# Patient Record
Sex: Male | Born: 1940 | Race: White | Hispanic: No | Smoking: Former smoker
Health system: Southern US, Community
[De-identification: ages and names within clinical notes are randomized; demographics above are authoritative.]

## PROBLEM LIST (undated history)

## (undated) DIAGNOSIS — I10 Essential (primary) hypertension: Secondary | ICD-10-CM

## (undated) DIAGNOSIS — I509 Heart failure, unspecified: Secondary | ICD-10-CM

## (undated) DIAGNOSIS — M1990 Unspecified osteoarthritis, unspecified site: Secondary | ICD-10-CM

## (undated) DIAGNOSIS — I4891 Unspecified atrial fibrillation: Secondary | ICD-10-CM

## (undated) DIAGNOSIS — N400 Enlarged prostate without lower urinary tract symptoms: Secondary | ICD-10-CM

## (undated) DIAGNOSIS — I2510 Atherosclerotic heart disease of native coronary artery without angina pectoris: Secondary | ICD-10-CM

## (undated) DIAGNOSIS — I219 Acute myocardial infarction, unspecified: Secondary | ICD-10-CM

## (undated) DIAGNOSIS — I739 Peripheral vascular disease, unspecified: Secondary | ICD-10-CM

## (undated) DIAGNOSIS — I255 Ischemic cardiomyopathy: Secondary | ICD-10-CM

## (undated) HISTORY — DX: Benign prostatic hyperplasia without lower urinary tract symptoms: N40.0

## (undated) HISTORY — DX: Atherosclerotic heart disease of native coronary artery without angina pectoris: I25.10

## (undated) HISTORY — PX: CORONARY ANGIOPLASTY: SHX604

## (undated) HISTORY — DX: Unspecified atrial fibrillation: I48.91

## (undated) HISTORY — PX: CATARACT EXTRACTION W/ INTRAOCULAR LENS  IMPLANT, BILATERAL: SHX1307

## (undated) HISTORY — PX: BACK SURGERY: SHX140

---

## 1985-05-27 HISTORY — PX: CORONARY ARTERY BYPASS GRAFT: SHX141

## 2001-04-16 ENCOUNTER — Encounter: Payer: Self-pay | Admitting: Neurosurgery

## 2001-04-20 ENCOUNTER — Ambulatory Visit (HOSPITAL_COMMUNITY): Admission: RE | Admit: 2001-04-20 | Discharge: 2001-04-21 | Payer: Self-pay | Admitting: Neurosurgery

## 2001-04-20 ENCOUNTER — Encounter: Payer: Self-pay | Admitting: Neurosurgery

## 2001-05-14 ENCOUNTER — Encounter (HOSPITAL_COMMUNITY): Admission: RE | Admit: 2001-05-14 | Discharge: 2001-06-13 | Payer: Self-pay | Admitting: Neurosurgery

## 2001-09-16 ENCOUNTER — Encounter: Payer: Self-pay | Admitting: Neurosurgery

## 2001-09-16 ENCOUNTER — Ambulatory Visit (HOSPITAL_COMMUNITY): Admission: RE | Admit: 2001-09-16 | Discharge: 2001-09-16 | Payer: Self-pay | Admitting: Neurosurgery

## 2002-06-03 ENCOUNTER — Encounter: Payer: Self-pay | Admitting: Cardiology

## 2009-01-05 ENCOUNTER — Ambulatory Visit (HOSPITAL_COMMUNITY): Admission: RE | Admit: 2009-01-05 | Discharge: 2009-01-05 | Payer: Self-pay | Admitting: Ophthalmology

## 2009-03-29 ENCOUNTER — Ambulatory Visit (HOSPITAL_COMMUNITY): Admission: RE | Admit: 2009-03-29 | Discharge: 2009-03-29 | Payer: Self-pay | Admitting: Ophthalmology

## 2010-01-31 ENCOUNTER — Encounter: Payer: Self-pay | Admitting: Cardiology

## 2010-07-20 ENCOUNTER — Encounter: Payer: Self-pay | Admitting: Cardiology

## 2010-07-24 ENCOUNTER — Encounter: Payer: Self-pay | Admitting: Cardiology

## 2010-07-24 DIAGNOSIS — R072 Precordial pain: Secondary | ICD-10-CM

## 2010-08-14 ENCOUNTER — Encounter: Payer: Self-pay | Admitting: *Deleted

## 2010-08-14 ENCOUNTER — Ambulatory Visit: Payer: Medicare Other | Admitting: Cardiology

## 2010-08-14 ENCOUNTER — Encounter (INDEPENDENT_AMBULATORY_CARE_PROVIDER_SITE_OTHER): Payer: Medicare Other | Admitting: Internal Medicine

## 2010-08-14 ENCOUNTER — Ambulatory Visit (INDEPENDENT_AMBULATORY_CARE_PROVIDER_SITE_OTHER): Payer: Medicare Other | Admitting: Cardiology

## 2010-08-14 ENCOUNTER — Encounter: Payer: Self-pay | Admitting: Cardiology

## 2010-08-14 ENCOUNTER — Encounter: Payer: Self-pay | Admitting: Internal Medicine

## 2010-08-14 VITALS — BP 136/84 | HR 80 | Resp 14

## 2010-08-14 DIAGNOSIS — I255 Ischemic cardiomyopathy: Secondary | ICD-10-CM | POA: Insufficient documentation

## 2010-08-14 DIAGNOSIS — I2589 Other forms of chronic ischemic heart disease: Secondary | ICD-10-CM

## 2010-08-14 DIAGNOSIS — E7800 Pure hypercholesterolemia, unspecified: Secondary | ICD-10-CM

## 2010-08-14 DIAGNOSIS — I2510 Atherosclerotic heart disease of native coronary artery without angina pectoris: Secondary | ICD-10-CM

## 2010-08-14 DIAGNOSIS — I25110 Atherosclerotic heart disease of native coronary artery with unstable angina pectoris: Secondary | ICD-10-CM | POA: Insufficient documentation

## 2010-08-14 DIAGNOSIS — R0989 Other specified symptoms and signs involving the circulatory and respiratory systems: Secondary | ICD-10-CM

## 2010-08-14 DIAGNOSIS — R9439 Abnormal result of other cardiovascular function study: Secondary | ICD-10-CM

## 2010-08-14 MED ORDER — ATORVASTATIN CALCIUM 40 MG PO TABS: 40.0000 mg | ORAL_TABLET | Freq: Every day | ORAL | Status: DC

## 2010-08-14 MED ORDER — CARVEDILOL 3.125 MG PO TABS: 3.1250 mg | ORAL_TABLET | Freq: Two times a day (BID) | ORAL | Status: DC

## 2010-08-14 MED ORDER — NITROGLYCERIN 0.4 MG SL SUBL: 0.4000 mg | SUBLINGUAL_TABLET | SUBLINGUAL | Status: DC | PRN

## 2010-08-14 NOTE — Patient Instructions (Addendum)
Your physician has requested that you have a cardiac catheterization. Cardiac catheterization is used to diagnose and/or treat various heart conditions. Doctors may recommend this procedure for a number of different reasons. The most common reason is to evaluate chest pain. Chest pain can be a symptom of coronary artery disease (CAD), and cardiac catheterization can show whether plaque is narrowing or blocking your heart's arteries. This procedure is also used to evaluate the valves, as well as measure the blood flow and oxygen levels in different parts of your heart. For further information please visit [email or web address] Please follow instruction sheet, as given. START CARVEDILOL 3.125MG  ONE TABLET TWICE DAILY START LIPITOR 40MG  ONCE DAILY RETURN FOR FASTING LAB WORK IN 6 WEEKS=LIPID/LIVER=272.0/V58.69 Your physician has requested that you have a lower extremity arterial duplex. This test is an ultrasound of the arteries in the legs or arms. It looks at arterial blood flow in the legs and arms. Allow one hour for Lower and Upper Arterial scans. There are no restrictions or special instructions

## 2010-08-14 NOTE — Assessment & Plan Note (Addendum)
As noted.  Plan cath.  Continue ASA.  Start coreg and give NTG prn.  Start Lipitor 40 mg daily.  Will need to check lipids.

## 2010-08-14 NOTE — Assessment & Plan Note (Signed)
Proceed with cath as noted.  Start beta blocker.  Eventually start ACE.  Will eventually need echo to further assess LVF.

## 2010-08-14 NOTE — Progress Notes (Signed)
History of Present Illness: 70 year old male, ex-smoker, with a history of coronary artery disease, status post CABG in 82.  He was admitted to [facility] 07/20/10 with chest discomfort.  He ruled out for myocardial infarction by enzymes.  Creatinine was normal at 0.94, alkaline phosphatase is elevated at 94, total bilirubin was elevated at 1.3, d-dimer was negative, BNP was normal at 97, hemoglobin was normal at 14.2.  Chest x-ray was normal.  He was set up for an outpatient stress test.  This was on February 28.  It demonstrated 1 mm ST segment depression in inferior leads during exercise.  Images demonstrated an EF of 37%, moderate global hypokinesis, large partially reversible, apical to basal-anterior/anteroseptal defect associated with severe hypokinesis, consistent with ischemia in addition to prior infarct.  There was also a medium fixed, mid to basal inferior defect associated with moderate hypokinesis.  He had recurrent chest symptoms last night and contacted his primary care provider.  He was added on to be seen today.  He noted some chest pressure that radiated up into his neck while out walking last month.  This prompted him to go to the hospital.  Since then he's basically been pain-free.  Last night, while bringing up a trash can, he developed recurrent chest pressure with radiation to his neck and associated shortness of breath.  He denies syncope, associated nausea or diaphoresis.  He denies any rest symptoms.  Although, he does report some symptoms of acid reflux.  He feels that these are clearly different symptoms than his anginal symptoms and he has noted relief with anti-acid.  Past Medical History  Diagnosis Date  . Chest pain   . Chronic headaches   . CAD (coronary artery disease)    Past Surgical History  Procedure Date  . Coronary artery bypass graft 1987    [place], [place]     Current Outpatient Prescriptions  Medication Sig Dispense Refill  . Aspirin (ASPIR-81 PO)  Take by mouth daily.        . fish oil-omega-3 fatty acids 1000 MG capsule Take 2 g by mouth daily.        . Multiple Vitamins-Minerals (CENTRUM SILVER PO) Take 2 tablets by mouth daily.          No Known Allergies  History  Substance Use Topics  . Smoking status: Former Smoker    Types: Cigarettes  . Smokeless tobacco: Never Used  . Alcohol Use: No   No family history on file.  ROS:  Please see history of present illness.  He denies any fevers, chills, cough, melena, hematochezia, weight changes, skin changes, her changes.  All other systems reviewed are negative.  Vital Signs: BP 136/84  Pulse 80  Resp 14  PHYSICAL EXAM: Well nourished, well developed, in no acute distress HEENT: normal Endocrine: No thyromegaly Lymphatics: No adenopathy Neck: no JVD Cardiac:  normal S1, S2; RRR; no murmur Lungs:  clear to auscultation bilaterally, no wheezing, rhonchi or rales Abd: soft, nontender, no hepatomegaly, No bruits Ext: no edema Skin: warm and dry Neuro:  CNs 2-12 intact, no focal abnormalities noted Vascular: No carotid bruits bilaterally; positive right femoral artery bruit  EKG:  Normal sinus rhythm, normal axis, heart rate 78, poor R-wave progression, no ischemic changes.

## 2010-08-14 NOTE — Assessment & Plan Note (Signed)
The patient requires cardiac catheterization.  Risks and benefits have been discussed with the patient and he agrees to proceed.  He will continue aspirin.  He will be given nitroglycerin.  We'll also start beta blocker.  Given his LV dysfunction, he will be placed on carvedilol.

## 2010-08-14 NOTE — Assessment & Plan Note (Signed)
Arrange ABIs after cath. completed.

## 2010-08-15 LAB — BASIC METABOLIC PANEL
BUN: 14 mg/dL (ref 6–23)
Chloride: 104 mEq/L (ref 96–112)
Creatinine, Ser: 1 mg/dL (ref 0.4–1.5)
Glucose, Bld: 98 mg/dL (ref 70–99)
Potassium: 4 mEq/L (ref 3.5–5.1)

## 2010-08-15 LAB — CBC WITH DIFFERENTIAL/PLATELET
Basophils Absolute: 0 10*3/uL (ref 0.0–0.1)
Eosinophils Absolute: 0.2 10*3/uL (ref 0.0–0.7)
HCT: 43.2 % (ref 39.0–52.0)
Lymphs Abs: 2.3 10*3/uL (ref 0.7–4.0)
MCV: 95.8 fl (ref 78.0–100.0)
Monocytes Absolute: 0.2 10*3/uL (ref 0.1–1.0)
Neutrophils Relative %: 70.5 % (ref 43.0–77.0)
Platelets: 309 10*3/uL (ref 150.0–400.0)
RDW: 12.6 % (ref 11.5–14.6)
WBC: 9.4 10*3/uL (ref 4.5–10.5)

## 2010-08-15 LAB — PROTIME-INR: Prothrombin Time: 13.5 s — ABNORMAL HIGH (ref 10.2–12.4)

## 2010-08-16 ENCOUNTER — Inpatient Hospital Stay (HOSPITAL_BASED_OUTPATIENT_CLINIC_OR_DEPARTMENT_OTHER)
Admission: RE | Admit: 2010-08-16 | Discharge: 2010-08-16 | Disposition: A | Discharge status: Home / Self Care | Payer: Medicare Other | Source: Ambulatory Visit | Attending: Cardiology | Admitting: Cardiology

## 2010-08-16 DIAGNOSIS — I2119 ST elevation (STEMI) myocardial infarction involving other coronary artery of inferior wall: Secondary | ICD-10-CM

## 2010-08-16 DIAGNOSIS — I2510 Atherosclerotic heart disease of native coronary artery without angina pectoris: Secondary | ICD-10-CM

## 2010-08-16 DIAGNOSIS — I209 Angina pectoris, unspecified: Secondary | ICD-10-CM | POA: Insufficient documentation

## 2010-08-16 DIAGNOSIS — I25810 Atherosclerosis of coronary artery bypass graft(s) without angina pectoris: Secondary | ICD-10-CM | POA: Insufficient documentation

## 2010-08-20 ENCOUNTER — Encounter: Payer: Self-pay | Admitting: Cardiology

## 2010-08-21 ENCOUNTER — Telehealth: Payer: Self-pay | Admitting: *Deleted

## 2010-08-21 NOTE — Telephone Encounter (Signed)
Message copied by Deliah Goody on Tue Aug 21, 2010  4:08 PM ------      Message from: Olga Millers      Created: Mon Aug 20, 2010  5:36 PM       ok

## 2010-08-21 NOTE — Telephone Encounter (Signed)
pt aware of results  

## 2010-08-22 NOTE — Progress Notes (Signed)
This encounter was created in error - please disregard.

## 2010-08-23 NOTE — Procedures (Signed)
NAME:  Hunter Townsend, WESTLING NO.:  [id number]  MEDICAL RECORD NO.:  [id number]           PATIENT TYPE:  LOCATION:                                 FACILITY:  PHYSICIAN:  Sarika Baldini M. [place], M.D.       DATE OF BIRTH:  DATE OF PROCEDURE:  08/16/2010 DATE OF DISCHARGE:                           CARDIAC CATHETERIZATION   INDICATIONS FOR PROCEDURE:  The patient is a 70 year old white male with history of remote coronary artery bypass surgery in 42 while in [place].  He presents recently with increased anginal symptoms.  A stress Cardiolite study showed evidence of anterior and anterior septal defect, which was partially reversible.  There was also a fixed inferior wall defect.  Ejection fraction was 37%.  Coronary angiography was recommended.  Of note, the patient reports that he had 4 bypass grafts at the time of his surgery, but we have no records concerning this operation, and the patient has no graft markers in place.PROCEDURE:  Left heart catheterization, coronary and left ventricular angiography, saphenous vein graft angiography, LIMA graft angiography, and RIMA angiography.  Access is via the right femoral artery using standard Seldinger technique.  EQUIPMENT:  4-French 4 cm left Judkins catheter, 4-French 3-D RCA catheter, 4-French pigtail catheter, 4-French LCB catheter, 4-French RCB catheter, 4-French left Amplatz one catheter.  CONTRAST:  165 mL of Omnipaque.  HEMODYNAMIC DATA:  Aortic pressure was 138/69 with a mean of 96 mmHg. Left ventricular pressure was 135 with an EDP of 18 mmHg.  ANGIOGRAPHIC DATA:  The left coronary artery arises and distributes normally.  The left main coronary artery has calcified plaque in the distal vessel with less than 20% irregularity.  The left anterior descending artery is heavily calcified.  It is occluded at its ostium.  The left circumflex coronary artery gives rise to a single large obtuse marginal vessel that  trifurcates into 3 moderate-sized branches.  There are mild irregularities in the proximal marginal less than 30%.  After giving rise to the first branch, there is a 50% to 60% stenosis in the mid OM.  The continuation of the left circumflex in the AV groove gives rise to excellent left-to-right collaterals to the entire right coronary artery.  The right coronary artery arises normally.  It is occluded immediately after the comments branch.  The LIMA graft to the LAD is widely patent with excellent runoff.  This fills the entire LAD which wraps around the apex.  We were unable to visualize any vein grafts coming off the aorta.  We did get into a knob somewhat posteriorly that appeared to be an old graft to the right coronary artery.  This was occluded.  Aortography demonstrated no evidence of vein grafts.  We did inject the right innominate and visualized the right internal mammary artery, which was not used as a graft, but was patent.  Left ventricular angiography was performed in the RAO view.  This demonstrates moderate anterior wall hypokinesia with global hypokinesia as well and overall ejection fraction of 40%.  FINAL INTERPRETATION: 1. Severe two-vessel obstructive atherosclerotic coronary artery     disease involving the occlusion of  the LAD and right coronary     artery. 2. Patent LIMA graft to the LAD. 3. Excellent collaterals to the entire right coronary artery in the     left circumflex coronary artery. 4. Moderate left ventricular dysfunction. 5. Occlusion of all saphenous vein grafts.  PLAN:  We would recommend continued medical therapy.          ______________________________ Gabor Lusk M. [place], M.D.     PMJ/MEDQ  D:  08/16/2010  T:  08/17/2010  Job:  [id number]  cc:   Madolyn Frieze. Jens Som, MD, [facility] Learta Codding, MD,FACC  Electronically Signed by Eliya Geiman [place] M.D. on 08/23/2010 01:05:31 PM

## 2010-09-02 LAB — BASIC METABOLIC PANEL
BUN: 14 mg/dL (ref 6–23)
Chloride: 104 mEq/L (ref 96–112)
Creatinine, Ser: 0.86 mg/dL (ref 0.4–1.5)
GFR calc non Af Amer: >60 mL/min (ref >60)
Glucose, Bld: 129 mg/dL — ABNORMAL HIGH (ref 70–99)
Potassium: 4.1 mEq/L (ref 3.5–5.1)

## 2010-09-05 ENCOUNTER — Encounter: Payer: Self-pay | Admitting: Cardiology

## 2010-09-05 ENCOUNTER — Ambulatory Visit (INDEPENDENT_AMBULATORY_CARE_PROVIDER_SITE_OTHER): Payer: Medicare Other | Admitting: Cardiology

## 2010-09-05 VITALS — BP 138/67 | HR 68 | Ht 65.0 in | Wt 180.8 lb

## 2010-09-05 DIAGNOSIS — I2589 Other forms of chronic ischemic heart disease: Secondary | ICD-10-CM

## 2010-09-05 DIAGNOSIS — I2510 Atherosclerotic heart disease of native coronary artery without angina pectoris: Secondary | ICD-10-CM

## 2010-09-05 DIAGNOSIS — I739 Peripheral vascular disease, unspecified: Secondary | ICD-10-CM

## 2010-09-05 DIAGNOSIS — I255 Ischemic cardiomyopathy: Secondary | ICD-10-CM

## 2010-09-05 DIAGNOSIS — E782 Mixed hyperlipidemia: Secondary | ICD-10-CM

## 2010-09-05 DIAGNOSIS — Z79899 Other long term (current) drug therapy: Secondary | ICD-10-CM

## 2010-09-05 NOTE — Assessment & Plan Note (Signed)
Possible diagnosis. Patient is describing some tingling and leg discomfort when he is walking, sometimes with standing. Could be a neuropathic component. I encouraged him to get back to his walking regimen gradually. We did discuss proceeding with ABIs if his symptoms persist.

## 2010-09-05 NOTE — Assessment & Plan Note (Signed)
No clear diagnosis, although placed on Lipitor previously, not able to tolerate. Plan followup fasting lipid profile to review. We may consider a different statin preparation

## 2010-09-05 NOTE — Patient Instructions (Signed)
Your physician wants you to follow-up in: 3 months. You will receive a reminder letter in the mail one-two months in advance. If you don't receive a letter, please call our office to schedule the follow-up appointment. Your physician recommends that you continue on your current medications as directed. Please refer to the Current Medication list given to you today. Your physician recommends that you go to the [facility] for a FASTING lipid profile and liver function labs. Do not eat or drink after midnight. If the results of your test are normal or stable, you will receive a letter. If they are abnormal, the nurse will contact you by phone.

## 2010-09-05 NOTE — Progress Notes (Signed)
FYI:  Wife just passed 5 months ago. --agh

## 2010-09-05 NOTE — Progress Notes (Signed)
Clinical Summary Hunter Townsend is a 70 y.o.male presenting for office followup. This is my first meeting with the patient. He presented to [facility] ER in late February with chest pain, had negative cardiac markers, and was discharged to follow up with Dr. Dimas Aguas. It seems that a followup Cardiolite was arranged as an outpatient, performed on 28 February, and read by Dr. Andee Lineman. The study was significantly abnormal, as noted below. Ultimately the patient was apparently then seen in our [facility] office by Dr. Jens Som in late March. There is no note for me to review at this time however. I did locate a cardiac catheterization report from 23 March, also noted below, showing 2 vessel CAD, patent LIMA to the LAD, and occluded vein grafts. The patient was managed medically.  Cardiac history is outlined below. He states that he has done fairly well over the years, with symptoms noted when he tried to get back to a walking regimen after not doing exercise for a year. His wife passed away within the last 6 months of cancer, and he has not been exercising until just recently.   He states that he has been feeling better, on medical therapy outlined. Was not able to tolerate Lipitor related to significant leg weakness.  Lab work from 20 March showed hemoglobin 15, platelets 309, INR 1.2, potassium 4.0, BUN 14, creatinine 1.0.  He does report tingling and some tightness in his legs, possibly related to claudication, although he does have a history of back surgery and some of his symptoms could be neuropathic.  No recent followup lipid profile.   No Known Allergies  Current outpatient prescriptions:Aspirin (ASPIR-81 PO), Take by mouth daily.  , Disp: , Rfl: ;  carvedilol (COREG) 3.125 MG tablet, Take 1 tablet (3.125 mg total) by mouth 2 (two) times daily., Disp: 60 tablet, Rfl: 11;  fish oil-omega-3 fatty acids 1000 MG capsule, Take 2 g by mouth daily.  , Disp: , Rfl: ;  isosorbide mononitrate (IMDUR) 30 MG 24 hr  tablet, Take 30 mg by mouth daily.  , Disp: , Rfl:  LORazepam (ATIVAN) 1 MG tablet, Take 1 tab nightly as needed for sleep , Disp: , Rfl: ;  Multiple Vitamins-Minerals (CENTRUM SILVER PO), Take 1 tablet by mouth daily. , Disp: , Rfl: ;  nitroGLYCERIN (NITROSTAT) 0.4 MG SL tablet, Place 1 tablet (0.4 mg total) under the tongue every 5 (five) minutes as needed for chest pain., Disp: 100 tablet, Rfl: 3 DISCONTD: atorvastatin (LIPITOR) 40 MG tablet, Take 1 tablet (40 mg total) by mouth daily., Disp: 30 tablet, Rfl: 11  Past Medical History  Diagnosis Date  . BPH (benign prostatic hyperplasia)   . CAD (coronary artery disease)     Multivessel, LIMA to LAD patent, SVGs (occluded,), collaterals to RCA, LVEF 40%    Social History Mr. Pozo reports that he quit smoking about 17 years ago. His smoking use included Cigarettes. He has never used smokeless tobacco. Mr. Maertens reports that he does not drink alcohol.  Review of Systems As noted above, otherwise reviewed and negative.  Physical Examination Filed Vitals:   09/05/10 0952  BP: 138/67  Pulse: 68  Normally nourished appearing male, no acute distress. HEENT: Conjunctiva and lids normal, oropharynx with moist mucosa. Neck: Supple, no elevated JVP or bruits. No thyromegaly. Lungs: Clear auscultation, nonlabored. Cardiac: Regular rate and rhythm, soft systolic murmur, split S2, no gallop. Abdomen: Soft, nontender, bowel sounds present. Skin: Warm and dry. Extremities: No pitting edema. Distal pulses 1+. Musculoskeletal:  No kyphosis. Neuropsychiatric: Alert and oriented x3, affect appropriate.  Studies Exercise Cardiolite 07/24/2010: Abnormal ST segment changes with baseline evidence of previous anteroseptal infarct. LVEF 37%. Large, partially reversible, apical the basal anterior/anteroseptal defect, as well as a medium, fixed mid to basal inferior defect.  Cardiac catheterization 08/17/2010: The left coronary artery arises and  distributes   normally.  The left main coronary artery has calcified plaque in the   distal vessel with less than 20% irregularity.      The left anterior descending artery is heavily calcified.  It is   occluded at its ostium.      The left circumflex coronary artery gives rise to a single large obtuse   marginal vessel that trifurcates into 3 moderate-sized branches.  There   are mild irregularities in the proximal marginal less than 30%.  After   giving rise to the first branch, there is a 50% to 60% stenosis in the   mid OM.  The continuation of the left circumflex in the AV groove gives   rise to excellent left-to-right collaterals to the entire right coronary   artery.      The right coronary artery arises normally.  It is occluded immediately   after the comments branch.      The LIMA graft to the LAD is widely patent with excellent runoff.  This   fills the entire LAD which wraps around the apex.      We were unable to visualize any vein grafts coming off the aorta.  We   did get into a knob somewhat posteriorly that appeared to be an old   graft to the right coronary artery.  This was occluded.  Aortography   demonstrated no evidence of vein grafts.  We did inject the right   innominate and visualized the right internal mammary artery, which was   not used as a graft, but was patent.      Left ventricular angiography was performed in the RAO view.  This   demonstrates moderate anterior wall hypokinesia with global hypokinesia   as well and overall ejection fraction of 40%.   Problem List and Plan

## 2010-09-05 NOTE — Assessment & Plan Note (Signed)
2 vessel obstructive disease by recent angiography, patent LIMA to LAD, and collateral filling of the occluded RCA, nonobstructive disease in the circumflex. Continue medical therapy and gradually increase activity.

## 2010-09-05 NOTE — Assessment & Plan Note (Signed)
LVEF approximately 40% by recent angiography, continue medical therapy.

## 2010-10-12 NOTE — Op Note (Signed)
[place]  Patient:    Hunter Townsend, CAPTAIN Visit Number: [id number] MRN: [id number]          Service Type: DSU Location: [id number] Attending Physician:  Gerald Dexter Dictated by:   Reinaldo Meeker, M.D. Proc. Date: 04/20/01 Admit Date:  04/20/2001                             Operative Report  PREOPERATIVE DIAGNOSIS:  Herniated disk L4-5 right, foraminal.  POSTOPERATIVE DIAGNOSIS:  Herniated disk L4-5 right, foraminal.  OPERATION PERFORMED:  Right L4-5 interlaminar laminotomy for excision of herniated disk with operating microscope.  SECONDARY PROCEDURE:  Microdissection L4 and L5 nerve roots and L4-5 disk and repair of dural tear.  SURGEON:  Reinaldo Meeker, M.D.  ANESTHESIA:  DESCRIPTION OF PROCEDURE:  After being placed in prone position, the patients back was prepped and draped in the usual sterile fashion.  Localizing x-ray was taken prior to incision to identify the appropriate level.  Midline incision was made above the spinous processes of L4 and L5.  Using the Bovie cutting current, the incision was carried down to the spinous processes. Subperiosteal dissection was then carried out on the right-sided spinous process and lamina and the McCullough self-retaining retractor was placed for exposure.  A second x-ray was taken to confirm approach to the L4-5 level and this was correct.  Using a high speed drill the inferior two thirds of the L4 lamina and the medial one third of the facet joint were removed. ____________ removal of bone towards the fragment was carried out as well. Ligamentum flavum was removed in a piecemeal fashion.  In the course of removing bone and the ligamentum flavum, a dural tear was formed.  This was repaired with one figure-of-eight 5-0 Prolene stitch.  Tisseal was placed on the area at the end of the case as well.  At this point microdissection technique was used to identify the lateral aspect  of the L4 nerve root, thecal sac and L5 nerve root.  Further coagulation was carried down the floor of the canal to identify the L4-5 disk.  The annulus was incised with a 15 blade and the disk space clean out with pituitary rongeurs and curets.  Inspection of the foramen slightly superior to the L4 nerve root revealed a large amount of subligamentous disk herniation material.  this was removed thoroughly and gave excellent decompression of the L4 nerve root.  At this point inspection was carried out in all directions for any evidence of residual compression and none could be identified.  Large amounts of irrigation were carried out and any bleeding controlled with bipolar coagulation and Gelfoam.  The wound was then closed using interrupted Vicryl on the muscle, fascia, subcutaneous and subcuticular tissues and Dermabond and Steri-Strips on the skin.  A sterile dressing was then applied and the patient was then extubated and taken to the recovery room in stable condition. Dictated by:   Reinaldo Meeker, M.D. Attending Physician:  Gerald Dexter DD:  04/20/01 TD:  04/20/01 Job: [id number]

## 2011-04-05 ENCOUNTER — Encounter: Payer: Self-pay | Admitting: Cardiology

## 2011-04-05 ENCOUNTER — Ambulatory Visit (INDEPENDENT_AMBULATORY_CARE_PROVIDER_SITE_OTHER): Payer: Medicare Other | Admitting: Cardiology

## 2011-04-05 VITALS — BP 150/87 | HR 83 | Ht 65.0 in | Wt 179.0 lb

## 2011-04-05 DIAGNOSIS — E782 Mixed hyperlipidemia: Secondary | ICD-10-CM

## 2011-04-05 DIAGNOSIS — I2510 Atherosclerotic heart disease of native coronary artery without angina pectoris: Secondary | ICD-10-CM

## 2011-04-05 DIAGNOSIS — I2589 Other forms of chronic ischemic heart disease: Secondary | ICD-10-CM

## 2011-04-05 DIAGNOSIS — I739 Peripheral vascular disease, unspecified: Secondary | ICD-10-CM

## 2011-04-05 DIAGNOSIS — I255 Ischemic cardiomyopathy: Secondary | ICD-10-CM

## 2011-04-05 DIAGNOSIS — I10 Essential (primary) hypertension: Secondary | ICD-10-CM | POA: Insufficient documentation

## 2011-04-05 MED ORDER — RAMIPRIL 5 MG PO CAPS: 5.0000 mg | ORAL_CAPSULE | Freq: Every day | ORAL | Status: DC

## 2011-04-05 NOTE — Progress Notes (Signed)
Clinical Summary [name] is a 70 y.o.male presenting for followup. He was seen back in April. He presents to the office after having recent symptoms of leg discomfort described as a hot tingling. This occurred originally after he took a routine walk, and has been less noticeable since that time. Also states that his blood pressure has been elevated. He reports compliance with his medications. No active angina symptoms.  Lab work from April showed AST 28, ALT 49, triglycerides 77, cholesterol 124, HDL 32, LDL 77. This was noted off statin therapy, prior intolerance to Lipitor.  Today we reviewed his medications, discussed workup for possible PAD, and also modifications for further afterload reduction and blood pressure control.  No Known Allergies  Medication list reviewed.  Past Medical History  Diagnosis Date  . BPH (benign prostatic hyperplasia)   . CAD (coronary artery disease)     Multivessel, LIMA to LAD patent, SVGs (occluded,), collaterals to RCA, LVEF 40%    Past Surgical History  Procedure Date  . Coronary artery bypass graft 1987    [place], [place] - LIMA to LAD, SVG to RCA presumably  . Back surgery     Social History Mr. Leatham reports that he quit smoking about 17 years ago. His smoking use included Cigarettes. He has a 80 pack-year smoking history. He has never used smokeless tobacco. Mr. Kopko reports that he does not drink alcohol.  Review of Systems No palpitations or dizziness. No orthopnea or PND. Otherwise negative.  Physical Examination Filed Vitals:   04/05/11 1447  BP: 150/87  Pulse: 83    Normally nourished appearing male, no acute distress.  HEENT: Conjunctiva and lids normal, oropharynx with moist mucosa.  Neck: Supple, no elevated JVP or bruits. No thyromegaly.  Lungs: Clear auscultation, nonlabored.  Cardiac: Regular rate and rhythm, soft systolic murmur, split S2, no gallop.  Abdomen: Soft, nontender, bowel sounds present.  Skin: Warm and  dry.  Extremities: No pitting edema. Distal pulses 1+.  Musculoskeletal: No kyphosis.  Neuropsychiatric: Alert and oriented x3, affect appropriate.    ECG Reviewed in EMR.   Problem List and Plan

## 2011-04-05 NOTE — Assessment & Plan Note (Signed)
No plan to add statin as yet in light of prior intolerance. We can readdress this over time.

## 2011-04-05 NOTE — Assessment & Plan Note (Signed)
Symptomatically stable without active angina. Continue medical therapy and observation. Encouraged regular exercise.

## 2011-04-05 NOTE — Assessment & Plan Note (Signed)
LVEF 40% by prior assessment. No active heart failure symptoms. Plan add ACE inhibitor to further round out regimen. Followup BMET in 2 weeks.

## 2011-04-05 NOTE — Patient Instructions (Signed)
   Lower extremity arterial dopplers with ABI's Your physician recommends that you go to the [facility] for lab work for [employer] in 2 weeks. If the results of your test are normal or stable, you will receive a letter.  If they are abnormal, the nurse will contact you by phone. Begin Altace 5mg  daily Follow up in  6 weeks - see above

## 2011-04-05 NOTE — Assessment & Plan Note (Signed)
Possible diagnosis, although has history of back problems and neuropathic pain. Plan will be to proceed with lower extremity Dopplers and ABI for further evaluation.

## 2011-04-05 NOTE — Assessment & Plan Note (Signed)
Blood pressure not optimally controlled. Altace 5 mg daily is being added.

## 2011-04-24 ENCOUNTER — Encounter (INDEPENDENT_AMBULATORY_CARE_PROVIDER_SITE_OTHER): Payer: Medicare Other | Admitting: *Deleted

## 2011-04-24 DIAGNOSIS — I70219 Atherosclerosis of native arteries of extremities with intermittent claudication, unspecified extremity: Secondary | ICD-10-CM

## 2011-04-24 DIAGNOSIS — I739 Peripheral vascular disease, unspecified: Secondary | ICD-10-CM

## 2011-04-30 ENCOUNTER — Telehealth: Payer: Self-pay | Admitting: *Deleted

## 2011-04-30 NOTE — Telephone Encounter (Signed)
Message copied by Eustace Moore on Tue Apr 30, 2011  4:03 PM ------      Message from: Eustace Moore      Created: Tue Apr 30, 2011  3:50 PM                   ----- Message -----         From: Homero Fellers Via, LPN         Sent: 04/29/2011   1:35 PM           To: Hoover Brunette, LPN            [place] pt.  Thanks      ----- Message -----         From: Jonelle Sidle, MD         Sent: 04/29/2011  12:39 PM           To: Homero Fellers Via, LPN            Reviewed reports. 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 In light of his symptoms of claudication, suggest referral to Dr. Excell Seltzer for [facility] evaluation.

## 2011-04-30 NOTE — Telephone Encounter (Signed)
Patient informed and appointment information given to patient for January 8th 11:00am arrive at 10:45am with Dr. Excell Seltzer at [facility]. Location.

## 2011-05-17 ENCOUNTER — Ambulatory Visit (INDEPENDENT_AMBULATORY_CARE_PROVIDER_SITE_OTHER): Payer: Medicare Other | Admitting: Cardiology

## 2011-05-17 ENCOUNTER — Encounter: Payer: Self-pay | Admitting: Cardiology

## 2011-05-17 VITALS — BP 106/65 | HR 76 | Ht 65.0 in | Wt 178.0 lb

## 2011-05-17 DIAGNOSIS — I2589 Other forms of chronic ischemic heart disease: Secondary | ICD-10-CM

## 2011-05-17 DIAGNOSIS — I10 Essential (primary) hypertension: Secondary | ICD-10-CM

## 2011-05-17 DIAGNOSIS — I255 Ischemic cardiomyopathy: Secondary | ICD-10-CM

## 2011-05-17 DIAGNOSIS — I739 Peripheral vascular disease, unspecified: Secondary | ICD-10-CM | POA: Insufficient documentation

## 2011-05-17 DIAGNOSIS — I2510 Atherosclerotic heart disease of native coronary artery without angina pectoris: Secondary | ICD-10-CM

## 2011-05-17 NOTE — Progress Notes (Signed)
Clinical Summary Mr. Wildes is a 70 y.o.male presenting for followup. He was seen in November. He reports doing relatively well, no angina or progressive shortness of breath. Has been walking for exercise. His blood pressure looks much better today.  Followup lab work from 11/19 showed BUN 11, creatinine 0.8, potassium 4.3. 10 he did undergo a lower extremity arterial Dopplers consistent with PAD, left ABI 0.62, and right ABI 1.1. Appointment with Dr. Excell Seltzer is pending in January. Symptomatically, still suspect that his left leg discomfort is perhaps a combination of vascular and neuropathic etiologies. He states that his left leg is shorter, and since using a lift in his shoe, he has had less pain on that side and in his lower back. Still does have some possible claudication and tingling in his left leg.  He reports a recent followup visit with Dr. Dimas Aguas.  No Known Allergies  Medication list reviewed.  Past Medical History  Diagnosis Date  . BPH (benign prostatic hyperplasia)   . CAD (coronary artery disease)     Multivessel, LIMA to LAD patent, SVGs (occluded,), collaterals to RCA, LVEF 40%    Past Surgical History  Procedure Date  . Coronary artery bypass graft 1987    [place], [place] - LIMA to LAD, SVG to RCA presumably  . Back surgery     Social History Mr. Hartshorn reports that he quit smoking about 17 years ago. His smoking use included Cigarettes. He has a 80 pack-year smoking history. He has never used smokeless tobacco. Mr. Fennell reports that he does not drink alcohol.  Review of Systems Negative except as outlined above.  Physical Examination Filed Vitals:   05/17/11 1017  BP: 106/65  Pulse: 76    Normally nourished appearing male, no acute distress.  HEENT: Conjunctiva and lids normal, oropharynx with moist mucosa.  Neck: Supple, no elevated JVP or bruits. No thyromegaly.  Lungs: Clear auscultation, nonlabored.  Cardiac: Regular rate and rhythm, soft systolic  murmur, split S2, no gallop.  Abdomen: Soft, nontender, bowel sounds present.  Skin: Warm and dry.  Extremities: No pitting edema. Distal pulses 1+.  Musculoskeletal: No kyphosis.  Neuropsychiatric: Alert and oriented x3, affect appropriate.     Problem List and Plan

## 2011-05-17 NOTE — Assessment & Plan Note (Signed)
Referral to Dr. Excell Seltzer pending in early January.

## 2011-05-17 NOTE — Assessment & Plan Note (Signed)
LVEF approximately 40%. Continue medical therapy.

## 2011-05-17 NOTE — Assessment & Plan Note (Signed)
Blood pressure control looks good today. 

## 2011-05-17 NOTE — Assessment & Plan Note (Signed)
Symptomatically stable, doing well, without active angina or heart failure symptoms. Plan to continue medical therapy and observation. Reinforced regular walking regimen.

## 2011-05-17 NOTE — Patient Instructions (Signed)
Your physician wants you to follow-up in: 6 months with Dr. Diona Browner. You will receive a reminder letter in the mail one-two months in advance. If you don't receive a letter, please call our office to schedule the follow-up appointment. Your physician recommends that you continue on your current medications as directed. Please refer to the Current Medication list given to you today.

## 2011-05-28 DIAGNOSIS — I219 Acute myocardial infarction, unspecified: Secondary | ICD-10-CM

## 2011-05-28 HISTORY — DX: Acute myocardial infarction, unspecified: I21.9

## 2011-06-04 ENCOUNTER — Encounter: Payer: Self-pay | Admitting: Cardiovascular Disease

## 2011-06-04 ENCOUNTER — Ambulatory Visit (INDEPENDENT_AMBULATORY_CARE_PROVIDER_SITE_OTHER): Payer: Medicare Other | Admitting: Cardiovascular Disease

## 2011-06-04 VITALS — BP 134/76 | HR 70 | Ht 65.0 in | Wt 177.0 lb

## 2011-06-04 DIAGNOSIS — I70219 Atherosclerosis of native arteries of extremities with intermittent claudication, unspecified extremity: Secondary | ICD-10-CM | POA: Diagnosis not present

## 2011-06-04 DIAGNOSIS — I739 Peripheral vascular disease, unspecified: Secondary | ICD-10-CM | POA: Diagnosis not present

## 2011-06-04 NOTE — Progress Notes (Signed)
HPI:  71 year old gentleman presenting for evaluation of peripheral arterial disease. The patient has coronary artery disease and ischemic cardiomyopathy, both problems followed by Dr. Diona Browner.  The patient underwent lower extremity duplex scanning and ABIs in November 2012. This demonstrated an ABI 1.1 on the right and 0.62 on the left. He was noted to have severe focal stenosis of the left superficial femoral artery about 10 cm above the knee with a peak velocity greater than 500 cm/s.  From a symptomatic standpoint, the patient complains of bilateral pins and needles sensation in his legs. He also describes a numbness in both feet. These symptoms affecting both legs equally. He describes a "heaviness" involving the left calf, only occurring with walking. He does not experience this pain at rest, nor does he experience in the right leg. He has no history of ulceration or nonhealing wounds.  The patient has occasional chest and shoulder tightness with emotional stress only. He does not have chest pain with physical exertion. He denies dyspnea, palpitations, edema, lightheadedness, or syncope. He has had a difficult last year following the passing of his wife.  Outpatient Encounter Prescriptions as of 06/04/2011  Medication Sig Dispense Refill  . Aspirin (ASPIR-81 PO) Take by mouth daily.        . carvedilol (COREG) 3.125 MG tablet Take 1 tablet (3.125 mg total) by mouth 2 (two) times daily.  60 tablet  11  . isosorbide mononitrate (IMDUR) 30 MG 24 hr tablet Take 30 mg by mouth daily.        Marland Kitchen LORazepam (ATIVAN) 1 MG tablet Take 1 tab nightly as needed for sleep       . Multiple Vitamins-Minerals (CENTRUM SILVER PO) Take 1 tablet by mouth daily.       . nitroGLYCERIN (NITROSTAT) 0.4 MG SL tablet Place 1 tablet (0.4 mg total) under the tongue every 5 (five) minutes as needed for chest pain.  100 tablet  3  . pantoprazole (PROTONIX) 40 MG tablet Take 1 tablet by mouth Daily.      . ramipril (ALTACE) 5 MG  capsule Take 1 capsule (5 mg total) by mouth daily.  30 capsule  6  . DISCONTD: meclizine (ANTIVERT) 25 MG tablet Take 0.5-1 tablets by mouth 4 times daily as needed.        Review of patient's allergies indicates no known allergies.  Past Medical History  Diagnosis Date  . BPH (benign prostatic hyperplasia)   . CAD (coronary artery disease)     Multivessel, LIMA to LAD patent, SVGs (occluded,), collaterals to RCA, LVEF 40%    Past Surgical History  Procedure Date  . Coronary artery bypass graft 1987    [place], [place] - LIMA to LAD, SVG to RCA presumably  . Back surgery     History   Social History  . Marital Status: Married    Spouse Name: N/A    Number of Children: N/A  . Years of Education: N/A   Occupational History  . Retired     Geophysicist/field seismologist   Social History Main Topics  . Smoking status: Former Smoker -- 2.0 packs/day for 40 years    Types: Cigarettes    Quit date: 05/27/1993  . Smokeless tobacco: Never Used  . Alcohol Use: No  . Drug Use: No  . Sexually Active: Not on file   Other Topics Concern  . Not on file   Social History Narrative  . No narrative on file    Family history: There is  no premature coronary artery disease in the family. There is no family history of aneurysm.  ROS: General: no fevers/chills/night sweats Eyes: no blurry vision, diplopia, or amaurosis ENT: no sore throat or hearing loss Resp: no cough, wheezing, or hemoptysis CV: no edema or palpitations GI: no abdominal pain, nausea, vomiting, diarrhea, or constipation GU: no dysuria, frequency, or hematuria Skin: no rash Neuro: no headache, numbness, tingling, or weakness of extremities Musculoskeletal: no joint pain or swelling Heme: no bleeding, DVT, or easy bruising Endo: no polydipsia or polyuria  BP 134/76  Pulse 70  Ht 5\' 5"  (1.651 m)  Wt 80.287 kg (177 lb)  BMI 29.45 kg/m2  PHYSICAL EXAM: Pt is alert and oriented, WD, WN, in no distress. HEENT: normal Neck: JVP  normal. Carotid upstrokes normal without bruits. No thyromegaly. Lungs: equal expansion, clear bilaterally CV: Apex is discrete and nondisplaced, RRR without murmur or gallop Abd: soft, NT, +BS, no bruit, no hepatosplenomegaly Back: no CVA tenderness Ext: no C/C/E        Femoral pulses 2+= with right femoral bruit        DP pulses 3+ on the right and 2+ on the left, PT pulses 2+ on the right and 1+ on the left Skin: warm and dry without rash Neuro: CNII-XII intact             Strength intact = bilaterally  ASSESSMENT AND PLAN:

## 2011-06-04 NOTE — Patient Instructions (Addendum)
Your physician wants you to follow-up in: 12 months. You will receive a reminder letter in the mail two months in advance. If you don't receive a letter, please call our office to schedule the follow-up appointment.  Your physician has requested that you have a lower extremity arterial duplex. This test is an ultrasound of the arteries in the legs or arms. It looks at arterial blood flow in the legs and arms. Allow one hour for Lower and Upper Arterial scans. There are no restrictions or special instructions. To be done in December 2013 prior to office visit with Dr. Excell Seltzer in January 2014. To be done in [place] office.

## 2011-06-04 NOTE — Assessment & Plan Note (Signed)
The patient has mild, lifestyle limiting claudication. This corresponds with the severe stenosis in the distal left superficial femoral artery. While the patient's ABI is moderately reduced, his left pedal pulses are easily palpable. In my opinion, his symptoms do not warrant consideration of angiography and PTCA. I think medical therapy is warranted. He is on appropriate medication with the use of aspirin for antiplatelet therapy and risk reduction medications per his medicine list. I would like to see the patient back in one year for followup with ABIs and repeat duplex scanning to proceed that visit. We discussed the potential for progressive symptoms and he will contact me if this occurs in the interim. The patient is not a candidate for Pletal because of his underlying cardiomyopathy.

## 2011-06-05 DIAGNOSIS — M999 Biomechanical lesion, unspecified: Secondary | ICD-10-CM | POA: Diagnosis not present

## 2011-06-05 DIAGNOSIS — M47817 Spondylosis without myelopathy or radiculopathy, lumbosacral region: Secondary | ICD-10-CM | POA: Diagnosis not present

## 2011-06-19 DIAGNOSIS — M999 Biomechanical lesion, unspecified: Secondary | ICD-10-CM | POA: Diagnosis not present

## 2011-06-19 DIAGNOSIS — M47817 Spondylosis without myelopathy or radiculopathy, lumbosacral region: Secondary | ICD-10-CM | POA: Diagnosis not present

## 2011-06-24 ENCOUNTER — Ambulatory Visit (HOSPITAL_COMMUNITY): Admit: 2011-06-24 | Payer: Self-pay | Admitting: Cardiovascular Disease

## 2011-06-24 ENCOUNTER — Other Ambulatory Visit: Payer: Self-pay

## 2011-06-24 ENCOUNTER — Encounter (HOSPITAL_COMMUNITY): Payer: Self-pay | Admitting: General Practice

## 2011-06-24 ENCOUNTER — Encounter (HOSPITAL_COMMUNITY)
Admission: AD | Disposition: A | Discharge status: Home / Self Care | Payer: Self-pay | Source: Other Acute Inpatient Hospital | Attending: Cardiovascular Disease

## 2011-06-24 ENCOUNTER — Inpatient Hospital Stay (HOSPITAL_COMMUNITY)
Admission: AD | Admit: 2011-06-24 | Discharge: 2011-06-25 | DRG: 247 | Disposition: A | Discharge status: Home / Self Care | Payer: Medicare Other | Source: Other Acute Inpatient Hospital | Attending: Cardiovascular Disease | Admitting: Cardiovascular Disease

## 2011-06-24 DIAGNOSIS — Z951 Presence of aortocoronary bypass graft: Secondary | ICD-10-CM | POA: Diagnosis not present

## 2011-06-24 DIAGNOSIS — I10 Essential (primary) hypertension: Secondary | ICD-10-CM | POA: Diagnosis present

## 2011-06-24 DIAGNOSIS — K219 Gastro-esophageal reflux disease without esophagitis: Secondary | ICD-10-CM | POA: Diagnosis present

## 2011-06-24 DIAGNOSIS — Z7982 Long term (current) use of aspirin: Secondary | ICD-10-CM

## 2011-06-24 DIAGNOSIS — N400 Enlarged prostate without lower urinary tract symptoms: Secondary | ICD-10-CM | POA: Diagnosis present

## 2011-06-24 DIAGNOSIS — I214 Non-ST elevation (NSTEMI) myocardial infarction: Secondary | ICD-10-CM | POA: Diagnosis not present

## 2011-06-24 DIAGNOSIS — Z79899 Other long term (current) drug therapy: Secondary | ICD-10-CM | POA: Diagnosis not present

## 2011-06-24 DIAGNOSIS — I739 Peripheral vascular disease, unspecified: Secondary | ICD-10-CM | POA: Diagnosis present

## 2011-06-24 DIAGNOSIS — I25810 Atherosclerosis of coronary artery bypass graft(s) without angina pectoris: Secondary | ICD-10-CM | POA: Diagnosis not present

## 2011-06-24 DIAGNOSIS — E782 Mixed hyperlipidemia: Secondary | ICD-10-CM | POA: Diagnosis present

## 2011-06-24 DIAGNOSIS — I2589 Other forms of chronic ischemic heart disease: Secondary | ICD-10-CM | POA: Diagnosis not present

## 2011-06-24 DIAGNOSIS — I2510 Atherosclerotic heart disease of native coronary artery without angina pectoris: Secondary | ICD-10-CM

## 2011-06-24 DIAGNOSIS — I25110 Atherosclerotic heart disease of native coronary artery with unstable angina pectoris: Secondary | ICD-10-CM | POA: Insufficient documentation

## 2011-06-24 DIAGNOSIS — I219 Acute myocardial infarction, unspecified: Secondary | ICD-10-CM

## 2011-06-24 DIAGNOSIS — R059 Cough, unspecified: Secondary | ICD-10-CM | POA: Diagnosis present

## 2011-06-24 DIAGNOSIS — I255 Ischemic cardiomyopathy: Secondary | ICD-10-CM | POA: Insufficient documentation

## 2011-06-24 DIAGNOSIS — R05 Cough: Secondary | ICD-10-CM | POA: Diagnosis present

## 2011-06-24 DIAGNOSIS — R51 Headache: Secondary | ICD-10-CM | POA: Diagnosis not present

## 2011-06-24 DIAGNOSIS — I70219 Atherosclerosis of native arteries of extremities with intermittent claudication, unspecified extremity: Secondary | ICD-10-CM | POA: Diagnosis present

## 2011-06-24 DIAGNOSIS — R0789 Other chest pain: Secondary | ICD-10-CM | POA: Diagnosis not present

## 2011-06-24 DIAGNOSIS — Z87891 Personal history of nicotine dependence: Secondary | ICD-10-CM | POA: Diagnosis not present

## 2011-06-24 HISTORY — DX: Essential (primary) hypertension: I10

## 2011-06-24 HISTORY — DX: Ischemic cardiomyopathy: I25.5

## 2011-06-24 HISTORY — DX: Unspecified osteoarthritis, unspecified site: M19.90

## 2011-06-24 HISTORY — PX: LEFT HEART CATHETERIZATION WITH CORONARY/GRAFT ANGIOGRAM: SHX5450

## 2011-06-24 HISTORY — DX: Peripheral vascular disease, unspecified: I73.9

## 2011-06-24 LAB — BASIC METABOLIC PANEL
Calcium: 8.9 mg/dL (ref 8.4–10.5)
Creatinine, Ser: 0.76 mg/dL (ref 0.50–1.35)
GFR calc non Af Amer: 89 mL/min — ABNORMAL LOW (ref >90)
Sodium: 144 mEq/L (ref 135–145)

## 2011-06-24 LAB — CBC
MCH: 32.3 pg (ref 26.0–34.0)
MCHC: 35.6 g/dL (ref 30.0–36.0)
MCV: 90.8 fL (ref 78.0–100.0)
Platelets: 259 10*3/uL (ref 150–400)

## 2011-06-24 LAB — PROTIME-INR
INR: 1.37 (ref 0.00–1.49)
Prothrombin Time: 17.1 seconds — ABNORMAL HIGH (ref 11.6–15.2)

## 2011-06-24 SURGERY — LEFT HEART CATHETERIZATION WITH CORONARY/GRAFT ANGIOGRAM
Anesthesia: LOCAL

## 2011-06-24 MED ORDER — ONDANSETRON HCL 4 MG/2ML IJ SOLN: 4.0000 mg | Freq: Four times a day (QID) | INTRAMUSCULAR | Status: DC | PRN

## 2011-06-24 MED ORDER — HEPARIN (PORCINE) IN NACL 2-0.9 UNIT/ML-% IJ SOLN
INTRAMUSCULAR | Status: AC
  Filled 2011-06-24: qty 2000

## 2011-06-24 MED ORDER — ACETAMINOPHEN 325 MG PO TABS: 650.0000 mg | ORAL_TABLET | ORAL | Status: DC | PRN

## 2011-06-24 MED ORDER — BIVALIRUDIN 250 MG IV SOLR
INTRAVENOUS | Status: AC
  Filled 2011-06-24: qty 250

## 2011-06-24 MED ORDER — PRASUGREL HCL 10 MG PO TABS
ORAL_TABLET | ORAL | Status: AC
  Administered 2011-06-25: 10 mg via ORAL
  Filled 2011-06-24: qty 6

## 2011-06-24 MED ORDER — HEPARIN SOD (PORCINE) IN D5W 100 UNIT/ML IV SOLN
900.0000 [IU]/h | INTRAVENOUS | Status: DC
  Administered 2011-06-24: 900 [IU]/h via INTRAVENOUS
  Filled 2011-06-24: qty 250

## 2011-06-24 MED ORDER — LIDOCAINE HCL (PF) 1 % IJ SOLN
INTRAMUSCULAR | Status: AC
  Filled 2011-06-24: qty 30

## 2011-06-24 MED ORDER — HEART ATTACK BOUNCING BOOK
Freq: Once | Status: AC
  Administered 2011-06-24: 1
  Filled 2011-06-24: qty 1

## 2011-06-24 MED ORDER — ISOSORBIDE MONONITRATE ER 30 MG PO TB24
30.0000 mg | ORAL_TABLET | Freq: Every day | ORAL | Status: DC
  Administered 2011-06-25: 30 mg via ORAL
  Filled 2011-06-24 (×2): qty 1

## 2011-06-24 MED ORDER — SODIUM CHLORIDE 0.9 % IV SOLN
INTRAVENOUS | Status: DC
  Administered 2011-06-24: 14:00:00 via INTRAVENOUS

## 2011-06-24 MED ORDER — ASPIRIN EC 81 MG PO TBEC
81.0000 mg | DELAYED_RELEASE_TABLET | Freq: Every day | ORAL | Status: DC
  Administered 2011-06-25: 81 mg via ORAL
  Filled 2011-06-24 (×2): qty 1

## 2011-06-24 MED ORDER — RAMIPRIL 5 MG PO CAPS
5.0000 mg | ORAL_CAPSULE | Freq: Every day | ORAL | Status: DC
  Administered 2011-06-25: 5 mg via ORAL
  Filled 2011-06-24 (×2): qty 1

## 2011-06-24 MED ORDER — NITROGLYCERIN 0.4 MG SL SUBL: 0.4000 mg | SUBLINGUAL_TABLET | SUBLINGUAL | Status: DC | PRN

## 2011-06-24 MED ORDER — SODIUM CHLORIDE 0.9 % IV SOLN
INTRAVENOUS | Status: AC
  Administered 2011-06-24 (×2): via INTRAVENOUS

## 2011-06-24 MED ORDER — LORAZEPAM 0.5 MG PO TABS: 1.0000 mg | ORAL_TABLET | Freq: Every evening | ORAL | Status: DC | PRN

## 2011-06-24 MED ORDER — PRASUGREL HCL 10 MG PO TABS
10.0000 mg | ORAL_TABLET | Freq: Every day | ORAL | Status: DC
  Administered 2011-06-25: 10 mg via ORAL
  Filled 2011-06-24 (×2): qty 1

## 2011-06-24 MED ORDER — PANTOPRAZOLE SODIUM 40 MG PO TBEC: 40.0000 mg | DELAYED_RELEASE_TABLET | Freq: Every day | ORAL | Status: DC

## 2011-06-24 MED ORDER — ADULT MULTIVITAMIN W/MINERALS CH
1.0000 | ORAL_TABLET | Freq: Every day | ORAL | Status: DC
  Administered 2011-06-25: 1 via ORAL
  Filled 2011-06-24 (×2): qty 1

## 2011-06-24 MED ORDER — FENTANYL CITRATE 0.05 MG/ML IJ SOLN
INTRAMUSCULAR | Status: AC
  Filled 2011-06-24: qty 2

## 2011-06-24 MED ORDER — NITROGLYCERIN 0.2 MG/ML ON CALL CATH LAB
INTRAVENOUS | Status: AC
  Filled 2011-06-24: qty 1

## 2011-06-24 MED ORDER — ASPIRIN 81 MG PO TBEC: 81.0000 mg | DELAYED_RELEASE_TABLET | Freq: Every day | ORAL | Status: DC

## 2011-06-24 MED ORDER — CENTRUM SILVER PO TABS: 1.0000 | ORAL_TABLET | Freq: Every day | ORAL | Status: DC

## 2011-06-24 MED ORDER — MIDAZOLAM HCL 2 MG/2ML IJ SOLN
INTRAMUSCULAR | Status: AC
  Filled 2011-06-24: qty 2

## 2011-06-24 MED ORDER — CARVEDILOL 3.125 MG PO TABS
3.1250 mg | ORAL_TABLET | Freq: Two times a day (BID) | ORAL | Status: DC
  Administered 2011-06-24: 3.125 mg via ORAL
  Filled 2011-06-24 (×4): qty 1

## 2011-06-24 NOTE — H&P (View-Only) (Signed)
Full History and physical in chart, [facility] hospital records. Pt with h/o of CAD s/p CABG remotely.  Cath last year with patent circumflex with collaterals to RCA. RCA and LAD are occluded. LIMA fills LAD. Chest pain last night. Troponin 0.48 at [facility]. [facility]. No recurrent chest pain today.

## 2011-06-24 NOTE — Progress Notes (Signed)
Site area: right groin  Site Prior to Removal:  Level 0  Pressure Applied For 25 MINUTES    Minutes Beginning at 1945  Manual:   yes  Patient Status During Pull:  VSS  Post Pull Groin Site:  Level 0  Post Pull Instructions Given:  yes  Post Pull Pulses Present:  yes  Dressing Applied:  yes  Comments:  stable

## 2011-06-24 NOTE — Interval H&P Note (Signed)
History and Physical Interval Note:  06/24/2011 4:16 PM  Hunter Townsend  has presented today for cardiac cath with the diagnosis of Chest pain  The various methods of treatment have been discussed with the patient and family. After consideration of risks, benefits and other options for treatment, the patient has consented to  Procedure(s): LEFT HEART CATHETERIZATION WITH CORONARY ANGIOGRAM as a surgical intervention .  The patients' history has been reviewed, patient examined, no change in status, stable for surgery.  I have reviewed the patients' chart and labs.  Questions were answered to the patient's satisfaction.     [name]

## 2011-06-24 NOTE — Progress Notes (Signed)
ANTICOAGULATION CONSULT NOTE - Initial Consult  Pharmacy Consult for  Heparin Indication: chest pain/ACS  No Known Allergies  Patient Measurements: Height: 5\' 5"  (165.1 cm) Weight: 178 lb (80.74 kg) IBW/kg (Calculated) : 61.5  Heparin Dosing Weight: 68 kg  Vital Signs: Temp: 98.4 F (36.9 C) (01/28 1100) Temp src: Oral (01/28 1100) BP: 138/80 mmHg (01/28 1100) Pulse Rate: 75  (01/28 1100)  Labs: No results found for this basename: HGB:2,HCT:3,PLT:3,APTT:3,LABPROT:3,INR:3,HEPARINUNFRC:3,CREATININE:3,CKTOTAL:3,CKMB:3,TROPONINI:3 in the last 72 hours Estimated Creatinine Clearance: 65.4 ml/min (by C-G formula based on Cr of 1).  Medical History: Past Medical History  Diagnosis Date  . BPH (benign prostatic hyperplasia)   . CAD (coronary artery disease)     Multivessel, LIMA to LAD patent, SVGs (occluded,), collaterals to RCA, LVEF 40%    Medications:  Prescriptions prior to admission  Medication Sig Dispense Refill  . Aspirin (ASPIR-81 PO) Take by mouth daily.        . carvedilol (COREG) 3.125 MG tablet Take 3.125 mg by mouth 2 (two) times daily.      . isosorbide mononitrate (IMDUR) 30 MG 24 hr tablet Take 30 mg by mouth daily.        Marland Kitchen LORazepam (ATIVAN) 1 MG tablet Take 1 mg by mouth at bedtime as needed. Take 1 tab nightly as needed for sleep      . Multiple Vitamins-Minerals (CENTRUM SILVER PO) Take 1 tablet by mouth daily.       . nitroGLYCERIN (NITROSTAT) 0.4 MG SL tablet Place 0.4 mg under the tongue every 5 (five) minutes as needed. For chest pain      . pantoprazole (PROTONIX) 40 MG tablet Take 1 tablet by mouth Daily.      . ramipril (ALTACE) 5 MG capsule Take 5 mg by mouth daily.      Marland Kitchen DISCONTD: carvedilol (COREG) 3.125 MG tablet Take 1 tablet (3.125 mg total) by mouth 2 (two) times daily.  60 tablet  11  . DISCONTD: nitroGLYCERIN (NITROSTAT) 0.4 MG SL tablet Place 1 tablet (0.4 mg total) under the tongue every 5 (five) minutes as needed for chest pain.  100  tablet  3  . DISCONTD: ramipril (ALTACE) 5 MG capsule Take 1 capsule (5 mg total) by mouth daily.  30 capsule  6    Assessment: 71 yo male admitted to [facility] hospital with complaints of chest pain.  He has a past medical history that reveals multiple heart vessel occlusions and has had past CABG 1987.  He also has documented peripheral arterial disease.  He was initiated on IV Heparin with a 4000 unit IV bolus and a drip at 900 units/hr at around 10 AM this morning.     Goal of Therapy:  Heparin level 0.3-0.7 units/ml   Plan:  1).  Continue current Heparin regimen. 2).  Check a heparin level to ensure therapeutic response. 3).  Monitor for bleeding complications.  Nadara Mustard, PharmD., MS Clinical Pharmacist [phone] = Pager 06/24/2011,1:22 PM

## 2011-06-24 NOTE — Op Note (Signed)
Cardiac Catheterization Operative Report  Hunter Townsend [id number] 1/28/20135:19 PM Criss Rosales, MD, MD  Procedure Performed:  1. Left Heart Catheterization 2. Selective Coronary Angiography 3. Left ventricular angiogram 4. LIMA graft angiography 5. PTCA/DES x 1 mid LAD beyond anastamosis of LIMA graft.  Operator: Verne Carrow, MD  Indication:  NSTEMI, known CAD. Last cath March 2012 with all vein grafts occluded, totally occluded ostial RCA and LAD. Moderate disease mid Circumflex. LIMA graft to LAD. RCA filled from left to right collaterals. To [facility] today with chest pain, troponin 0.48.                                      Procedure Details: The risks, benefits, complications, treatment options, and expected outcomes were discussed with the patient. The patient and/or family concurred with the proposed plan, giving informed consent. The patient was brought to the cath lab after IV hydration was begun and oral premedication was given. The patient was further sedated with Versed and Fentanyl. The right groin was prepped and draped in the usual manner. Using the modified Seldinger access technique, a 5 French sheath was placed in the right femoral artery. Standard diagnostic catheters were used to perform selective coronary angiography. A pigtail catheter was used to perform a left ventricular angiogram. The LIMA graft was selectively engaged and injected with an IMA catheter.   At this point in the case, I elected to proceed to intervention of the severe, hazy, thrombotic appearing stenosis in the mid LAD beyond the insertion of the graft. The sheath was upsized to a 6 [place] system. The patient was given a bolus of angiomax and a drip was started. The patient was given 60 mg of Effient po. I then used a 6 [place] IMA guide to engage the LIMA graft. When the ACT was greater than 200, I passed a Cougar IC wire down the LIMA graft and out into the native LAD. A 2.5 x 12  mm balloon was used to pre-dilate the stenosis x 2. I then deployed a 2.5 x 16 mm Promus Element DES in the mid LAD. The stent was post-dilated with a 2.75 x 12 mm [place] balloon. There was an excellent angiographic result.   There were no immediate complications. The patient was taken to the recovery area in stable condition.   Hemodynamic Findings: Central aortic pressure: 125/71 Left ventricular pressure: 125/8/12  Angiographic Findings:  Left main: Mild plaque.   Left Anterior Descending Artery: 100% ostial occlusion. The mid/distal and apical vessel fills from the LIMA graft. The mid LAD beyond the graft insertion has a 99% thrombotic appearing, hazy stenosis. This is felt to be the culprit.   Circumflex Artery: Moderate sized vessel with 30 diffuse proximal stenosis. The first OM is a large trifurcating vessel with serial 60% lesions beyond the first branching segment. The most superior and middle of the three branches off of the OM have ostial 40% stenoses. All of these stenotic areas are stable since cath last year. The AV groove Circumflex fills the RCA from collaterals.   Right Coronary Artery: 100% occlusion just beyond the ostium and takeoff of a large RV marginal branch.   LIMA graft to mid LAD:  Patent. The mid LAD beyond the graft insertion has a 99% thrombotic appearing, hazy stenosis.   Saphenous vein grafts: Known to be occluded and were not selectively injected.   Left Ventricular Angiogram:  LVEF 35-40%. Hypokinesis of the anterior wall and apex. Mild to moderate MR.  Impression: 1.  Triple vessel CAD s/p CABG 25 years ago. Known to have one patent graft (LIMA to LAD) and occlusion of all vein grafts by cath 2012.  2.  Severe stenosis mid LAD beyond graft insertion, likely culprit lesion leading to NSTEMI.  3.  Stable, moderately obstructive disease in Circumflex artery (obtuse marginal branch) 4. Successful PTCA/DES x 1 mid LAD beyond graft insertion.   Recommendations:  Continue ASA and Effient for at least one year.        Complications:  None. The patient tolerated the procedure well.

## 2011-06-24 NOTE — Progress Notes (Signed)
Full History and physical in chart, [facility] hospital records. Pt with h/o of CAD s/p CABG remotely.  Cath last year with patent circumflex with collaterals to RCA. RCA and LAD are occluded. LIMA fills LAD. Chest pain last night. Troponin 0.48 at [facility]. [facility]. No recurrent chest pain today.  

## 2011-06-25 ENCOUNTER — Encounter (HOSPITAL_COMMUNITY): Payer: Self-pay | Admitting: Physician Assistant

## 2011-06-25 DIAGNOSIS — I214 Non-ST elevation (NSTEMI) myocardial infarction: Principal | ICD-10-CM

## 2011-06-25 LAB — BASIC METABOLIC PANEL
BUN: 14 mg/dL (ref 6–23)
Calcium: 9.2 mg/dL (ref 8.4–10.5)
GFR calc Af Amer: >90 mL/min (ref >90)
GFR calc non Af Amer: 81 mL/min — ABNORMAL LOW (ref >90)
Glucose, Bld: 107 mg/dL — ABNORMAL HIGH (ref 70–99)
Potassium: 3.7 mEq/L (ref 3.5–5.1)

## 2011-06-25 LAB — CBC
HCT: 38.7 % — ABNORMAL LOW (ref 39.0–52.0)
MCH: 32 pg (ref 26.0–34.0)
MCHC: 35.1 g/dL (ref 30.0–36.0)
RDW: 12.8 % (ref 11.5–15.5)

## 2011-06-25 LAB — LIPID PANEL: LDL Cholesterol: 94 mg/dL (ref 0–99)

## 2011-06-25 LAB — POCT ACTIVATED CLOTTING TIME: Activated Clotting Time: 408 seconds

## 2011-06-25 MED ORDER — CARVEDILOL 6.25 MG PO TABS: 6.2500 mg | ORAL_TABLET | Freq: Two times a day (BID) | ORAL | Status: DC

## 2011-06-25 MED ORDER — SIMVASTATIN 20 MG PO TABS: 20.0000 mg | ORAL_TABLET | Freq: Every day | ORAL | Status: DC

## 2011-06-25 MED ORDER — SIMVASTATIN 20 MG PO TABS
20.0000 mg | ORAL_TABLET | Freq: Every day | ORAL | Status: DC
  Filled 2011-06-25: qty 1

## 2011-06-25 MED ORDER — PRASUGREL HCL 10 MG PO TABS: 10.0000 mg | ORAL_TABLET | Freq: Every day | ORAL | Status: DC

## 2011-06-25 MED ORDER — CARVEDILOL 6.25 MG PO TABS
6.2500 mg | ORAL_TABLET | Freq: Two times a day (BID) | ORAL | Status: DC
  Administered 2011-06-25: 6.25 mg via ORAL
  Filled 2011-06-25 (×3): qty 1

## 2011-06-25 MED FILL — Dextrose Inj 5%: INTRAVENOUS | Qty: 50 | Status: AC

## 2011-06-25 NOTE — Discharge Summary (Signed)
Full note written this am. cdm 

## 2011-06-25 NOTE — Progress Notes (Signed)
UR Completed. Simmons, Oliva Montecalvo F [phone]  

## 2011-06-25 NOTE — Progress Notes (Signed)
Pts blood pressure  Fluctuating 174/96 -188/103 at rest . Denies pain  Or discomforts. Hunter Townsend notified.all meds taken this am reviewed  orderd to continue monitor pt's b/p monitoring ,recheck in 1 hr and call for result

## 2011-06-25 NOTE — Discharge Summary (Signed)
Discharge Summary   Patient ID: Hunter Townsend MRN: [id number], DOB/AGE: 02-20-1941 71 y.o. Admit date: 06/24/2011 D/C date:     06/25/2011   Primary Discharge Diagnoses:  1. NSTEMI this admission with hx of CAD - s/p PTCA/DES x 1 to mid LAD 06/24/11, with stable disease otherwise (known patent LIMA-LAD and occlusion of all vein grafts) - CAD s/p CABG 1987 in [place], [place] 2. Ischemic cardiomyopathy  - EF 35-40% by cath 06/24/11 (EF 37% by nuclear stress test 06/2010) - may need consideration for ICD as outpatient - for f/u with Dr. Diona Browner 3. Dyslipidemia with Tchol 152, trig 150, HDL 28, LDL 94 - initiated on statin, will need f/u LFTs/lipids in 6 weeks 4. HTN  Secondary Discharge Diagnoses:  1. Peripheral arterial disease with decreased ABIs  (1.1 on R, 0.62 on L, for med rx for now) 2. BPH 3. Back Surgery  Hospital Course: 71 y/o M with hx of CAD/ICM presented to [facility] with complaints of chest pain while watching TV, radiating to his neck and shoulder. Initial troponin was negative but subsequent sets rose to 0.48 and he was subsequently transferred to [facility] for further evaluation. EKG showed ST depression in the inferior and inferolateral leads without any ST elevation. He remained on IV heparin and cardiac cath was recommended. Risks, benefits, and alternatives were discussed with the patient. Cath 06/24/11 showed triple vessel CAD s/p CABG 25 years ago with severe stenosis mid LAD beyond graft insertion, likely culprit lesion leading to NSTEMI with stable moderately obstructive disease in Circumflex artery (obtuse marginal branch). Dr. Clifton James performed successful PTCA/DES x 1 to mid LAD beyond graft insertion. He recommended ASA/Effient for at least 1 year. Today the patient is doing well. Dr. Clifton James uptitrated his BB therapy and added a statin. The patient was seen and examined today and felt stable for discharge by Dr. Clifton James. He may need consideration for ICD  placement as an outpatient.  Discharge Vitals: Blood pressure 142/78, pulse 69, temperature 98.3 F (36.8 C), temperature source Oral, resp. rate 21, height 5\' 5"  (1.651 m), weight 173 lb 8 oz (78.7 kg), SpO2 94.00%.  Labs: Lab Results  Component Value Date   WBC 8.6 06/25/2011   HGB 13.6 06/25/2011   HCT 38.7* 06/25/2011   MCV 91.1 06/25/2011   PLT 244 06/25/2011     Lab 06/25/11 0400  NA 143  K 3.7  CL 106  CO2 27  BUN 14  CREATININE 0.95  CALCIUM 9.2  PROT --  BILITOT --  ALKPHOS --  ALT --  AST --  GLUCOSE 107*   Lab Results  Component Value Date   CHOL 152 06/25/2011   HDL 28* 06/25/2011   LDLCALC 94 06/25/2011   TRIG 150* 06/25/2011    Diagnostic Studies/Procedures   1. 06/24/11 CXR at [facility] - no evidence of active pulmonary disease 2. Cardiac catheterization this admission, please see full report and above for summary.  Discharge Medications   Medication List  As of 06/25/2011  8:46 AM   TAKE these medications         aspirin 81 MG tablet   Take 81 mg by mouth daily.      carvedilol 6.25 MG tablet   Commonly known as: COREG   Take 1 tablet (6.25 mg total) by mouth 2 (two) times daily with a meal.      CENTRUM SILVER PO   Take 1 tablet by mouth daily.      isosorbide  mononitrate 30 MG 24 hr tablet   Commonly known as: IMDUR   Take 30 mg by mouth daily.      LORazepam 1 MG tablet   Commonly known as: ATIVAN   Take 1 mg by mouth at bedtime as needed. Take 1 tab nightly as needed for sleep      nitroGLYCERIN 0.4 MG SL tablet   Commonly known as: NITROSTAT   Place 0.4 mg under the tongue every 5 (five) minutes as needed. For chest pain      pantoprazole 40 MG tablet   Commonly known as: PROTONIX   Take 1 tablet by mouth Daily.      prasugrel 10 MG Tabs   Commonly known as: EFFIENT   Take 1 tablet (10 mg total) by mouth daily.      ramipril 5 MG capsule   Commonly known as: ALTACE   Take 5 mg by mouth daily.      simvastatin 20 MG tablet    Commonly known as: ZOCOR   Take 1 tablet (20 mg total) by mouth at bedtime.           Disposition   The patient will be discharged in stable condition to home. Discharge Orders    Future Appointments: Provider: Department: Dept Phone: Center:   07/11/2011 9:00 AM Jonelle Sidle, MD [facility] [phone] [facility]     Future Orders Please Complete By Expires   Diet - low sodium heart healthy      Increase activity slowly      Comments:   No driving for 1 week. No heavy lifting for 2 weeks. No sexual activity for 2 weeks. Keep procedure site clean & dry. If you notice increased pain, swelling, bleeding or pus, call/return!  You may shower, but no soaking baths/hot tubs/pools for 1 week.       Follow-up Information    Follow up with Nona Dell, MD. (07/11/11 at 9am)            Duration of Discharge Encounter: Greater than 30 minutes including physician and PA time.  Signed, Ronie Spies PA-C 06/25/2011, 8:46 AM

## 2011-06-25 NOTE — Progress Notes (Signed)
SUBJECTIVE: No chest pain this am. No SOB. No events overnight.   BP 142/78  Pulse 69  Temp(Src) 98.3 F (36.8 C) (Oral)  Resp 21  Ht 5\' 5"  (1.651 m)  Wt 175 lb 11.3 oz (79.7 kg)  BMI 29.24 kg/m2  SpO2 94%  Intake/Output Summary (Last 24 hours) at 06/25/11 1610 Last data filed at 06/24/11 2245  Gross per 24 hour  Intake 921.85 ml  Output    675 ml  Net 246.85 ml    PHYSICAL EXAM General: Well developed, well nourished, in no acute distress. Alert and oriented x 3.  Psych:  Good affect, responds appropriately Neck: No JVD. No masses noted.  Lungs: Clear bilaterally with no wheezes or rhonci noted.  Heart: RRR with no murmurs noted. Abdomen: Bowel sounds are present. Soft, non-tender.  Extremities: No lower extremity edema. Right groin cath site ok.   LABS: Basic Metabolic Panel:  Basename 06/25/11 0400 06/24/11 1524  NA 143 144  K 3.7 3.7  CL 106 108  CO2 27 26  GLUCOSE 107* 93  BUN 14 12  CREATININE 0.95 0.76  CALCIUM 9.2 8.9  MG -- --  PHOS -- --   CBC:  Basename 06/25/11 0400 06/24/11 1524  WBC 8.6 8.3  NEUTROABS -- --  HGB 13.6 13.3  HCT 38.7* 37.4*  MCV 91.1 90.8  PLT 244 259   Fasting Lipid Panel:  Basename 06/25/11 0400  CHOL 152  HDL 28*  LDLCALC 94  TRIG 960*  CHOLHDL 5.4  LDLDIRECT --    Current Meds:    . aspirin EC  81 mg Oral Daily  . bivalirudin      . carvedilol  3.125 mg Oral BID WC  . fentaNYL      . heart attack bouncing book   Does not apply Once  . heparin      . isosorbide mononitrate  30 mg Oral Daily  . lidocaine      . midazolam      . mulitivitamin with minerals  1 tablet Oral Daily  . nitroGLYCERIN      . pantoprazole  40 mg Oral Q1200  . prasugrel      . prasugrel  10 mg Oral Daily  . ramipril  5 mg Oral Daily  . DISCONTD: aspirin  81 mg Oral Daily  . DISCONTD: CENTRUM SILVER  1 tablet Per post-pyloric tube Daily     ASSESSMENT AND PLAN:  1. NSTEMI: Pt transferred from [facility] yesterday for  cath. Cardiac cath with severe stenosis mid LAD beyond the LIMA graft insertion. Now s/p DES x 1 mid LAD. Disease in other vessels is stable. He is now on ASA and Effient and will need dual antiplatelet therapy for at least one year.  Continue beta blocker. Will start low dose Simvastatin 20 mg po QHS.   2. PAD: outpt follow up.   3. HLD: start statin.   4. HTN: Increase Coreg to 6.25mg  po BID.  5. Disp: d/c home today. Follow up with Dr. Diona Browner in [place] office in 2-3 weeks.   Hunter Townsend  1/29/20136:42 AM

## 2011-06-25 NOTE — Progress Notes (Signed)
Pt walking independently without problems. No c/o. Ed completed and pt requests his name be sent to [facility].  [id number] Ethelda Chick CES, ACSM

## 2011-06-26 MED FILL — Heparin Sodium (Porcine) 100 Unt/ML in Sodium Chloride 0.45%: INTRAMUSCULAR | Qty: 250 | Status: AC

## 2011-07-11 ENCOUNTER — Encounter: Payer: Self-pay | Admitting: Cardiology

## 2011-07-11 ENCOUNTER — Ambulatory Visit (INDEPENDENT_AMBULATORY_CARE_PROVIDER_SITE_OTHER): Payer: Medicare Other | Admitting: Cardiology

## 2011-07-11 VITALS — BP 102/66 | HR 72 | Ht 65.0 in | Wt 170.0 lb

## 2011-07-11 DIAGNOSIS — E782 Mixed hyperlipidemia: Secondary | ICD-10-CM

## 2011-07-11 DIAGNOSIS — Z79899 Other long term (current) drug therapy: Secondary | ICD-10-CM | POA: Diagnosis not present

## 2011-07-11 DIAGNOSIS — I10 Essential (primary) hypertension: Secondary | ICD-10-CM

## 2011-07-11 DIAGNOSIS — I214 Non-ST elevation (NSTEMI) myocardial infarction: Secondary | ICD-10-CM | POA: Diagnosis not present

## 2011-07-11 DIAGNOSIS — I2510 Atherosclerotic heart disease of native coronary artery without angina pectoris: Secondary | ICD-10-CM | POA: Diagnosis not present

## 2011-07-11 NOTE — Patient Instructions (Signed)
Follow up as scheduled. Your physician recommends that you continue on your current medications as directed. Please refer to the Current Medication list given to you today. Your physician recommends that you go to the [facility] for a FASTING lipid profile and liver function labs. Do not eat or drink after midnight. DO LABS A FEW DAYS BEFORE APPOINTMENT IN April.

## 2011-07-11 NOTE — Assessment & Plan Note (Signed)
Followup FLP and LFT for next visit. Continue simvastatin.

## 2011-07-11 NOTE — Assessment & Plan Note (Signed)
Multivessel with graft disease status post recent DES to LAD. Continue DAPT. Followup arranged. LVEF was in the range of 35-40% around the time of his NSTEMI. Plan to reassess over time and determine if referral for consideration of ICD needed.

## 2011-07-11 NOTE — Assessment & Plan Note (Signed)
Patient clinically stable, no recurrent chest pain. He reports compliance with his medications including DAPT. Anticipate cardiac rehabilitation starting soon.

## 2011-07-11 NOTE — Progress Notes (Signed)
Clinical Summary Hunter Townsend is a 71 y.o.male presenting for post hospital followup. Record review finds NSTEMI back in January status post DES to LAD on 1/28, otherwise with documentation of stable disease including patent LIMA to LAD was occluded vein grafts. LVEF was in the range of 35-40%. Followup LDL was 94.  He states that he has been feeling fairly well since discharge. Has been gradually getting back to walking, anticipates starting cardiac rehabilitation soon.  He reports compliance with his medications, including DAPT. He did have a nosebleed recently with dry mucous membranes. Otherwise no bleeding problems.  No Known Allergies  Current Outpatient Prescriptions  Medication Sig Dispense Refill  . aspirin 81 MG tablet Take 81 mg by mouth daily.      . carvedilol (COREG) 6.25 MG tablet Take 1 tablet (6.25 mg total) by mouth 2 (two) times daily with a meal.  60 tablet  6  . isosorbide mononitrate (IMDUR) 30 MG 24 hr tablet Take 30 mg by mouth daily.        Marland Kitchen LORazepam (ATIVAN) 1 MG tablet Take 1 mg by mouth at bedtime as needed. Take 1 tab nightly as needed for sleep      . Multiple Vitamins-Minerals (CENTRUM SILVER PO) Take 1 tablet by mouth daily.       . nitroGLYCERIN (NITROSTAT) 0.4 MG SL tablet Place 0.4 mg under the tongue every 5 (five) minutes as needed. For chest pain      . pantoprazole (PROTONIX) 40 MG tablet Take 1 tablet by mouth Daily.      . prasugrel (EFFIENT) 10 MG TABS Take 1 tablet (10 mg total) by mouth daily.  30 tablet  11  . ramipril (ALTACE) 5 MG capsule Take 5 mg by mouth daily.      . simvastatin (ZOCOR) 20 MG tablet Take 1 tablet (20 mg total) by mouth at bedtime.  30 tablet  6    Past Medical History  Diagnosis Date  . BPH (benign prostatic hyperplasia)   . CAD (coronary artery disease)     s/p CABG 1987 in [place]. NSTEMI  s/p PTCA/DES x 1 to mid LAD 06/24/11, with stable disease otherwise (known patent LIMA-LAD and occlusion of all vein grafts)  .  Hypertension   . Arthritis   . Ischemic cardiomyopathy     EF 35-40% by cath 06/24/11 (EF 37% by nuclear stress test 06/2010).  Marland Kitchen PAD (peripheral artery disease)     Decreased ABIs  05/2011 - 1.1 on R, 0.62 on L, for med rx for now. F/u duplex planned for 04/2012 with Dr. Excell Seltzer appointment 05/2012.    Past Surgical History  Procedure Date  . Coronary artery bypass graft 1987    [place], [place] - LIMA to LAD, SVG to RCA presumably  . Back surgery     Social History Hunter Townsend reports that he quit smoking about 18 years ago. His smoking use included Cigarettes. He has a 80 pack-year smoking history. He has never used smokeless tobacco. Hunter Townsend reports that he does not drink alcohol.  Review of Systems No palpitations. Stable appetite. No melena or hematochezia. Otherwise negative except as outlined.  Physical Examination Filed Vitals:   07/11/11 0903  BP: 102/66  Pulse: 72   Normally nourished appearing male, no acute distress.  HEENT: Conjunctiva and lids normal, oropharynx with moist mucosa.  Neck: Supple, no elevated JVP or bruits. No thyromegaly.  Lungs: Clear auscultation, nonlabored.  Cardiac: Regular rate and rhythm, soft systolic murmur,  split S2, no gallop.  Abdomen: Soft, nontender, bowel sounds present.  Skin: Warm and dry.  Extremities: No pitting edema. Distal pulses 1+.  Musculoskeletal: No kyphosis.  Neuropsychiatric: Alert and oriented x3, affect appropriate.     Problem List and Plan

## 2011-07-11 NOTE — Assessment & Plan Note (Signed)
Blood pressure well-controlled today. 

## 2011-07-18 DIAGNOSIS — I252 Old myocardial infarction: Secondary | ICD-10-CM | POA: Diagnosis not present

## 2011-07-18 DIAGNOSIS — I2510 Atherosclerotic heart disease of native coronary artery without angina pectoris: Secondary | ICD-10-CM | POA: Diagnosis not present

## 2011-07-18 DIAGNOSIS — Z951 Presence of aortocoronary bypass graft: Secondary | ICD-10-CM | POA: Diagnosis not present

## 2011-07-18 DIAGNOSIS — I209 Angina pectoris, unspecified: Secondary | ICD-10-CM | POA: Diagnosis not present

## 2011-07-18 DIAGNOSIS — Z5189 Encounter for other specified aftercare: Secondary | ICD-10-CM | POA: Diagnosis not present

## 2011-07-22 DIAGNOSIS — I2510 Atherosclerotic heart disease of native coronary artery without angina pectoris: Secondary | ICD-10-CM | POA: Diagnosis not present

## 2011-07-22 DIAGNOSIS — Z951 Presence of aortocoronary bypass graft: Secondary | ICD-10-CM | POA: Diagnosis not present

## 2011-07-22 DIAGNOSIS — Z5189 Encounter for other specified aftercare: Secondary | ICD-10-CM | POA: Diagnosis not present

## 2011-07-22 DIAGNOSIS — I252 Old myocardial infarction: Secondary | ICD-10-CM | POA: Diagnosis not present

## 2011-07-22 DIAGNOSIS — I209 Angina pectoris, unspecified: Secondary | ICD-10-CM | POA: Diagnosis not present

## 2011-07-23 DIAGNOSIS — Z5189 Encounter for other specified aftercare: Secondary | ICD-10-CM | POA: Diagnosis not present

## 2011-07-23 DIAGNOSIS — I2510 Atherosclerotic heart disease of native coronary artery without angina pectoris: Secondary | ICD-10-CM | POA: Diagnosis not present

## 2011-07-23 DIAGNOSIS — I209 Angina pectoris, unspecified: Secondary | ICD-10-CM | POA: Diagnosis not present

## 2011-07-23 DIAGNOSIS — Z951 Presence of aortocoronary bypass graft: Secondary | ICD-10-CM | POA: Diagnosis not present

## 2011-07-23 DIAGNOSIS — I252 Old myocardial infarction: Secondary | ICD-10-CM | POA: Diagnosis not present

## 2011-07-25 ENCOUNTER — Telehealth: Payer: Self-pay | Admitting: Cardiology

## 2011-07-25 DIAGNOSIS — I252 Old myocardial infarction: Secondary | ICD-10-CM | POA: Diagnosis not present

## 2011-07-25 DIAGNOSIS — Z5189 Encounter for other specified aftercare: Secondary | ICD-10-CM | POA: Diagnosis not present

## 2011-07-25 DIAGNOSIS — I2510 Atherosclerotic heart disease of native coronary artery without angina pectoris: Secondary | ICD-10-CM | POA: Diagnosis not present

## 2011-07-25 DIAGNOSIS — Z951 Presence of aortocoronary bypass graft: Secondary | ICD-10-CM | POA: Diagnosis not present

## 2011-07-25 DIAGNOSIS — I209 Angina pectoris, unspecified: Secondary | ICD-10-CM | POA: Diagnosis not present

## 2011-07-25 NOTE — Telephone Encounter (Signed)
Pt has had dry, cough since starting Ramipril. He wants to know if he should change something.

## 2011-07-25 NOTE — Telephone Encounter (Signed)
Left message to call back on voice mail

## 2011-07-25 NOTE — Telephone Encounter (Signed)
Could switch from ramipril to Cozaar 50 mg daily.

## 2011-07-25 NOTE — Telephone Encounter (Signed)
Patient currently taking Ramipril 5mg  1x daily. Believes this is the cause of his symptoms.  Also has question about  Effrin.

## 2011-07-25 NOTE — Telephone Encounter (Signed)
Pt left message at 1520 asking for return call. Left message to call back on voicemail.

## 2011-07-29 DIAGNOSIS — Z5189 Encounter for other specified aftercare: Secondary | ICD-10-CM | POA: Diagnosis not present

## 2011-07-29 DIAGNOSIS — I2589 Other forms of chronic ischemic heart disease: Secondary | ICD-10-CM | POA: Diagnosis not present

## 2011-07-29 DIAGNOSIS — I2510 Atherosclerotic heart disease of native coronary artery without angina pectoris: Secondary | ICD-10-CM | POA: Diagnosis not present

## 2011-07-29 DIAGNOSIS — I10 Essential (primary) hypertension: Secondary | ICD-10-CM | POA: Diagnosis not present

## 2011-07-29 DIAGNOSIS — Z951 Presence of aortocoronary bypass graft: Secondary | ICD-10-CM | POA: Diagnosis not present

## 2011-07-29 DIAGNOSIS — I209 Angina pectoris, unspecified: Secondary | ICD-10-CM | POA: Diagnosis not present

## 2011-07-29 DIAGNOSIS — I252 Old myocardial infarction: Secondary | ICD-10-CM | POA: Diagnosis not present

## 2011-07-29 DIAGNOSIS — E785 Hyperlipidemia, unspecified: Secondary | ICD-10-CM | POA: Diagnosis not present

## 2011-07-29 DIAGNOSIS — Z9861 Coronary angioplasty status: Secondary | ICD-10-CM | POA: Diagnosis not present

## 2011-07-30 MED ORDER — LOSARTAN POTASSIUM 50 MG PO TABS: 50.0000 mg | ORAL_TABLET | Freq: Every day | ORAL | Status: DC

## 2011-07-30 NOTE — Telephone Encounter (Signed)
Left message to call back on voice mail

## 2011-07-30 NOTE — Telephone Encounter (Addendum)
Pt notified and verbalized understanding. He states he has (since Thurs) also been having some pressure in his nose/sinuses and nosebleeds. He is aware to see primary MD if these symptoms continue or worsen.

## 2011-08-26 DIAGNOSIS — Z951 Presence of aortocoronary bypass graft: Secondary | ICD-10-CM | POA: Diagnosis not present

## 2011-08-26 DIAGNOSIS — I10 Essential (primary) hypertension: Secondary | ICD-10-CM | POA: Diagnosis not present

## 2011-08-26 DIAGNOSIS — I2510 Atherosclerotic heart disease of native coronary artery without angina pectoris: Secondary | ICD-10-CM | POA: Diagnosis not present

## 2011-08-26 DIAGNOSIS — Z5189 Encounter for other specified aftercare: Secondary | ICD-10-CM | POA: Diagnosis not present

## 2011-08-26 DIAGNOSIS — I252 Old myocardial infarction: Secondary | ICD-10-CM | POA: Diagnosis not present

## 2011-08-26 DIAGNOSIS — I209 Angina pectoris, unspecified: Secondary | ICD-10-CM | POA: Diagnosis not present

## 2011-08-26 DIAGNOSIS — I2589 Other forms of chronic ischemic heart disease: Secondary | ICD-10-CM | POA: Diagnosis not present

## 2011-08-26 DIAGNOSIS — Z9861 Coronary angioplasty status: Secondary | ICD-10-CM | POA: Diagnosis not present

## 2011-08-26 DIAGNOSIS — E785 Hyperlipidemia, unspecified: Secondary | ICD-10-CM | POA: Diagnosis not present

## 2011-09-09 DIAGNOSIS — E782 Mixed hyperlipidemia: Secondary | ICD-10-CM | POA: Diagnosis not present

## 2011-09-09 DIAGNOSIS — Z79899 Other long term (current) drug therapy: Secondary | ICD-10-CM | POA: Diagnosis not present

## 2011-09-10 ENCOUNTER — Encounter: Payer: Self-pay | Admitting: *Deleted

## 2011-09-12 ENCOUNTER — Encounter: Payer: Self-pay | Admitting: Cardiology

## 2011-09-12 ENCOUNTER — Ambulatory Visit (INDEPENDENT_AMBULATORY_CARE_PROVIDER_SITE_OTHER): Payer: Medicare Other | Admitting: Cardiology

## 2011-09-12 VITALS — BP 132/73 | HR 65 | Ht 66.0 in | Wt 171.0 lb

## 2011-09-12 DIAGNOSIS — I10 Essential (primary) hypertension: Secondary | ICD-10-CM

## 2011-09-12 DIAGNOSIS — I255 Ischemic cardiomyopathy: Secondary | ICD-10-CM

## 2011-09-12 DIAGNOSIS — I2510 Atherosclerotic heart disease of native coronary artery without angina pectoris: Secondary | ICD-10-CM

## 2011-09-12 DIAGNOSIS — I2589 Other forms of chronic ischemic heart disease: Secondary | ICD-10-CM | POA: Diagnosis not present

## 2011-09-12 DIAGNOSIS — E782 Mixed hyperlipidemia: Secondary | ICD-10-CM | POA: Diagnosis not present

## 2011-09-12 DIAGNOSIS — I428 Other cardiomyopathies: Secondary | ICD-10-CM

## 2011-09-12 DIAGNOSIS — I779 Disorder of arteries and arterioles, unspecified: Secondary | ICD-10-CM

## 2011-09-12 DIAGNOSIS — I739 Peripheral vascular disease, unspecified: Secondary | ICD-10-CM

## 2011-09-12 NOTE — Assessment & Plan Note (Signed)
Followup echocardiogram is being arranged prior to next visit for reassessment of LV function.

## 2011-09-12 NOTE — Patient Instructions (Signed)
Follow up in 3 months. Your physician recommends that you continue on your current medications as directed. Please refer to the Current Medication list given to you today. Your physician has requested that you have an echocardiogram. Echocardiography is a painless test that uses sound waves to create images of your heart. It provides your doctor with information about the size and shape of your heart and how well your heart's chambers and valves are working. This procedure takes approximately one hour. There are no restrictions for this procedure. If the results of your test are normal or stable, you will receive a letter. If they are abnormal, the nurse will contact you by phone.

## 2011-09-12 NOTE — Assessment & Plan Note (Signed)
Symptomatically stable without angina on medical therapy. Continues on DAPT. Continue cardiac rehabilitation. Followup arranged.

## 2011-09-12 NOTE — Assessment & Plan Note (Signed)
No change in present regimen. He is tolerating Cozaar better than ACE inhibitor.

## 2011-09-12 NOTE — Progress Notes (Signed)
Clinical Summary Hunter Townsend is a 71 y.o.male presenting for followup. He was seen in February. He is involved in cardiac rehabilitation 3 times a week. Still reports left-sided claudication when he walks on a trail locally. He denies any angina or major bleeding problems. States that his cough has cleared up since switching to Cozaar.  Recent lab work reviewed showing normal LFTs, total cholesterol 116, LDL 64. And review these numbers today. He is tolerating simvastatin.  Today we discussed a plan for followup reassessment of LV function, to determine whether further consideration should be made for ICD.  No Known Allergies  Current Outpatient Prescriptions  Medication Sig Dispense Refill  . aspirin 81 MG tablet Take 81 mg by mouth daily.      . carvedilol (COREG) 6.25 MG tablet Take 1 tablet (6.25 mg total) by mouth 2 (two) times daily with a meal.  60 tablet  6  . isosorbide mononitrate (IMDUR) 30 MG 24 hr tablet Take 30 mg by mouth daily.        Marland Kitchen LORazepam (ATIVAN) 1 MG tablet Take 1 mg by mouth at bedtime as needed. Take 1 tab nightly as needed for sleep      . losartan (COZAAR) 50 MG tablet Take 1 tablet (50 mg total) by mouth daily.  30 tablet  6  . Multiple Vitamins-Minerals (CENTRUM SILVER PO) Take 1 tablet by mouth daily.       . pantoprazole (PROTONIX) 40 MG tablet Take 1 tablet by mouth Daily.      . prasugrel (EFFIENT) 10 MG TABS Take 1 tablet (10 mg total) by mouth daily.  30 tablet  11  . simvastatin (ZOCOR) 20 MG tablet Take 1 tablet (20 mg total) by mouth at bedtime.  30 tablet  6  . nitroGLYCERIN (NITROSTAT) 0.4 MG SL tablet Place 0.4 mg under the tongue every 5 (five) minutes as needed. For chest pain        Past Medical History  Diagnosis Date  . BPH (benign prostatic hyperplasia)   . Coronary atherosclerosis of native coronary artery     Multivessel, NSTEMI  s/p PTCA/DES to mid LAD 06/24/11,, known patent LIMA-LAD and occlusion of all vein grafts  . Hypertension    . Arthritis   . Ischemic cardiomyopathy     EF 35-40% by cath 06/24/11 (EF 37% by nuclear stress test 06/2010).  Marland Kitchen PAD (peripheral artery disease)     Decreased ABIs  05/2011 - 1.1 on R, 0.62 on L, for med rx for now. F/u duplex planned for 04/2012 with Dr. Excell Seltzer appointment 05/2012.    Past Surgical History  Procedure Date  . Coronary artery bypass graft 1987    [place], [place] - LIMA to LAD, SVG to RCA presumably  . Back surgery     Social History Hunter Townsend reports that he quit smoking about 18 years ago. His smoking use included Cigarettes. He has a 80 pack-year smoking history. He has never used smokeless tobacco. Hunter Townsend reports that he does not drink alcohol.  Review of Systems No palpitations, dizziness, syncope. Otherwise negative except as outlined.  Physical Examination Filed Vitals:   09/12/11 1339  BP: 132/73  Pulse: 65    Normally nourished appearing male, no acute distress.  HEENT: Conjunctiva and lids normal, oropharynx with moist mucosa.  Neck: Supple, no elevated JVP or bruits. No thyromegaly.  Lungs: Clear auscultation, nonlabored.  Cardiac: Regular rate and rhythm, soft systolic murmur, split S2, no gallop.  Abdomen: Soft,  nontender, bowel sounds present.  Skin: Warm and dry.  Extremities: No pitting edema. Distal pulses 1+.  Musculoskeletal: No kyphosis.  Neuropsychiatric: Alert and oriented x3, affect appropriate.     Problem List and Plan

## 2011-09-12 NOTE — Assessment & Plan Note (Signed)
Lipids are well-controlled on simvastatin.  

## 2011-09-12 NOTE — Assessment & Plan Note (Signed)
Still reports left-sided claudication. I encouraged continued walking regimen. We will likely proceed with followup ABIs prior to next year. He has seen Dr. Excell Seltzer as of January.

## 2011-09-16 DIAGNOSIS — M999 Biomechanical lesion, unspecified: Secondary | ICD-10-CM | POA: Diagnosis not present

## 2011-09-16 DIAGNOSIS — S239XXA Sprain of unspecified parts of thorax, initial encounter: Secondary | ICD-10-CM | POA: Diagnosis not present

## 2011-09-23 DIAGNOSIS — I739 Peripheral vascular disease, unspecified: Secondary | ICD-10-CM | POA: Diagnosis not present

## 2011-09-23 DIAGNOSIS — I10 Essential (primary) hypertension: Secondary | ICD-10-CM | POA: Diagnosis not present

## 2011-09-23 DIAGNOSIS — I209 Angina pectoris, unspecified: Secondary | ICD-10-CM | POA: Diagnosis not present

## 2011-09-23 DIAGNOSIS — H81399 Other peripheral vertigo, unspecified ear: Secondary | ICD-10-CM | POA: Diagnosis not present

## 2011-09-26 DIAGNOSIS — I209 Angina pectoris, unspecified: Secondary | ICD-10-CM | POA: Diagnosis not present

## 2011-09-26 DIAGNOSIS — I10 Essential (primary) hypertension: Secondary | ICD-10-CM | POA: Diagnosis not present

## 2011-09-26 DIAGNOSIS — I252 Old myocardial infarction: Secondary | ICD-10-CM | POA: Diagnosis not present

## 2011-09-26 DIAGNOSIS — E785 Hyperlipidemia, unspecified: Secondary | ICD-10-CM | POA: Diagnosis not present

## 2011-09-26 DIAGNOSIS — I2510 Atherosclerotic heart disease of native coronary artery without angina pectoris: Secondary | ICD-10-CM | POA: Diagnosis not present

## 2011-09-26 DIAGNOSIS — Z5189 Encounter for other specified aftercare: Secondary | ICD-10-CM | POA: Diagnosis not present

## 2011-09-26 DIAGNOSIS — I2589 Other forms of chronic ischemic heart disease: Secondary | ICD-10-CM | POA: Diagnosis not present

## 2011-09-26 DIAGNOSIS — Z951 Presence of aortocoronary bypass graft: Secondary | ICD-10-CM | POA: Diagnosis not present

## 2011-09-30 DIAGNOSIS — Z5189 Encounter for other specified aftercare: Secondary | ICD-10-CM | POA: Diagnosis not present

## 2011-09-30 DIAGNOSIS — I10 Essential (primary) hypertension: Secondary | ICD-10-CM | POA: Diagnosis not present

## 2011-09-30 DIAGNOSIS — I252 Old myocardial infarction: Secondary | ICD-10-CM | POA: Diagnosis not present

## 2011-09-30 DIAGNOSIS — Z951 Presence of aortocoronary bypass graft: Secondary | ICD-10-CM | POA: Diagnosis not present

## 2011-09-30 DIAGNOSIS — I2510 Atherosclerotic heart disease of native coronary artery without angina pectoris: Secondary | ICD-10-CM | POA: Diagnosis not present

## 2011-09-30 DIAGNOSIS — I209 Angina pectoris, unspecified: Secondary | ICD-10-CM | POA: Diagnosis not present

## 2011-10-01 DIAGNOSIS — I209 Angina pectoris, unspecified: Secondary | ICD-10-CM | POA: Diagnosis not present

## 2011-10-01 DIAGNOSIS — I10 Essential (primary) hypertension: Secondary | ICD-10-CM | POA: Diagnosis not present

## 2011-10-01 DIAGNOSIS — Z5189 Encounter for other specified aftercare: Secondary | ICD-10-CM | POA: Diagnosis not present

## 2011-10-01 DIAGNOSIS — I2510 Atherosclerotic heart disease of native coronary artery without angina pectoris: Secondary | ICD-10-CM | POA: Diagnosis not present

## 2011-10-01 DIAGNOSIS — Z951 Presence of aortocoronary bypass graft: Secondary | ICD-10-CM | POA: Diagnosis not present

## 2011-10-01 DIAGNOSIS — I252 Old myocardial infarction: Secondary | ICD-10-CM | POA: Diagnosis not present

## 2011-10-03 DIAGNOSIS — I10 Essential (primary) hypertension: Secondary | ICD-10-CM | POA: Diagnosis not present

## 2011-10-03 DIAGNOSIS — H43819 Vitreous degeneration, unspecified eye: Secondary | ICD-10-CM | POA: Diagnosis not present

## 2011-10-03 DIAGNOSIS — H35379 Puckering of macula, unspecified eye: Secondary | ICD-10-CM | POA: Diagnosis not present

## 2011-10-03 DIAGNOSIS — I252 Old myocardial infarction: Secondary | ICD-10-CM | POA: Diagnosis not present

## 2011-10-03 DIAGNOSIS — I2510 Atherosclerotic heart disease of native coronary artery without angina pectoris: Secondary | ICD-10-CM | POA: Diagnosis not present

## 2011-10-03 DIAGNOSIS — Z961 Presence of intraocular lens: Secondary | ICD-10-CM | POA: Diagnosis not present

## 2011-10-03 DIAGNOSIS — Z951 Presence of aortocoronary bypass graft: Secondary | ICD-10-CM | POA: Diagnosis not present

## 2011-10-03 DIAGNOSIS — Z5189 Encounter for other specified aftercare: Secondary | ICD-10-CM | POA: Diagnosis not present

## 2011-10-03 DIAGNOSIS — H5231 Anisometropia: Secondary | ICD-10-CM | POA: Diagnosis not present

## 2011-10-03 DIAGNOSIS — I209 Angina pectoris, unspecified: Secondary | ICD-10-CM | POA: Diagnosis not present

## 2011-10-07 DIAGNOSIS — I252 Old myocardial infarction: Secondary | ICD-10-CM | POA: Diagnosis not present

## 2011-10-07 DIAGNOSIS — I2510 Atherosclerotic heart disease of native coronary artery without angina pectoris: Secondary | ICD-10-CM | POA: Diagnosis not present

## 2011-10-07 DIAGNOSIS — Z951 Presence of aortocoronary bypass graft: Secondary | ICD-10-CM | POA: Diagnosis not present

## 2011-10-07 DIAGNOSIS — Z5189 Encounter for other specified aftercare: Secondary | ICD-10-CM | POA: Diagnosis not present

## 2011-10-07 DIAGNOSIS — I209 Angina pectoris, unspecified: Secondary | ICD-10-CM | POA: Diagnosis not present

## 2011-10-07 DIAGNOSIS — I10 Essential (primary) hypertension: Secondary | ICD-10-CM | POA: Diagnosis not present

## 2011-10-08 DIAGNOSIS — I2510 Atherosclerotic heart disease of native coronary artery without angina pectoris: Secondary | ICD-10-CM | POA: Diagnosis not present

## 2011-10-08 DIAGNOSIS — I10 Essential (primary) hypertension: Secondary | ICD-10-CM | POA: Diagnosis not present

## 2011-10-08 DIAGNOSIS — Z951 Presence of aortocoronary bypass graft: Secondary | ICD-10-CM | POA: Diagnosis not present

## 2011-10-08 DIAGNOSIS — I252 Old myocardial infarction: Secondary | ICD-10-CM | POA: Diagnosis not present

## 2011-10-08 DIAGNOSIS — Z5189 Encounter for other specified aftercare: Secondary | ICD-10-CM | POA: Diagnosis not present

## 2011-10-08 DIAGNOSIS — I209 Angina pectoris, unspecified: Secondary | ICD-10-CM | POA: Diagnosis not present

## 2011-10-10 DIAGNOSIS — Z951 Presence of aortocoronary bypass graft: Secondary | ICD-10-CM | POA: Diagnosis not present

## 2011-10-10 DIAGNOSIS — I209 Angina pectoris, unspecified: Secondary | ICD-10-CM | POA: Diagnosis not present

## 2011-10-10 DIAGNOSIS — Z5189 Encounter for other specified aftercare: Secondary | ICD-10-CM | POA: Diagnosis not present

## 2011-10-10 DIAGNOSIS — I252 Old myocardial infarction: Secondary | ICD-10-CM | POA: Diagnosis not present

## 2011-10-10 DIAGNOSIS — I10 Essential (primary) hypertension: Secondary | ICD-10-CM | POA: Diagnosis not present

## 2011-10-10 DIAGNOSIS — I2510 Atherosclerotic heart disease of native coronary artery without angina pectoris: Secondary | ICD-10-CM | POA: Diagnosis not present

## 2011-10-14 DIAGNOSIS — I2510 Atherosclerotic heart disease of native coronary artery without angina pectoris: Secondary | ICD-10-CM | POA: Diagnosis not present

## 2011-10-14 DIAGNOSIS — I10 Essential (primary) hypertension: Secondary | ICD-10-CM | POA: Diagnosis not present

## 2011-10-14 DIAGNOSIS — I252 Old myocardial infarction: Secondary | ICD-10-CM | POA: Diagnosis not present

## 2011-10-14 DIAGNOSIS — Z951 Presence of aortocoronary bypass graft: Secondary | ICD-10-CM | POA: Diagnosis not present

## 2011-10-14 DIAGNOSIS — Z5189 Encounter for other specified aftercare: Secondary | ICD-10-CM | POA: Diagnosis not present

## 2011-10-14 DIAGNOSIS — I209 Angina pectoris, unspecified: Secondary | ICD-10-CM | POA: Diagnosis not present

## 2011-10-16 ENCOUNTER — Other Ambulatory Visit (INDEPENDENT_AMBULATORY_CARE_PROVIDER_SITE_OTHER): Payer: Medicare Other

## 2011-10-16 ENCOUNTER — Other Ambulatory Visit: Payer: Self-pay

## 2011-10-16 DIAGNOSIS — I428 Other cardiomyopathies: Secondary | ICD-10-CM

## 2011-10-16 DIAGNOSIS — I2510 Atherosclerotic heart disease of native coronary artery without angina pectoris: Secondary | ICD-10-CM | POA: Diagnosis not present

## 2011-10-22 ENCOUNTER — Telehealth: Payer: Self-pay | Admitting: *Deleted

## 2011-10-22 NOTE — Telephone Encounter (Signed)
Message copied by Eustace Moore on Tue Oct 22, 2011  4:32 PM ------      Message from: MCDOWELL, Illene Bolus      Created: Wed Oct 16, 2011  1:17 PM       Reviewed report. Looks good overall with improvement in LVEF to the range of 50-55%. No longer a candidate for defibrillator. Would continue medical therapy.

## 2011-10-22 NOTE — Telephone Encounter (Signed)
Patient informed. 

## 2011-10-23 ENCOUNTER — Encounter: Payer: Self-pay | Admitting: Cardiovascular Disease

## 2011-10-23 ENCOUNTER — Encounter: Payer: Self-pay | Admitting: *Deleted

## 2011-10-23 ENCOUNTER — Ambulatory Visit (INDEPENDENT_AMBULATORY_CARE_PROVIDER_SITE_OTHER): Payer: Medicare Other | Admitting: Cardiovascular Disease

## 2011-10-23 VITALS — BP 100/57 | HR 72 | Ht 65.0 in | Wt 172.0 lb

## 2011-10-23 DIAGNOSIS — I70219 Atherosclerosis of native arteries of extremities with intermittent claudication, unspecified extremity: Secondary | ICD-10-CM | POA: Diagnosis not present

## 2011-10-23 LAB — CBC WITH DIFFERENTIAL/PLATELET
Basophils Absolute: 0 10*3/uL (ref 0.0–0.1)
Basophils Relative: 0.4 % (ref 0.0–3.0)
Hemoglobin: 13 g/dL (ref 13.0–17.0)
Lymphocytes Relative: 24.6 % (ref 12.0–46.0)
Monocytes Relative: 4.8 % (ref 3.0–12.0)
Neutro Abs: 5.5 10*3/uL (ref 1.4–7.7)
RBC: 3.96 Mil/uL — ABNORMAL LOW (ref 4.22–5.81)
WBC: 8 10*3/uL (ref 4.5–10.5)

## 2011-10-23 LAB — PROTIME-INR: INR: 1.3 ratio — ABNORMAL HIGH (ref 0.8–1.0)

## 2011-10-23 LAB — BASIC METABOLIC PANEL
BUN: 13 mg/dL (ref 6–23)
CO2: 26 mEq/L (ref 19–32)
Chloride: 108 mEq/L (ref 96–112)
Creatinine, Ser: 0.9 mg/dL (ref 0.4–1.5)

## 2011-10-23 NOTE — Patient Instructions (Signed)
Your physician has requested that you have a peripheral vascular angiogram. This exam is performed at the hospital. During this exam IV contrast is used to look at arterial blood flow. Please review the information sheet given for details. Scheduled for October 30, 2011

## 2011-10-24 ENCOUNTER — Encounter (HOSPITAL_COMMUNITY): Payer: Self-pay | Admitting: Pharmacy Technician

## 2011-10-29 ENCOUNTER — Encounter: Payer: Self-pay | Admitting: Cardiovascular Disease

## 2011-10-29 NOTE — Progress Notes (Signed)
 HPI:  71 year-old gentleman presents for follow-uup of lower extremity PAD. He has intermittent claudication of the left leg and has been treated medically. ABI's last year were 1.0 on the right and 0.6 on the left. Doppler studies were suggestive of severe distal left SFA stenosis (peak systolic velocity over 5 m/s).   The patient reports progressive left leg pain with ambulation, now occuring at short distance. He also complains of numbness in his left foot with ambulation. Symptoms are relieved after about 5 minutes of rest. He denies right leg symptoms. Denies rest pain or ulceration. No chest pain or dyspnea. No other complaints at present.  Outpatient Encounter Prescriptions as of 10/23/2011  Medication Sig Dispense Refill  . isosorbide mononitrate (IMDUR) 30 MG 24 hr tablet Take 30 mg by mouth daily.       . LORazepam (ATIVAN) 1 MG tablet Take 1 mg by mouth at bedtime as needed. for sleep      . Multiple Vitamins-Minerals (CENTRUM SILVER PO) Take 1 tablet by mouth daily.       . pantoprazole (PROTONIX) 40 MG tablet Take 1 tablet by mouth daily.       . DISCONTD: aspirin 81 MG tablet Take 81 mg by mouth daily.      . DISCONTD: carvedilol (COREG) 6.25 MG tablet Take 1 tablet (6.25 mg total) by mouth 2 (two) times daily with a meal.  60 tablet  6  . DISCONTD: losartan (COZAAR) 50 MG tablet Take 1 tablet (50 mg total) by mouth daily.  30 tablet  6  . DISCONTD: prasugrel (EFFIENT) 10 MG TABS Take 1 tablet (10 mg total) by mouth daily.  30 tablet  11  . DISCONTD: simvastatin (ZOCOR) 20 MG tablet Take 1 tablet (20 mg total) by mouth at bedtime.  30 tablet  6  . nitroGLYCERIN (NITROSTAT) 0.4 MG SL tablet Place 0.4 mg under the tongue every 5 (five) minutes as needed. For chest pain        No Known Allergies  Past Medical History  Diagnosis Date  . BPH (benign prostatic hyperplasia)   . Coronary atherosclerosis of native coronary artery     Multivessel, NSTEMI  s/p PTCA/DES to mid LAD  06/24/11,, known patent LIMA-LAD and occlusion of all vein grafts  . Hypertension   . Arthritis   . Ischemic cardiomyopathy     EF 35-40% by cath 06/24/11 (EF 37% by nuclear stress test 06/2010).  . PAD (peripheral artery disease)     Decreased ABIs  05/2011 - 1.1 on R, 0.62 on L, for med rx for now. F/u duplex planned for 04/2012 with Dr. Minela Bridgewater appointment 05/2012.    ROS: Negative except as per HPI  BP 100/57  Pulse 72  Ht 5' 5" (1.651 m)  Wt 78.019 kg (172 lb)  BMI 28.62 kg/m2  PHYSICAL EXAM: Pt is alert and oriented, NAD HEENT: normal Neck: JVP - normal, carotids 2+= without bruits Lungs: CTA bilaterally CV: RRR without murmur or gallop Abd: soft, NT, Positive BS, no hepatomegaly Ext: no C/C/E, femoral pulses 2+=, DP/PT pulses 2+ right and nonpalpable left Skin: warm/dry no rash  ASSESSMENT AND PLAN: Lifestyle limiting claudication. The patient has tried medical therapy and a walking program and has progressive symptoms. I have recommended angiography with an eye toward PTA and stenting if his anatomy is amenable. He is on dual antiplatelet Rx with ASA and effient as well as appropriate secondary risk reduction medication in the setting of his known   CAD. I reviewed the risks, indication, and alternatives to lower extremity angiography and PTA/stenting. He understands and agrees to proceed.  Florinda Taflinger 10/30/2011 12:02 AM       

## 2011-10-30 ENCOUNTER — Encounter (HOSPITAL_COMMUNITY)
Admission: RE | Disposition: A | Discharge status: Home / Self Care | Payer: Self-pay | Source: Ambulatory Visit | Attending: Cardiovascular Disease

## 2011-10-30 ENCOUNTER — Ambulatory Visit (HOSPITAL_COMMUNITY)
Admission: RE | Admit: 2011-10-30 | Discharge: 2011-10-30 | Disposition: A | Discharge status: Home / Self Care | Payer: Medicare Other | Source: Ambulatory Visit | Attending: Cardiovascular Disease | Admitting: Cardiovascular Disease

## 2011-10-30 ENCOUNTER — Other Ambulatory Visit: Payer: Self-pay | Admitting: Cardiovascular Disease

## 2011-10-30 DIAGNOSIS — I70219 Atherosclerosis of native arteries of extremities with intermittent claudication, unspecified extremity: Secondary | ICD-10-CM | POA: Insufficient documentation

## 2011-10-30 HISTORY — PX: LOWER EXTREMITY ANGIOGRAM: SHX5508

## 2011-10-30 LAB — POCT ACTIVATED CLOTTING TIME: Activated Clotting Time: 221 seconds

## 2011-10-30 SURGERY — ANGIOGRAM, LOWER EXTREMITY
Anesthesia: LOCAL

## 2011-10-30 MED ORDER — SODIUM CHLORIDE 0.9 % IJ SOLN: 3.0000 mL | Freq: Two times a day (BID) | INTRAMUSCULAR | Status: DC

## 2011-10-30 MED ORDER — HEPARIN (PORCINE) IN NACL 2-0.9 UNIT/ML-% IJ SOLN
INTRAMUSCULAR | Status: AC
  Filled 2011-10-30: qty 1000

## 2011-10-30 MED ORDER — SODIUM CHLORIDE 0.9 % IV SOLN: 250.0000 mL | INTRAVENOUS | Status: DC

## 2011-10-30 MED ORDER — MIDAZOLAM HCL 2 MG/2ML IJ SOLN
INTRAMUSCULAR | Status: AC
  Filled 2011-10-30: qty 2

## 2011-10-30 MED ORDER — ASPIRIN 81 MG PO CHEW
324.0000 mg | CHEWABLE_TABLET | ORAL | Status: AC
  Administered 2011-10-30: 324 mg via ORAL
  Filled 2011-10-30: qty 4

## 2011-10-30 MED ORDER — SODIUM CHLORIDE 0.9 % IV SOLN: 250.0000 mL | INTRAVENOUS | Status: DC | PRN

## 2011-10-30 MED ORDER — DIAZEPAM 5 MG PO TABS
5.0000 mg | ORAL_TABLET | ORAL | Status: AC
  Administered 2011-10-30: 5 mg via ORAL
  Filled 2011-10-30: qty 1

## 2011-10-30 MED ORDER — SODIUM CHLORIDE 0.9 % IJ SOLN: 3.0000 mL | INTRAMUSCULAR | Status: DC | PRN

## 2011-10-30 MED ORDER — ACETAMINOPHEN 325 MG PO TABS: 650.0000 mg | ORAL_TABLET | ORAL | Status: DC | PRN

## 2011-10-30 MED ORDER — LIDOCAINE HCL (PF) 1 % IJ SOLN
INTRAMUSCULAR | Status: AC
  Filled 2011-10-30: qty 30

## 2011-10-30 MED ORDER — PROTAMINE SULFATE 10 MG/ML IV SOLN
INTRAVENOUS | Status: AC
  Filled 2011-10-30: qty 5

## 2011-10-30 MED ORDER — SODIUM CHLORIDE 0.9 % IV SOLN
INTRAVENOUS | Status: DC
  Administered 2011-10-30: 08:00:00 via INTRAVENOUS

## 2011-10-30 MED ORDER — ONDANSETRON HCL 4 MG/2ML IJ SOLN: 4.0000 mg | Freq: Four times a day (QID) | INTRAMUSCULAR | Status: DC | PRN

## 2011-10-30 MED ORDER — SODIUM CHLORIDE 0.9 % IV SOLN: 1.0000 mL/kg/h | INTRAVENOUS | Status: DC

## 2011-10-30 MED ORDER — FENTANYL CITRATE 0.05 MG/ML IJ SOLN
INTRAMUSCULAR | Status: AC
  Filled 2011-10-30: qty 2

## 2011-10-30 NOTE — H&P (View-Only) (Signed)
HPI:  71 year-old gentleman presents for follow-uup of lower extremity PAD. He has intermittent claudication of the left leg and has been treated medically. ABI's last year were 1.0 on the right and 0.6 on the left. Doppler studies were suggestive of severe distal left SFA stenosis (peak systolic velocity over 5 m/s).   The patient reports progressive left leg pain with ambulation, now occuring at short distance. He also complains of numbness in his left foot with ambulation. Symptoms are relieved after about 5 minutes of rest. He denies right leg symptoms. Denies rest pain or ulceration. No chest pain or dyspnea. No other complaints at present.  Outpatient Encounter Prescriptions as of 10/23/2011  Medication Sig Dispense Refill  . isosorbide mononitrate (IMDUR) 30 MG 24 hr tablet Take 30 mg by mouth daily.       Marland Kitchen LORazepam (ATIVAN) 1 MG tablet Take 1 mg by mouth at bedtime as needed. for sleep      . Multiple Vitamins-Minerals (CENTRUM SILVER PO) Take 1 tablet by mouth daily.       . pantoprazole (PROTONIX) 40 MG tablet Take 1 tablet by mouth daily.       Marland Kitchen DISCONTD: aspirin 81 MG tablet Take 81 mg by mouth daily.      Marland Kitchen DISCONTD: carvedilol (COREG) 6.25 MG tablet Take 1 tablet (6.25 mg total) by mouth 2 (two) times daily with a meal.  60 tablet  6  . DISCONTD: losartan (COZAAR) 50 MG tablet Take 1 tablet (50 mg total) by mouth daily.  30 tablet  6  . DISCONTD: prasugrel (EFFIENT) 10 MG TABS Take 1 tablet (10 mg total) by mouth daily.  30 tablet  11  . DISCONTD: simvastatin (ZOCOR) 20 MG tablet Take 1 tablet (20 mg total) by mouth at bedtime.  30 tablet  6  . nitroGLYCERIN (NITROSTAT) 0.4 MG SL tablet Place 0.4 mg under the tongue every 5 (five) minutes as needed. For chest pain        No Known Allergies  Past Medical History  Diagnosis Date  . BPH (benign prostatic hyperplasia)   . Coronary atherosclerosis of native coronary artery     Multivessel, NSTEMI  s/p PTCA/DES to mid LAD  06/24/11,, known patent LIMA-LAD and occlusion of all vein grafts  . Hypertension   . Arthritis   . Ischemic cardiomyopathy     EF 35-40% by cath 06/24/11 (EF 37% by nuclear stress test 06/2010).  Marland Kitchen PAD (peripheral artery disease)     Decreased ABIs  05/2011 - 1.1 on R, 0.62 on L, for med rx for now. F/u duplex planned for 04/2012 with Dr. Excell Seltzer appointment 05/2012.    ROS: Negative except as per HPI  BP 100/57  Pulse 72  Ht 5\' 5"  (1.651 m)  Wt 78.019 kg (172 lb)  BMI 28.62 kg/m2  PHYSICAL EXAM: Pt is alert and oriented, NAD HEENT: normal Neck: JVP - normal, carotids 2+= without bruits Lungs: CTA bilaterally CV: RRR without murmur or gallop Abd: soft, NT, Positive BS, no hepatomegaly Ext: no C/C/E, femoral pulses 2+=, DP/PT pulses 2+ right and nonpalpable left Skin: warm/dry no rash  ASSESSMENT AND PLAN: Lifestyle limiting claudication. The patient has tried medical therapy and a walking program and has progressive symptoms. I have recommended angiography with an eye toward PTA and stenting if his anatomy is amenable. He is on dual antiplatelet Rx with ASA and effient as well as appropriate secondary risk reduction medication in the setting of his known  CAD. I reviewed the risks, indication, and alternatives to lower extremity angiography and PTA/stenting. He understands and agrees to proceed.  Tonny Bollman 10/30/2011 12:02 AM

## 2011-10-30 NOTE — Discharge Instructions (Signed)

## 2011-10-30 NOTE — Interval H&P Note (Signed)
History and Physical Interval Note:  10/30/2011 9:57 AM  Hunter Townsend  has presented today for surgery, with the diagnosis of claudication  The various methods of treatment have been discussed with the patient and family. After consideration of risks, benefits and other options for treatment, the patient has consented to  Procedure(s) (LRB): LOWER EXTREMITY ANGIOGRAM (N/A) as a surgical intervention .  The patients' history has been reviewed, patient examined, no change in status, stable for surgery.  I have reviewed the patients' chart and labs.  Questions were answered to the patient's satisfaction.     Tonny Bollman  10/30/2011 9:57 AM

## 2011-10-30 NOTE — CV Procedure (Signed)
Cardiac Catheterization Procedure Note  Name: Hunter Townsend MRN: [id number] DOB: [birth year]  Procedure: Abdominal aortic angiogram, left external iliac angiogram with runoff, right external iliac angiogram with runoff, left SFA PTA and stenting  Indication: Severe intermittent claudication of the left leg  Procedural details: The right groin was prepped, draped, and anesthetized with 1% lidocaine. Using the modified Seldinger technique, a 5 French sheath was placed in the right femoral artery. A 5 French pigtail catheter was advanced into the suprarenal abdominal aortogram and abdominal aortography was performed. A crossover catheter was then used to access the left iliac artery. A bursa core wire was used to change up to an endhole catheter was which was advanced into the left external iliac artery. A left external iliac angiogram with runoff to the left foot was performed. This demonstrated severe stenosis of the distal left SFA which correlated with the patient's findings on his noninvasive studies. The sheath was changed out to a 6 [place] Terumo destination sheath which was advanced into the left external iliac artery. Weight-based unfractionated heparin was administered. The bursa core wire was used to cross the lesion. I attempted to primarily stent the lesion with a 7 x 60 mm ever flex Protg self-expanding stent. However, the stent would not cross the lesion. There was severe 90% stenosis and tortuosity at the lesion site. I then tried to cross with a pre-dilatation balloon but it also would not cross. At that point a 0.035 inch guidewire was changed out to a 0.014 inch wire. A 4 x 40 mm sterling balloon was then advanced successfully across the lesion and predilated to 10 atmospheres. The stent was readvanced successfully across the lesion and deployed. The stent was then postdilated with a 6 x 40 mm post dilatation balloon with good stent expansion and 0% residual stenosis. Final  angiography demonstrated an excellent result. An angiogram over the tibial vessels showed continued excellent flow with no evidence of distal embolization. The wire was removed and the destination sheath was pulled back into the ipsilateral right external iliac artery where a right external iliac angiogram with runoff to the right foot was performed. Finally, a Perclose device was used for femoral hemostasis, but the device was unsuccessful. Manual pressure will be used for hemostasis. Initially, there is no hematoma present. The patient was given 40 units of protamine to reverse his heparin. A total heparin dose of 7000 units was given.  Procedural findings:  Abdominal aorta: Patent without evidence of aneurysm or significant stenosis. There are single renal arteries bilaterally, both of which are widely patent.  Left leg: The common, external, and internal iliac arteries are all patent. There is nonobstructive stenosis of the iliac arteries. The left common and deep femoral arteries are patent. There is diffuse plaque in the left SFA with severe eccentric stenosis in the distal left SFA with associated 80-90% luminal narrowing. The vessel is patent throughout. The anterior tibial is patent with diffuse sequential stenoses. The tibioperoneal trunk is patent with mild disease throughout. The peroneal is small. The posterior tibial has excellent runoff to the foot.  Right leg: The common, external, and internal iliac arteries are all patent. There is mild diffuse disease present. The deep femoral artery on the right is patent. The superficial femoral artery is patent with mild nonobstructive plaque. The tibial vessels are all patent with mild diffuse disease and three-vessel runoff to the right foot.  Final Conclusions:   1. Severe left SFA stenosis with successful stenting as described  above  2. Nonobstructive right lower extremity peripheral arterial disease  Recommendations: Continue dual  antiplatelet therapy with aspirin and Effient. Will arrange for followup duplex studies in the office for stent surveillance.   Tonny Bollman 10/30/2011, 11:24 AM

## 2011-11-07 DIAGNOSIS — I10 Essential (primary) hypertension: Secondary | ICD-10-CM | POA: Diagnosis not present

## 2011-11-07 DIAGNOSIS — I209 Angina pectoris, unspecified: Secondary | ICD-10-CM | POA: Diagnosis not present

## 2011-11-07 DIAGNOSIS — I739 Peripheral vascular disease, unspecified: Secondary | ICD-10-CM | POA: Diagnosis not present

## 2011-11-07 DIAGNOSIS — H81399 Other peripheral vertigo, unspecified ear: Secondary | ICD-10-CM | POA: Diagnosis not present

## 2011-11-08 DIAGNOSIS — H35379 Puckering of macula, unspecified eye: Secondary | ICD-10-CM | POA: Diagnosis not present

## 2011-11-08 DIAGNOSIS — H35359 Cystoid macular degeneration, unspecified eye: Secondary | ICD-10-CM | POA: Diagnosis not present

## 2011-11-20 ENCOUNTER — Encounter (INDEPENDENT_AMBULATORY_CARE_PROVIDER_SITE_OTHER): Payer: Medicare Other

## 2011-11-20 DIAGNOSIS — I70219 Atherosclerosis of native arteries of extremities with intermittent claudication, unspecified extremity: Secondary | ICD-10-CM

## 2011-11-20 DIAGNOSIS — I739 Peripheral vascular disease, unspecified: Secondary | ICD-10-CM

## 2011-12-04 DIAGNOSIS — M999 Biomechanical lesion, unspecified: Secondary | ICD-10-CM | POA: Diagnosis not present

## 2011-12-04 DIAGNOSIS — S239XXA Sprain of unspecified parts of thorax, initial encounter: Secondary | ICD-10-CM | POA: Diagnosis not present

## 2011-12-10 DIAGNOSIS — S239XXA Sprain of unspecified parts of thorax, initial encounter: Secondary | ICD-10-CM | POA: Diagnosis not present

## 2011-12-10 DIAGNOSIS — M999 Biomechanical lesion, unspecified: Secondary | ICD-10-CM | POA: Diagnosis not present

## 2011-12-17 ENCOUNTER — Ambulatory Visit (INDEPENDENT_AMBULATORY_CARE_PROVIDER_SITE_OTHER): Payer: Medicare Other | Admitting: Cardiology

## 2011-12-17 ENCOUNTER — Encounter: Payer: Self-pay | Admitting: Cardiology

## 2011-12-17 VITALS — BP 144/78 | HR 66 | Ht 65.0 in | Wt 173.4 lb

## 2011-12-17 DIAGNOSIS — I2510 Atherosclerotic heart disease of native coronary artery without angina pectoris: Secondary | ICD-10-CM

## 2011-12-17 DIAGNOSIS — E782 Mixed hyperlipidemia: Secondary | ICD-10-CM | POA: Diagnosis not present

## 2011-12-17 DIAGNOSIS — I10 Essential (primary) hypertension: Secondary | ICD-10-CM

## 2011-12-17 DIAGNOSIS — I739 Peripheral vascular disease, unspecified: Secondary | ICD-10-CM

## 2011-12-17 DIAGNOSIS — I2589 Other forms of chronic ischemic heart disease: Secondary | ICD-10-CM

## 2011-12-17 DIAGNOSIS — I255 Ischemic cardiomyopathy: Secondary | ICD-10-CM

## 2011-12-17 NOTE — Patient Instructions (Addendum)
Your physician recommends that you schedule a follow-up appointment in: 6 months with Dr. McDowell. You will receive a reminder letter in the mail in about 4 months reminding you to call and schedule your appointment. If you don't receive this letter, please contact our office.  Your physician recommends that you continue on your current medications as directed. Please refer to the Current Medication list given to you today.  

## 2011-12-17 NOTE — Assessment & Plan Note (Signed)
Blood pressure is mildly elevated today. He states that home checks are typically in better range. Will follow for now.

## 2011-12-17 NOTE — Progress Notes (Signed)
Clinical Summary Hunter Townsend is a 71 y.o.male presenting for followup. I saw him back in April and he had subsequent evaluation by Dr. Excell Townsend for PAD and claudication. He underwent angiography in June with findings of severe left SFA stenosis and was treated with stent placement, otherwise nonobstructive right lower extremity PAD. He was continued on DAPT.  He also had a followup echocardiogram in May that showed improvement in LVEF to the range of 50-55% with grade 2 diastolic dysfunction.  He states he is doing very well, significant improvement in his claudication, back to walking. Also no angina or breathlessness beyond NYHA class II. He reports no difficulties with his medications.  No Known Allergies  Current Outpatient Prescriptions  Medication Sig Dispense Refill  . aspirin EC 81 MG tablet Take 81 mg by mouth daily.      . carvedilol (COREG) 6.25 MG tablet Take 6.25 mg by mouth 2 (two) times daily with a meal.      . isosorbide mononitrate (IMDUR) 30 MG 24 hr tablet Take 30 mg by mouth daily.       Marland Kitchen LORazepam (ATIVAN) 1 MG tablet Take 1 mg by mouth at bedtime as needed. for sleep      . losartan (COZAAR) 50 MG tablet Take 50 mg by mouth daily.      . Multiple Vitamins-Minerals (CENTRUM SILVER PO) Take 1 tablet by mouth daily.       . pantoprazole (PROTONIX) 40 MG tablet Take 1 tablet by mouth daily.       . prasugrel (EFFIENT) 10 MG TABS Take 10 mg by mouth daily.      . simvastatin (ZOCOR) 20 MG tablet Take 20 mg by mouth at bedtime.      . nitroGLYCERIN (NITROSTAT) 0.4 MG SL tablet Place 0.4 mg under the tongue every 5 (five) minutes as needed. For chest pain        Past Medical History  Diagnosis Date  . BPH (benign prostatic hyperplasia)   . Coronary atherosclerosis of native coronary artery     Multivessel, NSTEMI  s/p PTCA/DES to mid LAD 06/24/11,, known patent LIMA-LAD and occlusion of all vein grafts  . Hypertension   . Arthritis   . Ischemic cardiomyopathy     EF  35-40% by cath 06/24/11 (EF 37% by nuclear stress test 06/2010).  Marland Kitchen PAD (peripheral artery disease)     Decreased ABIs  05/2011 - 1.1 on R, 0.62 on L, for med rx for now. F/u duplex planned for 04/2012 with Dr. Excell Townsend appointment 05/2012.    Past Surgical History  Procedure Date  . Coronary artery bypass graft 1987    [place], [place] - LIMA to LAD, SVG to RCA presumably  . Back surgery     Social History Hunter Townsend reports that he quit smoking about 18 years ago. His smoking use included Cigarettes. He has a 80 pack-year smoking history. He has never used smokeless tobacco. Hunter Townsend reports that he does not drink alcohol.  Review of Systems No palpitations, bleeding problems. Stable appetite. Some arthritic hip pain. Otherwise negative except as outlined.  Physical Examination Filed Vitals:   12/17/11 1324  BP: 144/78  Pulse: 66    Normally nourished appearing male, no acute distress.  HEENT: Conjunctiva and lids normal, oropharynx with moist mucosa.  Neck: Supple, no elevated JVP or bruits. No thyromegaly.  Lungs: Clear auscultation, nonlabored.  Cardiac: Regular rate and rhythm, soft systolic murmur, split S2, no gallop.  Abdomen: Soft, nontender,  bowel sounds present.  Skin: Warm and dry.  Extremities: No pitting edema. Distal pulses 1+.  Musculoskeletal: No kyphosis.  Neuropsychiatric: Alert and oriented x3, affect appropriate.    Problem List and Plan   Coronary atherosclerosis of native coronary artery Doing well, symptomatically stable. Continue observation on medical therapy. Encouraged regular walking regimen. Followup arranged in 6 months.  Peripheral arterial disease Now status post stent placement due to symptomatic left SFA stenosis, doing well with significant improvement.  Ischemic cardiomyopathy Fortunately has had significant improvement in LVEF to the range of 50-55%. No indication for ICD at this point.  Mixed hyperlipidemia Continue on statin  therapy.  Essential hypertension, benign Blood pressure is mildly elevated today. He states that home checks are typically in better range. Will follow for now.    Jonelle Sidle, M.D., F.A.C.C.

## 2011-12-17 NOTE — Assessment & Plan Note (Signed)
Continue on statin therapy. 

## 2011-12-17 NOTE — Assessment & Plan Note (Signed)
Now status post stent placement due to symptomatic left SFA stenosis, doing well with significant improvement.

## 2011-12-17 NOTE — Assessment & Plan Note (Signed)
Doing well, symptomatically stable. Continue observation on medical therapy. Encouraged regular walking regimen. Followup arranged in 6 months.

## 2011-12-17 NOTE — Assessment & Plan Note (Signed)
Fortunately has had significant improvement in LVEF to the range of 50-55%. No indication for ICD at this point.

## 2011-12-18 DIAGNOSIS — S239XXA Sprain of unspecified parts of thorax, initial encounter: Secondary | ICD-10-CM | POA: Diagnosis not present

## 2011-12-18 DIAGNOSIS — M999 Biomechanical lesion, unspecified: Secondary | ICD-10-CM | POA: Diagnosis not present

## 2011-12-23 ENCOUNTER — Other Ambulatory Visit: Payer: Self-pay | Admitting: Cardiology

## 2011-12-30 DIAGNOSIS — S239XXA Sprain of unspecified parts of thorax, initial encounter: Secondary | ICD-10-CM | POA: Diagnosis not present

## 2011-12-30 DIAGNOSIS — M999 Biomechanical lesion, unspecified: Secondary | ICD-10-CM | POA: Diagnosis not present

## 2012-01-13 DIAGNOSIS — S239XXA Sprain of unspecified parts of thorax, initial encounter: Secondary | ICD-10-CM | POA: Diagnosis not present

## 2012-01-13 DIAGNOSIS — M999 Biomechanical lesion, unspecified: Secondary | ICD-10-CM | POA: Diagnosis not present

## 2012-01-25 ENCOUNTER — Other Ambulatory Visit: Payer: Self-pay | Admitting: Physician Assistant

## 2012-01-28 DIAGNOSIS — M47817 Spondylosis without myelopathy or radiculopathy, lumbosacral region: Secondary | ICD-10-CM | POA: Diagnosis not present

## 2012-01-28 DIAGNOSIS — M999 Biomechanical lesion, unspecified: Secondary | ICD-10-CM | POA: Diagnosis not present

## 2012-01-28 DIAGNOSIS — M5430 Sciatica, unspecified side: Secondary | ICD-10-CM | POA: Diagnosis not present

## 2012-02-11 DIAGNOSIS — M999 Biomechanical lesion, unspecified: Secondary | ICD-10-CM | POA: Diagnosis not present

## 2012-02-11 DIAGNOSIS — M47817 Spondylosis without myelopathy or radiculopathy, lumbosacral region: Secondary | ICD-10-CM | POA: Diagnosis not present

## 2012-02-11 DIAGNOSIS — M5430 Sciatica, unspecified side: Secondary | ICD-10-CM | POA: Diagnosis not present

## 2012-02-24 ENCOUNTER — Other Ambulatory Visit: Payer: Self-pay | Admitting: Cardiology

## 2012-03-29 DIAGNOSIS — Z23 Encounter for immunization: Secondary | ICD-10-CM | POA: Diagnosis not present

## 2012-04-25 DIAGNOSIS — G622 Polyneuropathy due to other toxic agents: Secondary | ICD-10-CM | POA: Diagnosis not present

## 2012-04-25 DIAGNOSIS — I10 Essential (primary) hypertension: Secondary | ICD-10-CM | POA: Diagnosis not present

## 2012-04-25 DIAGNOSIS — G619 Inflammatory polyneuropathy, unspecified: Secondary | ICD-10-CM | POA: Diagnosis not present

## 2012-05-04 DIAGNOSIS — G622 Polyneuropathy due to other toxic agents: Secondary | ICD-10-CM | POA: Diagnosis not present

## 2012-05-04 DIAGNOSIS — Z79899 Other long term (current) drug therapy: Secondary | ICD-10-CM | POA: Diagnosis not present

## 2012-05-04 DIAGNOSIS — I10 Essential (primary) hypertension: Secondary | ICD-10-CM | POA: Diagnosis not present

## 2012-05-04 DIAGNOSIS — Z125 Encounter for screening for malignant neoplasm of prostate: Secondary | ICD-10-CM | POA: Diagnosis not present

## 2012-05-04 DIAGNOSIS — E039 Hypothyroidism, unspecified: Secondary | ICD-10-CM | POA: Diagnosis not present

## 2012-05-04 DIAGNOSIS — G619 Inflammatory polyneuropathy, unspecified: Secondary | ICD-10-CM | POA: Diagnosis not present

## 2012-05-04 DIAGNOSIS — E785 Hyperlipidemia, unspecified: Secondary | ICD-10-CM | POA: Diagnosis not present

## 2012-05-08 DIAGNOSIS — H02409 Unspecified ptosis of unspecified eyelid: Secondary | ICD-10-CM | POA: Diagnosis not present

## 2012-05-08 DIAGNOSIS — G245 Blepharospasm: Secondary | ICD-10-CM | POA: Diagnosis not present

## 2012-05-08 DIAGNOSIS — H35379 Puckering of macula, unspecified eye: Secondary | ICD-10-CM | POA: Diagnosis not present

## 2012-05-08 DIAGNOSIS — H35349 Macular cyst, hole, or pseudohole, unspecified eye: Secondary | ICD-10-CM | POA: Diagnosis not present

## 2012-05-13 ENCOUNTER — Encounter (INDEPENDENT_AMBULATORY_CARE_PROVIDER_SITE_OTHER): Payer: Medicare Other

## 2012-05-13 DIAGNOSIS — I70219 Atherosclerosis of native arteries of extremities with intermittent claudication, unspecified extremity: Secondary | ICD-10-CM | POA: Diagnosis not present

## 2012-05-13 DIAGNOSIS — I739 Peripheral vascular disease, unspecified: Secondary | ICD-10-CM

## 2012-05-25 DIAGNOSIS — H353190 Nonexudative age-related macular degeneration, unspecified eye, stage unspecified: Secondary | ICD-10-CM | POA: Diagnosis not present

## 2012-05-25 DIAGNOSIS — H35349 Macular cyst, hole, or pseudohole, unspecified eye: Secondary | ICD-10-CM | POA: Diagnosis not present

## 2012-05-25 DIAGNOSIS — H35379 Puckering of macula, unspecified eye: Secondary | ICD-10-CM | POA: Diagnosis not present

## 2012-05-31 DIAGNOSIS — Z951 Presence of aortocoronary bypass graft: Secondary | ICD-10-CM | POA: Diagnosis not present

## 2012-05-31 DIAGNOSIS — Z79899 Other long term (current) drug therapy: Secondary | ICD-10-CM | POA: Diagnosis not present

## 2012-05-31 DIAGNOSIS — Z7982 Long term (current) use of aspirin: Secondary | ICD-10-CM | POA: Diagnosis not present

## 2012-05-31 DIAGNOSIS — R0789 Other chest pain: Secondary | ICD-10-CM | POA: Diagnosis not present

## 2012-05-31 DIAGNOSIS — Z9861 Coronary angioplasty status: Secondary | ICD-10-CM | POA: Diagnosis not present

## 2012-05-31 DIAGNOSIS — R040 Epistaxis: Secondary | ICD-10-CM | POA: Diagnosis not present

## 2012-05-31 DIAGNOSIS — I10 Essential (primary) hypertension: Secondary | ICD-10-CM | POA: Diagnosis not present

## 2012-06-01 DIAGNOSIS — Z7982 Long term (current) use of aspirin: Secondary | ICD-10-CM | POA: Diagnosis not present

## 2012-06-01 DIAGNOSIS — Z951 Presence of aortocoronary bypass graft: Secondary | ICD-10-CM | POA: Diagnosis not present

## 2012-06-01 DIAGNOSIS — R040 Epistaxis: Secondary | ICD-10-CM | POA: Diagnosis not present

## 2012-06-01 DIAGNOSIS — I10 Essential (primary) hypertension: Secondary | ICD-10-CM | POA: Diagnosis not present

## 2012-06-01 DIAGNOSIS — Z79899 Other long term (current) drug therapy: Secondary | ICD-10-CM | POA: Diagnosis not present

## 2012-06-03 ENCOUNTER — Telehealth: Payer: Self-pay | Admitting: Cardiology

## 2012-06-03 ENCOUNTER — Ambulatory Visit: Payer: Medicare Other | Admitting: Cardiovascular Disease

## 2012-06-03 ENCOUNTER — Encounter: Payer: Self-pay | Admitting: Cardiology

## 2012-06-03 DIAGNOSIS — I10 Essential (primary) hypertension: Secondary | ICD-10-CM | POA: Diagnosis not present

## 2012-06-03 DIAGNOSIS — R040 Epistaxis: Secondary | ICD-10-CM | POA: Diagnosis not present

## 2012-06-03 NOTE — Telephone Encounter (Signed)
Noted  

## 2012-06-03 NOTE — Telephone Encounter (Signed)
Dr. Myrtis Ser spoke with Dr. Dimas Aguas today in regards to [facility]. Patient has been in the ER [facility] on two different occasions In the past few days with Elevated BP and Nosebleeds. Dr.Katz requested that we cancel appointment today with Dr. Excell Seltzer. This appointment has been cancelled. We will call and re-schedule this appointment at later date per Dr. Myrtis Ser. Dr.Katz Has stopped Prasugrel and Dr. Dimas Aguas will notify patient to stop this medication. Dr.Katz has dictated a note also in Epic.

## 2012-06-03 NOTE — Progress Notes (Signed)
   I spoke by telephone with Dr. Dimas Aguas today about problems the patient is having. He's been having significant nosebleeds. The record shows that his drug-eluting stent was placed January, 2013.  Dr. Dimas Aguas will inform the patient to stop his Prasugrel and remain on aspirin. The nosebleeds have been quite significant including emergency room visits with packing. Hopefully this issue will improve with the patient off dual antiplatelet therapy.  In addition, the patient is scheduled to see Dr. Excell Seltzer today for peripheral vascular followup. The weather is a problem. I will have our office call and reschedule the patient to see Dr. Excell Seltzer at a later date.  Dr. Dimas Aguas will be in touch with the patient today to be sure that the patient knows about the plans for today.  Jerral Bonito, MD

## 2012-06-04 DIAGNOSIS — R040 Epistaxis: Secondary | ICD-10-CM | POA: Diagnosis not present

## 2012-06-04 DIAGNOSIS — J342 Deviated nasal septum: Secondary | ICD-10-CM | POA: Diagnosis not present

## 2012-06-23 DIAGNOSIS — H02539 Eyelid retraction unspecified eye, unspecified lid: Secondary | ICD-10-CM | POA: Diagnosis not present

## 2012-06-23 DIAGNOSIS — H02409 Unspecified ptosis of unspecified eyelid: Secondary | ICD-10-CM | POA: Diagnosis not present

## 2012-06-23 DIAGNOSIS — H02839 Dermatochalasis of unspecified eye, unspecified eyelid: Secondary | ICD-10-CM | POA: Diagnosis not present

## 2012-06-23 DIAGNOSIS — L918 Other hypertrophic disorders of the skin: Secondary | ICD-10-CM | POA: Diagnosis not present

## 2012-06-23 DIAGNOSIS — L908 Other atrophic disorders of skin: Secondary | ICD-10-CM | POA: Diagnosis not present

## 2012-08-06 DIAGNOSIS — H35379 Puckering of macula, unspecified eye: Secondary | ICD-10-CM | POA: Diagnosis not present

## 2012-08-07 ENCOUNTER — Encounter: Payer: Self-pay | Admitting: Cardiovascular Disease

## 2012-08-07 ENCOUNTER — Ambulatory Visit (INDEPENDENT_AMBULATORY_CARE_PROVIDER_SITE_OTHER): Payer: Medicare Other | Admitting: Cardiovascular Disease

## 2012-08-07 VITALS — BP 124/80 | HR 81 | Ht 65.0 in | Wt 179.0 lb

## 2012-08-07 DIAGNOSIS — I2510 Atherosclerotic heart disease of native coronary artery without angina pectoris: Secondary | ICD-10-CM | POA: Diagnosis not present

## 2012-08-07 DIAGNOSIS — I739 Peripheral vascular disease, unspecified: Secondary | ICD-10-CM

## 2012-08-07 NOTE — Progress Notes (Signed)
HPI:  72 year old gentleman presenting for followup evaluation. He is followed for peripheral arterial disease with intermittent claudication. Dr. Diona Browner is his cardiologist. The patient had severe intermittent claudication of the left leg. He was managed medically but failed medical therapy and continued to have severe lifestyle limitation. He underwent left SFA stenting last year he has done well since that time. A followup lower extremity duplex scan demonstrated continued patency of his stent site with normal ABIs. He presents today for followup.  The patient is not limited by leg pain. He has no calf pain with ambulation. He complains of some joint stiffness in the mornings. He feels better since he has stopped his cholesterol medication. He was previously experiencing a lot of back problems but this has resolved since discontinuation of his statin. He denies chest pain or dyspnea.  Outpatient Encounter Prescriptions as of 08/07/2012  Medication Sig Dispense Refill  . aspirin EC 81 MG tablet Take 81 mg by mouth daily.      Marland Kitchen COREG 6.25 MG tablet TAKE 1 TABLET BY MOUTH TWICE DAILY WITH A MEAL.  60 each  6  . COZAAR 50 MG tablet TAKE 1 TABLET ONCE DAILY.  30 tablet  6  . isosorbide mononitrate (IMDUR) 30 MG 24 hr tablet Take 30 mg by mouth 2 (two) times daily.       Marland Kitchen LORazepam (ATIVAN) 1 MG tablet Take 1 mg by mouth 2 (two) times daily as needed. for sleep      . nitroGLYCERIN (NITROSTAT) 0.4 MG SL tablet Place 0.4 mg under the tongue every 5 (five) minutes as needed. For chest pain      . pantoprazole (PROTONIX) 40 MG tablet Take 1 tablet by mouth daily.       . [DISCONTINUED] losartan (COZAAR) 50 MG tablet Take 50 mg by mouth daily.      . [DISCONTINUED] Multiple Vitamins-Minerals (CENTRUM SILVER PO) Take 1 tablet by mouth daily.       . [DISCONTINUED] ZOCOR 20 MG tablet TAKE ONE TABLET BY MOUTH AT BEDTIME.  30 each  6   No facility-administered encounter medications on file as of  08/07/2012.    No Known Allergies  Past Medical History  Diagnosis Date  . BPH (benign prostatic hyperplasia)   . Coronary atherosclerosis of native coronary artery     Multivessel, NSTEMI  s/p PTCA/DES to mid LAD 06/24/11,, known patent LIMA-LAD and occlusion of all vein grafts  . Hypertension   . Arthritis   . Ischemic cardiomyopathy     EF 35-40% by cath 06/24/11 (EF 37% by nuclear stress test 06/2010).  Marland Kitchen PAD (peripheral artery disease)     Decreased ABIs  05/2011 - 1.1 on R, 0.62 on L, for med rx for now. F/u duplex planned for 04/2012 with Dr. Excell Seltzer appointment 05/2012.    ROS: Negative except as per HPI  BP 124/80  Pulse 81  Ht 5\' 5"  (1.651 m)  Wt 81.194 kg (179 lb)  BMI 29.79 kg/m2  SpO2 97%  PHYSICAL EXAM: Pt is alert and oriented, NAD HEENT: normal Neck: JVP - normal, carotids 2+= without bruits Lungs: CTA bilaterally CV: RRR without murmur or gallop Abd: soft, NT, Positive BS, no hepatomegaly Ext: no C/C/E, distal pulses intact and equal Skin: warm/dry no rash  EKG:  Normal sinus rhythm 80 beats per minute, rightward axis, cannot rule out age-indeterminate septal infarction, nonspecific ST abnormality.  ASSESSMENT AND PLAN: Lower extremity peripheral arterial disease. The patient is stable without  claudication symptoms following left SFA stenting. He should have repeat Doppler studies with ABIs this summer which will be a six-month interval from his previous study. If that study is stable we will follow him yearly thereafter. I would like to see him back in clinical followup in 1 year. His medications were reviewed and he is on appropriate medical therapy. The patient is statin intolerant as outlined in the HPI.  Tonny Bollman 08/07/2012 2:40 PM

## 2012-08-07 NOTE — Patient Instructions (Addendum)
Your physician wants you to follow-up in: 1 YEAR with Dr Excell Seltzer.  You will receive a reminder letter in the mail two months in advance. If you don't receive a letter, please call our office to schedule the follow-up appointment.  Your physician has requested that you have a lower extremity arterial duplex with ABI in [place]. This test is an ultrasound of the arteries in the legs. It looks at arterial blood flow in the legs. Allow one hour for Lower Arterial scans. There are no restrictions or special instructions

## 2012-09-21 ENCOUNTER — Other Ambulatory Visit: Payer: Self-pay | Admitting: Cardiology

## 2012-09-22 DIAGNOSIS — H02409 Unspecified ptosis of unspecified eyelid: Secondary | ICD-10-CM | POA: Diagnosis not present

## 2012-10-29 ENCOUNTER — Encounter (INDEPENDENT_AMBULATORY_CARE_PROVIDER_SITE_OTHER): Payer: Medicare Other

## 2012-10-29 DIAGNOSIS — I70219 Atherosclerosis of native arteries of extremities with intermittent claudication, unspecified extremity: Secondary | ICD-10-CM | POA: Diagnosis not present

## 2012-10-29 DIAGNOSIS — I739 Peripheral vascular disease, unspecified: Secondary | ICD-10-CM | POA: Diagnosis not present

## 2012-10-29 DIAGNOSIS — I2510 Atherosclerotic heart disease of native coronary artery without angina pectoris: Secondary | ICD-10-CM

## 2012-11-03 ENCOUNTER — Encounter: Payer: Self-pay | Admitting: Cardiovascular Disease

## 2012-11-04 ENCOUNTER — Ambulatory Visit (INDEPENDENT_AMBULATORY_CARE_PROVIDER_SITE_OTHER): Payer: Medicare Other | Admitting: Cardiology

## 2012-11-04 ENCOUNTER — Encounter: Payer: Self-pay | Admitting: Cardiology

## 2012-11-04 VITALS — BP 125/64 | HR 66 | Ht 65.0 in | Wt 170.0 lb

## 2012-11-04 DIAGNOSIS — I10 Essential (primary) hypertension: Secondary | ICD-10-CM | POA: Diagnosis not present

## 2012-11-04 DIAGNOSIS — I739 Peripheral vascular disease, unspecified: Secondary | ICD-10-CM | POA: Diagnosis not present

## 2012-11-04 DIAGNOSIS — I255 Ischemic cardiomyopathy: Secondary | ICD-10-CM

## 2012-11-04 DIAGNOSIS — I2589 Other forms of chronic ischemic heart disease: Secondary | ICD-10-CM

## 2012-11-04 DIAGNOSIS — I2510 Atherosclerotic heart disease of native coronary artery without angina pectoris: Secondary | ICD-10-CM | POA: Diagnosis not present

## 2012-11-04 MED ORDER — NITROGLYCERIN 0.4 MG SL SUBL: 0.4000 mg | SUBLINGUAL_TABLET | SUBLINGUAL | Status: DC | PRN

## 2012-11-04 NOTE — Patient Instructions (Addendum)

## 2012-11-04 NOTE — Assessment & Plan Note (Signed)
Status post left SFA stent by Dr. Excell Seltzer, recently noted to be patent with normal ABIs.

## 2012-11-04 NOTE — Assessment & Plan Note (Signed)
Reviewed home blood pressure checks. No changes made today.

## 2012-11-04 NOTE — Assessment & Plan Note (Signed)
LVEF improved the range of 50-55% on medical therapy.

## 2012-11-04 NOTE — Progress Notes (Signed)
Clinical Summary Hunter Townsend is a 72 y.o.male last seen in July 2013. Interval followup with Dr. Excell Seltzer noted in March of this year. He was stable at that time following left SFA stenting previously. He just had recent lower extremity arterial studies with documentation of widely patent left mid SFA stent, ABI 1.1 bilaterally.  He states that he has been doing very well, no significant claudication, walking on a regular basis for exercise. He has also been trying to lose weight, get somewhere around 165 pounds. He reports no angina symptoms. Does need a refill for nitroglycerin, has an old bottle. He reports compliance with his medications.  Followup echocardiogram in May of last year demonstrated improvement in LVEF the range of 50-55% with grade 2 diastolic dysfunction on medical therapy.  He will seeing Dr. Dimas Aguas soon for a routine visit.   No Known Allergies  Current Outpatient Prescriptions  Medication Sig Dispense Refill  . aspirin EC 81 MG tablet Take 81 mg by mouth daily.      Marland Kitchen COREG 6.25 MG tablet TAKE 1 TABLET BY MOUTH TWICE DAILY WITH A MEAL.  60 each  6  . isosorbide mononitrate (IMDUR) 30 MG 24 hr tablet Take 30 mg by mouth 2 (two) times daily.       Marland Kitchen LORazepam (ATIVAN) 1 MG tablet Take 1 mg by mouth 2 (two) times daily as needed. for sleep      . losartan (COZAAR) 50 MG tablet TAKE (1) TABLET BY MOUTH ONCE DAILY.  30 tablet  1  . nitroGLYCERIN (NITROSTAT) 0.4 MG SL tablet Place 1 tablet (0.4 mg total) under the tongue every 5 (five) minutes as needed. For chest pain  25 tablet  3  . pantoprazole (PROTONIX) 40 MG tablet Take 1 tablet by mouth daily.        No current facility-administered medications for this visit.    Past Medical History  Diagnosis Date  . BPH (benign prostatic hyperplasia)   . Coronary atherosclerosis of native coronary artery     Multivessel, NSTEMI  s/p PTCA/DES to mid LAD 06/24/11,, known patent LIMA-LAD and occlusion of all vein grafts  .  Hypertension   . Arthritis   . Ischemic cardiomyopathy     EF 35-40% by cath 06/24/11 (EF 37% by nuclear stress test 06/2010).  Marland Kitchen PAD (peripheral artery disease)     Decreased ABIs  05/2011 - 1.1 on R, 0.62 on L, for med rx for now. F/u duplex planned for 04/2012 with Dr. Excell Seltzer appointment 05/2012.    Past Surgical History  Procedure Laterality Date  . Coronary artery bypass graft  1987    [place], [place] - LIMA to LAD, SVG to RCA presumably  . Back surgery      Social History Hunter Townsend reports that he quit smoking about 19 years ago. His smoking use included Cigarettes. He has a 80 pack-year smoking history. He has never used smokeless tobacco. Hunter Townsend reports that he does not drink alcohol.  Review of Systems No palpitations, dizziness, syncope. No longer on DAPT, was having nosebleeds previously. He was negative.  Physical Examination Filed Vitals:   11/04/12 1342  BP: 125/64  Pulse: 66   Filed Weights   11/04/12 1342  Weight: 170 lb (77.111 kg)    Normally nourished appearing male, comfortable at rest.  HEENT: Conjunctiva and lids normal, oropharynx with moist mucosa.  Neck: Supple, no elevated JVP or bruits. No thyromegaly.  Lungs: Clear auscultation, nonlabored.  Cardiac: Regular rate  and rhythm, soft systolic murmur, split S2, no gallop.  Abdomen: Soft, nontender, bowel sounds present.  Skin: Warm and dry.  Extremities: No pitting edema. Distal pulses 1+.  Musculoskeletal: No kyphosis.  Neuropsychiatric: Alert and oriented x3, affect appropriate.    Problem List and Plan   Coronary atherosclerosis of native coronary artery Symptomatically stable on medical therapy. Encourage regular exercise and diet. Followup arranged.  Essential hypertension, benign Reviewed home blood pressure checks. No changes made today.  Ischemic cardiomyopathy LVEF improved the range of 50-55% on medical therapy.  Peripheral arterial disease Status post left SFA stent by Dr.  Excell Seltzer, recently noted to be patent with normal ABIs.    Jonelle Sidle, M.D., F.A.C.C.

## 2012-11-04 NOTE — Assessment & Plan Note (Signed)
Symptomatically stable on medical therapy. Encourage regular exercise and diet. Followup arranged.

## 2012-11-05 DIAGNOSIS — G4700 Insomnia, unspecified: Secondary | ICD-10-CM | POA: Diagnosis not present

## 2012-11-05 DIAGNOSIS — K219 Gastro-esophageal reflux disease without esophagitis: Secondary | ICD-10-CM | POA: Diagnosis not present

## 2012-11-05 DIAGNOSIS — G619 Inflammatory polyneuropathy, unspecified: Secondary | ICD-10-CM | POA: Diagnosis not present

## 2012-11-05 DIAGNOSIS — G622 Polyneuropathy due to other toxic agents: Secondary | ICD-10-CM | POA: Diagnosis not present

## 2012-11-05 DIAGNOSIS — I10 Essential (primary) hypertension: Secondary | ICD-10-CM | POA: Diagnosis not present

## 2012-11-05 DIAGNOSIS — M129 Arthropathy, unspecified: Secondary | ICD-10-CM | POA: Diagnosis not present

## 2012-11-05 DIAGNOSIS — I209 Angina pectoris, unspecified: Secondary | ICD-10-CM | POA: Diagnosis not present

## 2012-11-09 DIAGNOSIS — H3582 Retinal ischemia: Secondary | ICD-10-CM | POA: Diagnosis not present

## 2012-12-21 DIAGNOSIS — H3530 Unspecified macular degeneration: Secondary | ICD-10-CM | POA: Diagnosis not present

## 2012-12-21 DIAGNOSIS — H35349 Macular cyst, hole, or pseudohole, unspecified eye: Secondary | ICD-10-CM | POA: Diagnosis not present

## 2012-12-30 ENCOUNTER — Other Ambulatory Visit: Payer: Self-pay

## 2013-01-04 DIAGNOSIS — R079 Chest pain, unspecified: Secondary | ICD-10-CM | POA: Diagnosis not present

## 2013-01-04 DIAGNOSIS — Z7982 Long term (current) use of aspirin: Secondary | ICD-10-CM | POA: Diagnosis not present

## 2013-01-04 DIAGNOSIS — I10 Essential (primary) hypertension: Secondary | ICD-10-CM | POA: Diagnosis not present

## 2013-01-04 DIAGNOSIS — Z79899 Other long term (current) drug therapy: Secondary | ICD-10-CM | POA: Diagnosis not present

## 2013-01-04 DIAGNOSIS — E785 Hyperlipidemia, unspecified: Secondary | ICD-10-CM | POA: Diagnosis not present

## 2013-01-04 DIAGNOSIS — Z87891 Personal history of nicotine dependence: Secondary | ICD-10-CM | POA: Diagnosis not present

## 2013-01-04 DIAGNOSIS — R0602 Shortness of breath: Secondary | ICD-10-CM | POA: Diagnosis not present

## 2013-01-04 DIAGNOSIS — I2510 Atherosclerotic heart disease of native coronary artery without angina pectoris: Secondary | ICD-10-CM | POA: Diagnosis not present

## 2013-01-04 DIAGNOSIS — F329 Major depressive disorder, single episode, unspecified: Secondary | ICD-10-CM | POA: Diagnosis not present

## 2013-01-04 DIAGNOSIS — Z9861 Coronary angioplasty status: Secondary | ICD-10-CM | POA: Diagnosis not present

## 2013-01-04 DIAGNOSIS — F411 Generalized anxiety disorder: Secondary | ICD-10-CM | POA: Diagnosis not present

## 2013-01-04 DIAGNOSIS — R51 Headache: Secondary | ICD-10-CM | POA: Diagnosis not present

## 2013-01-04 DIAGNOSIS — F3289 Other specified depressive episodes: Secondary | ICD-10-CM | POA: Diagnosis not present

## 2013-01-04 DIAGNOSIS — R0789 Other chest pain: Secondary | ICD-10-CM | POA: Diagnosis not present

## 2013-01-05 DIAGNOSIS — R079 Chest pain, unspecified: Secondary | ICD-10-CM | POA: Diagnosis not present

## 2013-01-18 DIAGNOSIS — I209 Angina pectoris, unspecified: Secondary | ICD-10-CM | POA: Diagnosis not present

## 2013-02-08 DIAGNOSIS — I10 Essential (primary) hypertension: Secondary | ICD-10-CM | POA: Diagnosis not present

## 2013-02-08 DIAGNOSIS — K219 Gastro-esophageal reflux disease without esophagitis: Secondary | ICD-10-CM | POA: Diagnosis not present

## 2013-02-10 DIAGNOSIS — M25579 Pain in unspecified ankle and joints of unspecified foot: Secondary | ICD-10-CM | POA: Diagnosis not present

## 2013-02-10 DIAGNOSIS — M79609 Pain in unspecified limb: Secondary | ICD-10-CM | POA: Diagnosis not present

## 2013-02-11 ENCOUNTER — Ambulatory Visit: Payer: Medicare Other | Admitting: Cardiology

## 2013-02-15 DIAGNOSIS — G4700 Insomnia, unspecified: Secondary | ICD-10-CM | POA: Diagnosis not present

## 2013-02-15 DIAGNOSIS — I209 Angina pectoris, unspecified: Secondary | ICD-10-CM | POA: Diagnosis not present

## 2013-02-15 DIAGNOSIS — I739 Peripheral vascular disease, unspecified: Secondary | ICD-10-CM | POA: Diagnosis not present

## 2013-02-15 DIAGNOSIS — I10 Essential (primary) hypertension: Secondary | ICD-10-CM | POA: Diagnosis not present

## 2013-02-23 ENCOUNTER — Other Ambulatory Visit: Payer: Self-pay | Admitting: Cardiology

## 2013-02-23 ENCOUNTER — Ambulatory Visit (INDEPENDENT_AMBULATORY_CARE_PROVIDER_SITE_OTHER): Payer: Medicare Other | Admitting: Cardiology

## 2013-02-23 ENCOUNTER — Encounter: Payer: Self-pay | Admitting: Cardiology

## 2013-02-23 ENCOUNTER — Encounter (HOSPITAL_COMMUNITY): Payer: Self-pay

## 2013-02-23 ENCOUNTER — Encounter: Payer: Self-pay | Admitting: *Deleted

## 2013-02-23 VITALS — BP 133/86 | HR 97 | Ht 65.0 in | Wt 172.0 lb

## 2013-02-23 DIAGNOSIS — I200 Unstable angina: Secondary | ICD-10-CM

## 2013-02-23 DIAGNOSIS — Z01818 Encounter for other preprocedural examination: Secondary | ICD-10-CM

## 2013-02-23 DIAGNOSIS — I2510 Atherosclerotic heart disease of native coronary artery without angina pectoris: Secondary | ICD-10-CM

## 2013-02-23 DIAGNOSIS — I10 Essential (primary) hypertension: Secondary | ICD-10-CM

## 2013-02-23 DIAGNOSIS — I2589 Other forms of chronic ischemic heart disease: Secondary | ICD-10-CM | POA: Diagnosis not present

## 2013-02-23 DIAGNOSIS — I739 Peripheral vascular disease, unspecified: Secondary | ICD-10-CM | POA: Diagnosis not present

## 2013-02-23 DIAGNOSIS — Z7901 Long term (current) use of anticoagulants: Secondary | ICD-10-CM | POA: Diagnosis not present

## 2013-02-23 LAB — PROTIME-INR
INR: 1.19 (ref <1.50)
Prothrombin Time: 14.8 seconds (ref 11.6–15.2)

## 2013-02-23 LAB — APTT: aPTT: 28 seconds (ref 24–37)

## 2013-02-23 LAB — BASIC METABOLIC PANEL
Calcium: 9.7 mg/dL (ref 8.4–10.5)
Glucose, Bld: 107 mg/dL — ABNORMAL HIGH (ref 70–99)
Potassium: 4.6 mEq/L (ref 3.5–5.3)
Sodium: 139 mEq/L (ref 135–145)

## 2013-02-23 LAB — CBC
MCH: 32.8 pg (ref 26.0–34.0)
MCHC: 35 g/dL (ref 30.0–36.0)
Platelets: 273 10*3/uL (ref 150–400)
RDW: 12.4 % (ref 11.5–15.5)

## 2013-02-23 NOTE — Assessment & Plan Note (Signed)
Blood pressure control reasonable today. No changes made. 

## 2013-02-23 NOTE — Progress Notes (Signed)
Clinical Summary Mr. Mendonca is a 72 y.o.male presenting for followup. He was last seen in June. Interval records indicate observation at [facility] in August with  chest pain. He ruled out for myocardial infarction by enzymes. Lab work revealed potassium 3.7, BUN 10, creatinine 0.9, d-dimer less than 0.27, hemoglobin 14.0, platelets 266. Chest x-ray showed no acute process. Faxed copy of ECG showed sinus rhythm with left atrial enlargement and nonspecific ST changes.  We reviewed his symptoms today. He continues to report increasing episodes of fatigue and exertional chest tightness reminiscent of his angina. He reports that nitroglycerin is helpful. He has otherwise been on a stable medical regimen.  Last intervention was in January 2013 as outlined below.  No Known Allergies  Current Outpatient Prescriptions  Medication Sig Dispense Refill  . aspirin EC 81 MG tablet Take 81 mg by mouth daily.      Marland Kitchen COREG 6.25 MG tablet TAKE 1 TABLET BY MOUTH TWICE DAILY WITH A MEAL.  60 each  6  . isosorbide mononitrate (IMDUR) 30 MG 24 hr tablet Take 30 mg by mouth 2 (two) times daily.       Marland Kitchen LORazepam (ATIVAN) 1 MG tablet Take 1 mg by mouth 2 (two) times daily as needed. for sleep      . losartan (COZAAR) 50 MG tablet TAKE (1) TABLET BY MOUTH ONCE DAILY.  30 tablet  1  . nitroGLYCERIN (NITROSTAT) 0.4 MG SL tablet Place 1 tablet (0.4 mg total) under the tongue every 5 (five) minutes as needed. For chest pain  25 tablet  3  . pantoprazole (PROTONIX) 40 MG tablet Take 1 tablet by mouth daily.        No current facility-administered medications for this visit.    Past Medical History  Diagnosis Date  . BPH (benign prostatic hyperplasia)   . Coronary atherosclerosis of native coronary artery     Multivessel, NSTEMI  s/p PTCA/DES to mid LAD 06/24/11,, known patent LIMA-LAD and occlusion of all vein grafts  . Hypertension   . Arthritis   . Ischemic cardiomyopathy     EF 35-40% by cath 06/24/11 (EF 37%  by nuclear stress test 06/2010).  Marland Kitchen PAD (peripheral artery disease)     Status post left SFA stent - Dr. Excell Seltzer    Past Surgical History  Procedure Laterality Date  . Coronary artery bypass graft  1987    [place], [place] - LIMA to LAD, SVG to RCA presumably  . Back surgery      Family History  Problem Relation Age of Onset  . CAD    . Hypertension      Social History Mr. Abdelaziz reports that he quit smoking about 19 years ago. His smoking use included Cigarettes. He has a 80 pack-year smoking history. He has never used smokeless tobacco. Mr. Bekker reports that he does not drink alcohol.  Review of Systems No palpitations or syncope. NYHA class 2-3 dyspnea. No reported bleeding episodes. Improved claudication. Otherwise negative except as outlined.  Physical Examination Filed Vitals:   02/23/13 1103  BP: 133/86  Pulse: 97   Filed Weights   02/23/13 1103  Weight: 172 lb (78.019 kg)    Normally nourished appearing male, comfortable at rest.  HEENT: Conjunctiva and lids normal, oropharynx with moist mucosa.  Neck: Supple, no elevated JVP or bruits. No thyromegaly.  Lungs: Clear auscultation, nonlabored.  Cardiac: Regular rate and rhythm, soft systolic murmur, split S2, no gallop.  Abdomen: Soft, nontender, bowel sounds present.  Skin: Warm and dry.  Extremities: No pitting edema. Distal pulses 1+.  Musculoskeletal: No kyphosis.  Neuropsychiatric: Alert and oriented x3, affect appropriate.    Problem List and Plan   Accelerating angina Increasing occurrence of exertional fatigue and angina on reasonable medical regimen, responsive to nitroglycerin. He has multivessel disease with known graft disease, last intervention being DES to LAD in January 2013. We have discussed options for evaluation, and plan is to proceed with a diagnostic cardiac catheterization to best determine if there are any revascularization options to consider. This is being scheduled Dr. Excell Seltzer on  Friday.  Coronary atherosclerosis of native coronary artery Multivessel status post prior CABG with more recent PTCA/DES to mid LAD in January 2013. LVEF 50-55% as of May 2013.  Essential hypertension, benign Blood pressure control reasonable today. No changes made.  Peripheral arterial disease Improved claudication status post left SFA stent by Dr. Excell Seltzer.    Jonelle Sidle, M.D., F.A.C.C.

## 2013-02-23 NOTE — Assessment & Plan Note (Signed)
Multivessel status post prior CABG with more recent PTCA/DES to mid LAD in January 2013. LVEF 50-55% as of May 2013.

## 2013-02-23 NOTE — Assessment & Plan Note (Signed)
Increasing occurrence of exertional fatigue and angina on reasonable medical regimen, responsive to nitroglycerin. He has multivessel disease with known graft disease, last intervention being DES to LAD in January 2013. We have discussed options for evaluation, and plan is to proceed with a diagnostic cardiac catheterization to best determine if there are any revascularization options to consider. This is being scheduled Dr. Excell Seltzer on Friday.

## 2013-02-23 NOTE — Assessment & Plan Note (Addendum)
Improved claudication status post left SFA stent by Dr. Excell Seltzer.

## 2013-02-23 NOTE — Patient Instructions (Addendum)
Your physician recommends that you schedule a follow-up appointment in: 3 weeks  Your physician has requested that you have a cardiac catheterization. Cardiac catheterization is used to diagnose and/or treat various heart conditions. Doctors may recommend this procedure for a number of different reasons. The most common reason is to evaluate chest pain. Chest pain can be a symptom of coronary artery disease (CAD), and cardiac catheterization can show whether plaque is narrowing or blocking your heart's arteries. This procedure is also used to evaluate the valves, as well as measure the blood flow and oxygen levels in different parts of your heart. For further information please visit [email or web address] Please follow instruction sheet, as given.  Your physician recommends that you return for lab work in: TODAY (SLIPS GIVEN FOR CBC,BMET,PT/INR/PTT)

## 2013-02-24 ENCOUNTER — Encounter: Payer: Medicare Other | Admitting: Cardiovascular Disease

## 2013-02-26 ENCOUNTER — Encounter (HOSPITAL_COMMUNITY): Payer: Self-pay | Admitting: *Deleted

## 2013-02-26 ENCOUNTER — Ambulatory Visit (HOSPITAL_COMMUNITY)
Admission: RE | Admit: 2013-02-26 | Discharge: 2013-02-27 | Disposition: A | Discharge status: Home / Self Care | Payer: Medicare Other | Source: Ambulatory Visit | Attending: Cardiovascular Disease | Admitting: Cardiovascular Disease

## 2013-02-26 ENCOUNTER — Encounter (HOSPITAL_COMMUNITY)
Admission: RE | Disposition: A | Discharge status: Home / Self Care | Payer: Self-pay | Source: Ambulatory Visit | Attending: Cardiovascular Disease

## 2013-02-26 DIAGNOSIS — I255 Ischemic cardiomyopathy: Secondary | ICD-10-CM

## 2013-02-26 DIAGNOSIS — I739 Peripheral vascular disease, unspecified: Secondary | ICD-10-CM | POA: Insufficient documentation

## 2013-02-26 DIAGNOSIS — I2510 Atherosclerotic heart disease of native coronary artery without angina pectoris: Secondary | ICD-10-CM | POA: Insufficient documentation

## 2013-02-26 DIAGNOSIS — I25810 Atherosclerosis of coronary artery bypass graft(s) without angina pectoris: Secondary | ICD-10-CM | POA: Insufficient documentation

## 2013-02-26 DIAGNOSIS — I200 Unstable angina: Secondary | ICD-10-CM | POA: Diagnosis not present

## 2013-02-26 DIAGNOSIS — Z79899 Other long term (current) drug therapy: Secondary | ICD-10-CM | POA: Diagnosis not present

## 2013-02-26 DIAGNOSIS — I10 Essential (primary) hypertension: Secondary | ICD-10-CM | POA: Diagnosis not present

## 2013-02-26 DIAGNOSIS — I708 Atherosclerosis of other arteries: Secondary | ICD-10-CM | POA: Insufficient documentation

## 2013-02-26 DIAGNOSIS — Z7902 Long term (current) use of antithrombotics/antiplatelets: Secondary | ICD-10-CM | POA: Diagnosis not present

## 2013-02-26 DIAGNOSIS — I519 Heart disease, unspecified: Secondary | ICD-10-CM | POA: Insufficient documentation

## 2013-02-26 DIAGNOSIS — Z9861 Coronary angioplasty status: Secondary | ICD-10-CM | POA: Diagnosis not present

## 2013-02-26 DIAGNOSIS — I70219 Atherosclerosis of native arteries of extremities with intermittent claudication, unspecified extremity: Secondary | ICD-10-CM

## 2013-02-26 HISTORY — PX: PERCUTANEOUS CORONARY STENT INTERVENTION (PCI-S): SHX5485

## 2013-02-26 HISTORY — PX: LEFT HEART CATHETERIZATION WITH CORONARY/GRAFT ANGIOGRAM: SHX5450

## 2013-02-26 SURGERY — LEFT HEART CATHETERIZATION WITH CORONARY/GRAFT ANGIOGRAM
Anesthesia: LOCAL

## 2013-02-26 MED ORDER — MIDAZOLAM HCL 2 MG/2ML IJ SOLN
INTRAMUSCULAR | Status: AC
  Filled 2013-02-26: qty 2

## 2013-02-26 MED ORDER — GABAPENTIN 100 MG PO CAPS
100.0000 mg | ORAL_CAPSULE | Freq: Three times a day (TID) | ORAL | Status: DC
  Administered 2013-02-26 – 2013-02-27 (×3): 100 mg via ORAL
  Filled 2013-02-26 (×5): qty 1

## 2013-02-26 MED ORDER — CARVEDILOL 6.25 MG PO TABS
6.2500 mg | ORAL_TABLET | Freq: Two times a day (BID) | ORAL | Status: DC
  Administered 2013-02-26 – 2013-02-27 (×2): 6.25 mg via ORAL
  Filled 2013-02-26 (×4): qty 1
  Filled 2013-02-26 (×2): qty 2

## 2013-02-26 MED ORDER — ONDANSETRON HCL 4 MG/2ML IJ SOLN: 4.0000 mg | Freq: Four times a day (QID) | INTRAMUSCULAR | Status: DC | PRN

## 2013-02-26 MED ORDER — ISOSORBIDE MONONITRATE ER 30 MG PO TB24
30.0000 mg | ORAL_TABLET | Freq: Two times a day (BID) | ORAL | Status: DC
  Administered 2013-02-26 – 2013-02-27 (×2): 30 mg via ORAL
  Filled 2013-02-26 (×3): qty 1

## 2013-02-26 MED ORDER — SODIUM CHLORIDE 0.9 % IV SOLN: 250.0000 mL | INTRAVENOUS | Status: DC | PRN

## 2013-02-26 MED ORDER — SODIUM CHLORIDE 0.9 % IJ SOLN: 3.0000 mL | Freq: Two times a day (BID) | INTRAMUSCULAR | Status: DC

## 2013-02-26 MED ORDER — SODIUM CHLORIDE 0.9 % IV SOLN
1.0000 mL/kg/h | INTRAVENOUS | Status: AC
  Administered 2013-02-26: 1 mL/kg/h via INTRAVENOUS

## 2013-02-26 MED ORDER — CARVEDILOL 3.125 MG PO TABS: 3.1250 mg | ORAL_TABLET | Freq: Two times a day (BID) | ORAL | Status: DC

## 2013-02-26 MED ORDER — NITROGLYCERIN 0.4 MG SL SUBL: 0.4000 mg | SUBLINGUAL_TABLET | SUBLINGUAL | Status: DC | PRN

## 2013-02-26 MED ORDER — HYDRALAZINE HCL 20 MG/ML IJ SOLN
10.0000 mg | INTRAMUSCULAR | Status: DC | PRN
  Administered 2013-02-27: 08:00:00 10 mg via INTRAVENOUS
  Filled 2013-02-26: qty 1

## 2013-02-26 MED ORDER — SODIUM CHLORIDE 0.9 % IJ SOLN: 3.0000 mL | INTRAMUSCULAR | Status: DC | PRN

## 2013-02-26 MED ORDER — FAMOTIDINE IN NACL 20-0.9 MG/50ML-% IV SOLN
INTRAVENOUS | Status: AC
  Filled 2013-02-26: qty 50

## 2013-02-26 MED ORDER — LIDOCAINE HCL (PF) 1 % IJ SOLN
INTRAMUSCULAR | Status: AC
  Filled 2013-02-26: qty 30

## 2013-02-26 MED ORDER — PANTOPRAZOLE SODIUM 40 MG PO TBEC
40.0000 mg | DELAYED_RELEASE_TABLET | Freq: Every day | ORAL | Status: DC
  Administered 2013-02-26 – 2013-02-27 (×2): 40 mg via ORAL
  Filled 2013-02-26 (×2): qty 1

## 2013-02-26 MED ORDER — HEPARIN (PORCINE) IN NACL 2-0.9 UNIT/ML-% IJ SOLN
INTRAMUSCULAR | Status: AC
  Filled 2013-02-26: qty 1000

## 2013-02-26 MED ORDER — CLOPIDOGREL BISULFATE 300 MG PO TABS
ORAL_TABLET | ORAL | Status: AC
  Filled 2013-02-26: qty 2

## 2013-02-26 MED ORDER — SODIUM CHLORIDE 0.9 % IV SOLN
INTRAVENOUS | Status: DC
  Administered 2013-02-26: 09:00:00 via INTRAVENOUS

## 2013-02-26 MED ORDER — ASPIRIN EC 81 MG PO TBEC
81.0000 mg | DELAYED_RELEASE_TABLET | Freq: Every day | ORAL | Status: DC
  Administered 2013-02-27: 10:00:00 81 mg via ORAL
  Filled 2013-02-26: qty 1

## 2013-02-26 MED ORDER — CARVEDILOL 3.125 MG PO TABS: 3.1250 mg | ORAL_TABLET | Freq: Once | ORAL | Status: DC

## 2013-02-26 MED ORDER — NITROGLYCERIN 0.2 MG/ML ON CALL CATH LAB
INTRAVENOUS | Status: AC
  Filled 2013-02-26: qty 1

## 2013-02-26 MED ORDER — LORAZEPAM 0.5 MG PO TABS: 1.0000 mg | ORAL_TABLET | Freq: Two times a day (BID) | ORAL | Status: DC | PRN

## 2013-02-26 MED ORDER — ASPIRIN 81 MG PO CHEW
81.0000 mg | CHEWABLE_TABLET | ORAL | Status: AC
  Administered 2013-02-26: 324 mg via ORAL
  Filled 2013-02-26: qty 4

## 2013-02-26 MED ORDER — FENTANYL CITRATE 0.05 MG/ML IJ SOLN
INTRAMUSCULAR | Status: AC
  Filled 2013-02-26: qty 2

## 2013-02-26 MED ORDER — VERAPAMIL HCL 2.5 MG/ML IV SOLN
INTRAVENOUS | Status: AC
  Filled 2013-02-26: qty 2

## 2013-02-26 MED ORDER — LOSARTAN POTASSIUM 50 MG PO TABS
50.0000 mg | ORAL_TABLET | Freq: Every day | ORAL | Status: DC
  Administered 2013-02-27: 10:00:00 50 mg via ORAL
  Filled 2013-02-26: qty 1

## 2013-02-26 MED ORDER — BIVALIRUDIN 250 MG IV SOLR
INTRAVENOUS | Status: AC
  Filled 2013-02-26: qty 250

## 2013-02-26 MED ORDER — CLOPIDOGREL BISULFATE 75 MG PO TABS
75.0000 mg | ORAL_TABLET | Freq: Every day | ORAL | Status: DC
  Administered 2013-02-27: 07:00:00 75 mg via ORAL
  Filled 2013-02-26: qty 1

## 2013-02-26 MED ORDER — ACETAMINOPHEN 325 MG PO TABS: 650.0000 mg | ORAL_TABLET | ORAL | Status: DC | PRN

## 2013-02-26 NOTE — H&P (View-Only) (Signed)
 Clinical Summary Hunter Townsend is a 72 y.o.male presenting for followup. He was last seen in June. Interval records indicate observation at [facility] in August with  chest pain. He ruled out for myocardial infarction by enzymes. Lab work revealed potassium 3.7, BUN 10, creatinine 0.9, d-dimer less than 0.27, hemoglobin 14.0, platelets 266. Chest x-ray showed no acute process. Faxed copy of ECG showed sinus rhythm with left atrial enlargement and nonspecific ST changes.  We reviewed his symptoms today. He continues to report increasing episodes of fatigue and exertional chest tightness reminiscent of his angina. He reports that nitroglycerin is helpful. He has otherwise been on a stable medical regimen.  Last intervention was in January 2013 as outlined below.  No Known Allergies  Current Outpatient Prescriptions  Medication Sig Dispense Refill  . aspirin EC 81 MG tablet Take 81 mg by mouth daily.      . COREG 6.25 MG tablet TAKE 1 TABLET BY MOUTH TWICE DAILY WITH A MEAL.  60 each  6  . isosorbide mononitrate (IMDUR) 30 MG 24 hr tablet Take 30 mg by mouth 2 (two) times daily.       . LORazepam (ATIVAN) 1 MG tablet Take 1 mg by mouth 2 (two) times daily as needed. for sleep      . losartan (COZAAR) 50 MG tablet TAKE (1) TABLET BY MOUTH ONCE DAILY.  30 tablet  1  . nitroGLYCERIN (NITROSTAT) 0.4 MG SL tablet Place 1 tablet (0.4 mg total) under the tongue every 5 (five) minutes as needed. For chest pain  25 tablet  3  . pantoprazole (PROTONIX) 40 MG tablet Take 1 tablet by mouth daily.        No current facility-administered medications for this visit.    Past Medical History  Diagnosis Date  . BPH (benign prostatic hyperplasia)   . Coronary atherosclerosis of native coronary artery     Multivessel, NSTEMI  s/p PTCA/DES to mid LAD 06/24/11,, known patent LIMA-LAD and occlusion of all vein grafts  . Hypertension   . Arthritis   . Ischemic cardiomyopathy     EF 35-40% by cath 06/24/11 (EF 37%  by nuclear stress test 06/2010).  . PAD (peripheral artery disease)     Status post left SFA stent - Dr. Cooper    Past Surgical History  Procedure Laterality Date  . Coronary artery bypass graft  1987    [place], [place] - LIMA to LAD, SVG to RCA presumably  . Back surgery      Family History  Problem Relation Age of Onset  . CAD    . Hypertension      Social History Hunter Townsend reports that he quit smoking about 19 years ago. His smoking use included Cigarettes. He has a 80 pack-year smoking history. He has never used smokeless tobacco. Hunter Townsend reports that he does not drink alcohol.  Review of Systems No palpitations or syncope. NYHA class 2-3 dyspnea. No reported bleeding episodes. Improved claudication. Otherwise negative except as outlined.  Physical Examination Filed Vitals:   02/23/13 1103  BP: 133/86  Pulse: 97   Filed Weights   02/23/13 1103  Weight: 172 lb (78.019 kg)    Normally nourished appearing male, comfortable at rest.  HEENT: Conjunctiva and lids normal, oropharynx with moist mucosa.  Neck: Supple, no elevated JVP or bruits. No thyromegaly.  Lungs: Clear auscultation, nonlabored.  Cardiac: Regular rate and rhythm, soft systolic murmur, split S2, no gallop.  Abdomen: Soft, nontender, bowel sounds present.    Skin: Warm and dry.  Extremities: No pitting edema. Distal pulses 1+.  Musculoskeletal: No kyphosis.  Neuropsychiatric: Alert and oriented x3, affect appropriate.    Problem List and Plan   Accelerating angina Increasing occurrence of exertional fatigue and angina on reasonable medical regimen, responsive to nitroglycerin. He has multivessel disease with known graft disease, last intervention being DES to LAD in January 2013. We have discussed options for evaluation, and plan is to proceed with a diagnostic cardiac catheterization to best determine if there are any revascularization options to consider. This is being scheduled Dr. Cooper on  Friday.  Coronary atherosclerosis of native coronary artery Multivessel status post prior CABG with more recent PTCA/DES to mid LAD in January 2013. LVEF 50-55% as of May 2013.  Essential hypertension, benign Blood pressure control reasonable today. No changes made.  Peripheral arterial disease Improved claudication status post left SFA stent by Dr. Cooper.    Hunter Townsend, M.D., F.A.C.C.   

## 2013-02-26 NOTE — Interval H&P Note (Signed)
History and Physical Interval Note:  02/26/2013 12:27 PM  Hunter Townsend  has presented today for surgery, with the diagnosis of Chest pain  The various methods of treatment have been discussed with the patient and family. After consideration of risks, benefits and other options for treatment, the patient has consented to  Procedure(s): LEFT HEART CATHETERIZATION WITH CORONARY/GRAFT ANGIOGRAM (N/A) as a surgical intervention .  The patient's history has been reviewed, patient examined, no change in status, stable for surgery.  I have reviewed the patient's chart and labs.  Questions were answered to the patient's satisfaction.    Cath Lab Visit (complete for each Cath Lab visit)  Clinical Evaluation Leading to the Procedure:   ACS: no  Non-ACS:    Anginal Classification: CCS III  Anti-ischemic medical therapy: Maximal Therapy (2 or more classes of medications)  Non-Invasive Test Results: No non-invasive testing performed  Prior CABG: Previous CABG         Tonny Bollman

## 2013-02-26 NOTE — Progress Notes (Signed)
TR BAND REMOVAL  LOCATION:  left radial  DEFLATED PER PROTOCOL:  yes  TIME BAND OFF / DRESSING APPLIED:   1727   SITE UPON ARRIVAL:   Level 1  SITE AFTER BAND REMOVAL:  Level 1  REVERSE ALLEN'S TEST:    positive  CIRCULATION SENSATION AND MOVEMENT:  Within Normal Limits  yes  COMMENTS:  bruise

## 2013-02-26 NOTE — CV Procedure (Signed)
    Cardiac Catheterization Procedure Note  Name: Hunter Townsend MRN: [id number] DOB: [birth year]  Procedure: Left Heart Cath, Selective Coronary Angiography, LV angiography, abdominal aortic angiography, PTCA and stenting of the LIMA/LAD anastomosis  Indication: CCS Class 3 angina on maximal medical therapy  Procedural Details:  The left wrist was prepped, draped, and anesthetized with 1% lidocaine. Using the modified Seldinger technique, a 5/6 French slender sheath was introduced into the left radial artery. 3 mg of verapamil was administered through the sheath, weight-based unfractionated heparin was administered intravenously. Standard Judkins catheters were used for selective coronary angiography and left ventriculography. Catheter exchanges were performed over an exchange length guidewire.  PROCEDURAL FINDINGS Hemodynamics: AO 136/83 LV 133/16   Coronary angiography: Coronary dominance: right  Left mainstem: mild calcific disease.   Left anterior descending (LAD): Severe calcification. 100% occlusion at the ostium.  Left circumflex (LCx): Moderate calcification, 50% mid-vessel stenosis. Moderate stenosis at the OM bifucation with 60-70% stenosis involving the ostium of the ostia of both vessels  Right coronary artery (RCA): 100% ostial occlusion. Collateralized from the LCx.  Left ventriculography: Severe inferior wall hypokinesis. Mild aterolateral hypokinesis. LVEF estimated at 35%.  Abdominal aortic angiography: The renal arteries are widely patent. There is heavy calcification of the iliac arteries. The aorta has diffuse nonobstructive irregularity. The right common iliac has mild disease. The right external iliac and common femoral are patent. The left common iliac at the bifurcation of the internal and external iliac has 80% stenosis. The left common femoral is patent. The left SFA appears patent in its proximal aspect.  PCI Note:  Following the diagnostic procedure, the  decision was made to proceed with PCI. He was loaded with plavix 600 mg.  Weight-based bivalirudin was given for anticoagulation. Once a therapeutic ACT was achieved, a 6 [place] LIMA guide catheter was inserted.  A cougar coronary guidewire was used to cross the lesion.  The lesion was predilated with a 2.5 mm balloon.  The lesion was then stented with a 2.75x15 mm Xience drug-eluting stent.  The stent was postdilated with a 3.0 mm noncompliant balloon.  Following PCI, there was 0% residual stenosis and TIMI-3 flow. Final angiography confirmed an excellent result. The patient tolerated the procedure well. There were no immediate procedural complications. A TR band was used for radial hemostasis. The patient was transferred to the post catheterization recovery area for further monitoring.  PCI Data: Vessel - LAD/Segment - mid (LIMA anastomosis) Percent Stenosis (pre)  95 TIMI-flow 3 Stent 2.75x15 mm Xience DES Percent Stenosis (post) 0 TIMI-flow (post) 3  Final Conclusions:   1. Severe 3 vessel CAD\ 2. S/P CABG with known occlusion of all SVG's and severe stenosis of the LIMA-LAD 3. Successful PCI of the LIMA/LAD 4. Moderate/severe LV dysfunction  5. Severe Left external iliac stenosis  Recommendations:  ASA/plavix greater than 12 months and favor long-term if tolerated. PV eval for consideration of left iliac stenting.  Tonny Bollman 02/26/2013, 2:00 PM

## 2013-02-27 DIAGNOSIS — I10 Essential (primary) hypertension: Secondary | ICD-10-CM

## 2013-02-27 DIAGNOSIS — I2589 Other forms of chronic ischemic heart disease: Secondary | ICD-10-CM | POA: Diagnosis not present

## 2013-02-27 DIAGNOSIS — I200 Unstable angina: Secondary | ICD-10-CM | POA: Diagnosis not present

## 2013-02-27 DIAGNOSIS — I2510 Atherosclerotic heart disease of native coronary artery without angina pectoris: Secondary | ICD-10-CM | POA: Diagnosis not present

## 2013-02-27 DIAGNOSIS — I25810 Atherosclerosis of coronary artery bypass graft(s) without angina pectoris: Secondary | ICD-10-CM | POA: Diagnosis not present

## 2013-02-27 LAB — CBC
HCT: 38.1 % — ABNORMAL LOW (ref 39.0–52.0)
MCHC: 34.9 g/dL (ref 30.0–36.0)
MCV: 92 fL (ref 78.0–100.0)
Platelets: 256 10*3/uL (ref 150–400)
RBC: 4.14 MIL/uL — ABNORMAL LOW (ref 4.22–5.81)
RDW: 12.5 % (ref 11.5–15.5)
WBC: 8.2 10*3/uL (ref 4.0–10.5)

## 2013-02-27 LAB — BASIC METABOLIC PANEL
BUN: 10 mg/dL (ref 6–23)
Calcium: 8.6 mg/dL (ref 8.4–10.5)
Creatinine, Ser: 0.85 mg/dL (ref 0.50–1.35)
GFR calc Af Amer: >90 mL/min (ref >90)
GFR calc non Af Amer: 84 mL/min — ABNORMAL LOW (ref >90)

## 2013-02-27 MED ORDER — CARVEDILOL 12.5 MG PO TABS: 12.5000 mg | ORAL_TABLET | Freq: Two times a day (BID) | ORAL | Status: DC

## 2013-02-27 MED ORDER — CLOPIDOGREL BISULFATE 75 MG PO TABS: 75.0000 mg | ORAL_TABLET | Freq: Every day | ORAL | Status: DC

## 2013-02-27 NOTE — Progress Notes (Signed)
Patient given hydralazine 10 mg IV for HTN at 0730.

## 2013-02-27 NOTE — Discharge Summary (Signed)
Physician Discharge Summary  Patient ID: Hunter Townsend MRN: [id number] DOB/AGE: March 20, 1941 72 y.o.  Admit date: 02/26/2013 Discharge date: 02/27/2013  Primary Discharge Diagnosis: 1.CAD    A: S/P PCI of LIMA- LAD 02/26/13 Secondary Discharge Diagnosis" 1. Hypertension 2. PAD    A: 80% left common iliac at the bifurcation of the internal and external iliac has 80% stenosis per aortic angiography.    B. Hx of Left SFA stent 3. ICM with Systolic Dyfunction: EF of 35%  Significant Diagnostic Studies: 1.Cardiac Cath 02/26/13 PCI Data:  Vessel - LAD/Segment - mid (LIMA anastomosis)  Percent Stenosis (pre) 95  TIMI-flow 3  Stent 2.75x15 mm Xience DES  Percent Stenosis (post) 0  TIMI-flow (post) 3  Final Conclusions:  1. Severe 3 vessel CAD\  2. S/P CABG with known occlusion of all SVG's and severe stenosis of the LIMA-LAD  3. Successful PCI of the LIMA/LAD  4. Moderate/severe LV dysfunction  5. Severe Left external iliac stenosis  2. Aortic Angiography 02/26/2013 Abdominal aortic angiography: The renal arteries are widely patent. There is heavy calcification of the iliac arteries. The aorta has diffuse nonobstructive irregularity. The right common iliac has mild disease. The right external iliac and common femoral are patent. The left common iliac at the bifurcation of the internal and external iliac has 80% stenosis. The left common femoral is patent. The left SFA appears patent in its proximal aspect.  Hospital Course:  Hunter Townsend is a 72 year old patient admitted for cardiac catheterization which demonstrate  severe stenosis of the LIMA to LAD for cardiac catheterization completed on 02/26/2013 per Dr. Excell Seltzer. He has a history of coronary artery disease with coronary artery bypass grafting, along with peripheral arterial disease status post stenting of the SFA on the left by Dr. Excell Seltzer in the past. He was found to have significant systolic dysfunction with an EF of 35%. The  patient tolerated the procedure well, but remained hypertensive throughout hospitalization. The patient was placed on dual antiplatelet therapy to include Plavix and aspirin. Due to persistent hypertension the patient's Coreg was increased to 12.5 milligrams twice a day. Consideration for ICD pacemaker will be discussed by Dr. Diona Browner on followup after repeat echocardiogram in approximately 3 months at his discretion. The patient was seen and examined by Dr. Jens Som on hospital rounds and found to be stable to return home. It is noted that the patient is not on a statin, and has not had any allergies listed for same. Dr. Jens Som wishes to defer to Dr. Diona Browner about instituting this medication on followup.  Discharge Exam: Blood pressure 153/73, pulse 78, temperature 97.5 F (36.4 C), temperature source Oral, resp. rate 18, height 5\' 5"  (1.651 m), weight 171 lb 4.8 oz (77.7 kg), SpO2 97.00%. Labs:   Lab Results  Component Value Date   WBC 8.2 02/27/2013   HGB 13.3 02/27/2013   HCT 38.1* 02/27/2013   MCV 92.0 02/27/2013   PLT 256 02/27/2013    Recent Labs Lab 02/27/13 0641  NA 144  K 3.8  CL 108  CO2 24  BUN 10  CREATININE 0.85  CALCIUM 8.6  GLUCOSE 109*    WUJ:WJXBJY sinus rhythm rate of 77 bpm. Cannot rule out Anterior infarct , age undetermined Possible Inferior infarct, age undetermined  FOLLOW UP PLANS AND APPOINTMENTS Discharge Orders   Future Orders Complete By Expires   Diet - low sodium heart healthy  As directed    Increase activity slowly  As directed  Medication List         aspirin EC 81 MG tablet  Take 81 mg by mouth daily.     carvedilol 12.5 MG tablet  Commonly known as:  COREG  Take 1 tablet (12.5 mg total) by mouth 2 (two) times daily with a meal.     clopidogrel 75 MG tablet  Commonly known as:  PLAVIX  Take 1 tablet (75 mg total) by mouth daily with breakfast.     gabapentin 100 MG capsule  Commonly known as:  NEURONTIN  Take 100 mg by  mouth 3 (three) times daily.     isosorbide mononitrate 30 MG 24 hr tablet  Commonly known as:  IMDUR  Take 30 mg by mouth 2 (two) times daily.     LORazepam 1 MG tablet  Commonly known as:  ATIVAN  Take 1 mg by mouth 2 (two) times daily as needed for anxiety (sleep).     losartan 50 MG tablet  Commonly known as:  COZAAR  Take 50 mg by mouth daily.     nitroGLYCERIN 0.4 MG SL tablet  Commonly known as:  NITROSTAT  Place 1 tablet (0.4 mg total) under the tongue every 5 (five) minutes as needed. For chest pain     pantoprazole 40 MG tablet  Commonly known as:  PROTONIX  Take 40 mg by mouth daily.        Time spent with patient to include physician time:45 minutes  Signed: Joni Reining 02/27/2013, 8:52 AM Co-Sign MD

## 2013-02-27 NOTE — Progress Notes (Signed)
CARDIAC REHAB PHASE I   PRE:  Rate/Rhythm: 91 SR  BP:  Sitting: 154/88     SaO2: 98 RA  MODE:  Ambulation: 950 ft   POST:  Rate/Rhythm: 108 SR  BP:  Sitting: 173/94, recheck 162/94    SaO2: 99 RA  Pt walked 950 ft with no c/o, independently, with steady gait at a quick pace.  Reviewed education and NTG use.  Pt interested in CRP II in [place].  Returned pt to bed with call button and phone. [phone]  Marvene Staff MS, ACSM RCEP 9:29 AM 02/27/2013

## 2013-02-27 NOTE — Discharge Summary (Signed)
See progress notes Hunter Townsend  

## 2013-02-27 NOTE — Progress Notes (Addendum)
Consulting cardiologist: Excell Seltzer  Subjective:    Feels good. No chest pain. Ready to go home.  Objective:   Temp:  [97.5 F (36.4 C)-98.4 F (36.9 C)] 97.5 F (36.4 C) (10/04 0735) Pulse Rate:  [59-98] 78 (10/04 0735) Resp:  [18] 18 (10/04 0735) BP: (128-173)/(69-94) 173/84 mmHg (10/04 0735) SpO2:  [93 %-98 %] 97 % (10/04 0735) Weight:  [171 lb 4.8 oz (77.7 kg)-172 lb (78.019 kg)] 171 lb 4.8 oz (77.7 kg) (10/04 0019) Last BM Date: 02/25/13  Filed Weights   02/26/13 0820 02/27/13 0019  Weight: 172 lb (78.019 kg) 171 lb 4.8 oz (77.7 kg)    Intake/Output Summary (Last 24 hours) at 02/27/13 0805 Last data filed at 02/26/13 2300  Gross per 24 hour  Intake   1182 ml  Output   1300 ml  Net   -118 ml    Telemetry:NSR int he 70's.   Exam:  General: No acute distress.  HEENT: Conjunctiva and lids normal, oropharynx clear.  Lungs: Clear to auscultation, nonlabored.  Cardiac: No elevated JVP or bruits. RRR, no gallop or rub.   Abdomen: Normoactive bowel sounds, nontender, nondistended.  Extremities: No pitting edema, distal pulses full. Left wrist catheter insertion site is healthy, without evidence of hematoma or bleeding.  Neuropsychiatric: Alert and oriented x3, affect appropriate.   Lab Results:  Basic Metabolic Panel:  Recent Labs Lab 02/23/13 1134 02/27/13 0641  NA 139 144  K 4.6 3.8  CL 102 108  CO2 29 24  GLUCOSE 107* 109*  BUN 11 10  CREATININE 0.93 0.85  CALCIUM 9.7 8.6    CBC:  Recent Labs Lab 02/23/13 1134 02/27/13 0641  WBC 7.5 8.2  HGB 13.7 13.3  HCT 39.1 38.1*  MCV 93.5 92.0  PLT 273 256      Coagulation:  Recent Labs Lab 02/23/13 1134  INR 1.19     Medications:   Scheduled Medications: . aspirin EC  81 mg Oral Daily  . carvedilol  6.25 mg Oral BID WC  . clopidogrel  75 mg Oral Q breakfast  . gabapentin  100 mg Oral TID  . isosorbide mononitrate  30 mg Oral BID  . losartan  50 mg Oral Daily  . pantoprazole  40  mg Oral Daily          PRN Medications:  acetaminophen, hydrALAZINE, LORazepam, nitroGLYCERIN, ondansetron (ZOFRAN) IV   Assessment and Plan:   1. CAD: Hx of CABG with recurrent angina. Underwent cardiac cath on 02/26/13 with PCI of the LIMA to LAD. LV gram demonstrated EF of 35%. He is now on DAPT with Plavix and ASA. He remains on coreg 6.25 mg BID, but may need to titrate up. He is on ARB as well. Creatinine 0.85 this am. He is pain free.  2. PAD: Aortogram demonstrated severe 80% left common iliac at the bifurcation of the internal and external iliac has 80% stenosis. The left common femoral is patent. The left SFA appears patent in its proximal aspect. Will need follow up with Dr. Diona Browner in [place] for further plans.   3. Hypertension: BP is still slightly elevated. He states his BP is always up when he is in hospital or sees physician. Consider increasing coreg with systolic dysfunction to 12.50 mg BID.  Bettey Mare. Lyman Bishop NP [facility] 02/27/2013, 8:05 AM As above, patient seen and examined. He is doing well following PCI this morning. No chest pain or dyspnea. Radial cath site with no hematoma.  Plan discharge today on aspirin and Plavix. His blood pressure is mildly elevated. Increase carvedilol to 12.5 mg by mouth twice a day. Further medication titration as an outpatient. Repeat echocardiogram in 3 months following revascularization. His ejection fraction less than 35% consider ICD. Followup with Dr. Diona Browner in approximately 4 weeks. He will need further evaluation of his peripheral vascular disease as an outpatient. Would consider statin as outpatient. > 30 min PA and physician time D2 Olga Millers

## 2013-03-02 MED FILL — Sodium Chloride IV Soln 0.9%: INTRAVENOUS | Qty: 50 | Status: AC

## 2013-03-10 DIAGNOSIS — M25579 Pain in unspecified ankle and joints of unspecified foot: Secondary | ICD-10-CM | POA: Diagnosis not present

## 2013-03-10 DIAGNOSIS — M79609 Pain in unspecified limb: Secondary | ICD-10-CM | POA: Diagnosis not present

## 2013-03-18 ENCOUNTER — Encounter (HOSPITAL_COMMUNITY)
Admission: RE | Admit: 2013-03-18 | Discharge: 2013-03-18 | Disposition: A | Discharge status: Home / Self Care | Payer: Medicare Other | Source: Ambulatory Visit | Attending: Cardiology | Admitting: Cardiology

## 2013-03-18 VITALS — BP 160/80 | HR 66 | Ht 65.0 in | Wt 174.8 lb

## 2013-03-18 DIAGNOSIS — Z5189 Encounter for other specified aftercare: Secondary | ICD-10-CM | POA: Insufficient documentation

## 2013-03-18 DIAGNOSIS — Z9861 Coronary angioplasty status: Secondary | ICD-10-CM | POA: Insufficient documentation

## 2013-03-18 NOTE — Patient Instructions (Signed)
Orientation completed. Pt is scheduled to start on Monday 03/22/13 at 11:00. Pt is registered and records can be found in EPIC. Pt is eager to get started.

## 2013-03-18 NOTE — Progress Notes (Addendum)
Patient was referred to Cardiac Rehab by Dr. Diona Browner due to recent Stent placement V45.82. During orientation advised patient on arrival and appointment times what to wear, what to do before, during and after exercise. Reviewed attendance and class policy. Talked about inclement weather and class consultation policy. Pt is scheduled to start Cardiac Rehab on 03/22/13 at 11:00. Pt was advised to come to class 5 minutes before class starts. He was also given instructions on meeting with the dietician and attending the Family Structure classes. Pt is eager to get started. Patient will be able to complete 6 minute walk test. Discussed results of patients MEDFICT: Dietary Assessment Questionnaire. Handout given on areas in his diet he can change to improve his diet further. Patient expresses an understanding.   Hart [place], MS Cardiac Rehab Manager

## 2013-03-22 ENCOUNTER — Encounter (HOSPITAL_COMMUNITY)
Admission: RE | Admit: 2013-03-22 | Discharge: 2013-03-22 | Disposition: A | Discharge status: Home / Self Care | Payer: Medicare Other | Source: Ambulatory Visit | Attending: Cardiology | Admitting: Cardiology

## 2013-03-22 DIAGNOSIS — Z9861 Coronary angioplasty status: Secondary | ICD-10-CM | POA: Diagnosis not present

## 2013-03-22 DIAGNOSIS — Z5189 Encounter for other specified aftercare: Secondary | ICD-10-CM | POA: Diagnosis not present

## 2013-03-24 ENCOUNTER — Encounter (HOSPITAL_COMMUNITY)
Admission: RE | Admit: 2013-03-24 | Discharge: 2013-03-24 | Disposition: A | Discharge status: Home / Self Care | Payer: Medicare Other | Source: Ambulatory Visit | Attending: Cardiology | Admitting: Cardiology

## 2013-03-26 ENCOUNTER — Encounter (HOSPITAL_COMMUNITY)
Admission: RE | Admit: 2013-03-26 | Discharge: 2013-03-26 | Disposition: A | Discharge status: Home / Self Care | Payer: Medicare Other | Source: Ambulatory Visit | Attending: Cardiology | Admitting: Cardiology

## 2013-03-29 ENCOUNTER — Ambulatory Visit (INDEPENDENT_AMBULATORY_CARE_PROVIDER_SITE_OTHER): Payer: Medicare Other | Admitting: Cardiology

## 2013-03-29 ENCOUNTER — Encounter (HOSPITAL_COMMUNITY): Payer: Medicare Other

## 2013-03-29 ENCOUNTER — Encounter: Payer: Self-pay | Admitting: Cardiology

## 2013-03-29 VITALS — BP 107/67 | HR 88 | Ht 65.0 in | Wt 173.1 lb

## 2013-03-29 DIAGNOSIS — I2589 Other forms of chronic ischemic heart disease: Secondary | ICD-10-CM

## 2013-03-29 DIAGNOSIS — I739 Peripheral vascular disease, unspecified: Secondary | ICD-10-CM

## 2013-03-29 DIAGNOSIS — I2510 Atherosclerotic heart disease of native coronary artery without angina pectoris: Secondary | ICD-10-CM

## 2013-03-29 DIAGNOSIS — I255 Ischemic cardiomyopathy: Secondary | ICD-10-CM

## 2013-03-29 NOTE — Assessment & Plan Note (Signed)
Most recent LVEF 35% at ventriculography. Continue medical therapy and exercise, followup echocardiogram for visit in 3 months.

## 2013-03-29 NOTE — Assessment & Plan Note (Signed)
Symptomatically improved status post recent DES to the mid LAD at LIMA insertion site. Continue medical therapy and cardiac rehabilitation.

## 2013-03-29 NOTE — Patient Instructions (Signed)
Your physician recommends that you schedule a follow-up appointment in: 3 months. Your physician recommends that you continue on your current medications as directed. Please refer to the Current Medication list given to you today. Your physician has requested that you have an echocardiogram in 3 months just before your next visit. Echocardiography is a painless test that uses sound waves to create images of your heart. It provides your doctor with information about the size and shape of your heart and how well your heart's chambers and valves are working. This procedure takes approximately one hour. There are no restrictions for this procedure. You are being referred to Dr. Excell Seltzer.

## 2013-03-29 NOTE — Progress Notes (Signed)
Clinical Summary Hunter Townsend is a 72 y.o.male last seen in September. I referred him for a followup cardiac catheterization due to accelerating angina symptoms. This was performed by Dr. Excell Seltzer demonstrating severe native vessel CAD status post CABG, occlusion of all vein grafts with severe stenosis of the LIMA to LAD. He underwent placement of DES at the mid LAD LIMA anastomosis site. Also incidentally noted to have 80% stenosis at the bifurcation of the internal and external iliac on the left. LVEF was estimated at 35% with anterolateral hypokinesis by ventriculography.  Recent lab work showed potassium 3.8, BUN 10, creatinine 0.8, hemoglobin 13.3, platelets 256.  He states he feels much better at this point. Reports compliance with medications. He is now reenrolled in cardiac rehabilitation at [facility].  Does report occasional leg pain, sometimes left-sided, but not all with exertion.   No Known Allergies  Current Outpatient Prescriptions  Medication Sig Dispense Refill  . aspirin EC 81 MG tablet Take 81 mg by mouth daily.      . carvedilol (COREG) 12.5 MG tablet Take 1 tablet (12.5 mg total) by mouth 2 (two) times daily with a meal.  60 tablet  6  . clopidogrel (PLAVIX) 75 MG tablet Take 1 tablet (75 mg total) by mouth daily with breakfast.  30 tablet  12  . gabapentin (NEURONTIN) 100 MG capsule Take 100 mg by mouth 3 (three) times daily.      . isosorbide mononitrate (IMDUR) 30 MG 24 hr tablet Take 30 mg by mouth 2 (two) times daily.       Marland Kitchen LORazepam (ATIVAN) 1 MG tablet Take 1 mg by mouth 2 (two) times daily as needed for anxiety (sleep).       . losartan (COZAAR) 50 MG tablet Take 50 mg by mouth daily.      . nitroGLYCERIN (NITROSTAT) 0.4 MG SL tablet Place 1 tablet (0.4 mg total) under the tongue every 5 (five) minutes as needed. For chest pain  25 tablet  3  . pantoprazole (PROTONIX) 40 MG tablet Take 40 mg by mouth daily.        No current facility-administered medications  for this visit.    Past Medical History  Diagnosis Date  . BPH (benign prostatic hyperplasia)   . Coronary atherosclerosis of native coronary artery     Multivessel, NSTEMI  s/p PTCA/DES to mid LAD 06/24/11,, known patent LIMA-LAD and occlusion of all vein grafts  . Hypertension   . Arthritis   . Ischemic cardiomyopathy     EF 35-40% by cath 06/24/11 (EF 37% by nuclear stress test 06/2010).  Marland Kitchen PAD (peripheral artery disease)     Status post left SFA stent - Dr. Excell Seltzer    Past Surgical History  Procedure Laterality Date  . Coronary artery bypass graft  1987    [place], [place] - LIMA to LAD, SVG to RCA presumably  . Back surgery      Social History Hunter Townsend reports that he quit smoking about 19 years ago. His smoking use included Cigarettes. He has a 80 pack-year smoking history. He has never used smokeless tobacco. Hunter Townsend reports that he does not drink alcohol.  Review of Systems Negative except as outlined above.  Physical Examination Filed Vitals:   03/29/13 1024  BP: 107/67  Pulse: 88   Filed Weights   03/29/13 1024  Weight: 173 lb 1.9 oz (78.527 kg)    Normally nourished appearing male, comfortable at rest.  HEENT: Conjunctiva and  lids normal, oropharynx with moist mucosa.  Neck: Supple, no elevated JVP or bruits. No thyromegaly.  Lungs: Clear auscultation, nonlabored.  Cardiac: Regular rate and rhythm, soft systolic murmur, split S2, no gallop.  Abdomen: Soft, nontender, bowel sounds present.  Skin: Warm and dry.  Extremities: No pitting edema. Distal pulses 1+.  Musculoskeletal: No kyphosis.  Neuropsychiatric: Alert and oriented x3, affect appropriate.    Problem List and Plan   Coronary atherosclerosis of native coronary artery Symptomatically improved status post recent DES to the mid LAD at LIMA insertion site. Continue medical therapy and cardiac rehabilitation.   Ischemic cardiomyopathy Most recent LVEF 35% at ventriculography. Continue medical  therapy and exercise, followup echocardiogram for visit in 3 months.  Peripheral arterial disease Keep followup with Dr. Excell Seltzer, visit being arranged. Patient does have history of claudication with intermittent leg pain, some of which is atypical. Did have an 80% stenosis at the bifurcation of the internal and external iliac on left at recent angiography.    Jonelle Sidle, M.D., F.A.C.C.

## 2013-03-29 NOTE — Assessment & Plan Note (Signed)
Keep followup with Dr. Excell Seltzer, visit being arranged. Patient does have history of claudication with intermittent leg pain, some of which is atypical. Did have an 80% stenosis at the bifurcation of the internal and external iliac on left at recent angiography.

## 2013-03-31 ENCOUNTER — Encounter (HOSPITAL_COMMUNITY)
Admission: RE | Admit: 2013-03-31 | Discharge: 2013-03-31 | Disposition: A | Discharge status: Home / Self Care | Payer: Medicare Other | Source: Ambulatory Visit | Attending: Cardiology | Admitting: Cardiology

## 2013-03-31 DIAGNOSIS — Z5189 Encounter for other specified aftercare: Secondary | ICD-10-CM | POA: Diagnosis not present

## 2013-03-31 DIAGNOSIS — Z9861 Coronary angioplasty status: Secondary | ICD-10-CM | POA: Insufficient documentation

## 2013-04-01 ENCOUNTER — Other Ambulatory Visit: Payer: Self-pay

## 2013-04-02 ENCOUNTER — Encounter (HOSPITAL_COMMUNITY)
Admission: RE | Admit: 2013-04-02 | Discharge: 2013-04-02 | Disposition: A | Discharge status: Home / Self Care | Payer: Medicare Other | Source: Ambulatory Visit | Attending: Cardiology | Admitting: Cardiology

## 2013-04-02 DIAGNOSIS — Z9861 Coronary angioplasty status: Secondary | ICD-10-CM | POA: Diagnosis not present

## 2013-04-02 DIAGNOSIS — Z5189 Encounter for other specified aftercare: Secondary | ICD-10-CM | POA: Diagnosis not present

## 2013-04-05 ENCOUNTER — Encounter (HOSPITAL_COMMUNITY)
Admission: RE | Admit: 2013-04-05 | Discharge: 2013-04-05 | Disposition: A | Discharge status: Home / Self Care | Payer: Medicare Other | Source: Ambulatory Visit | Attending: Cardiology | Admitting: Cardiology

## 2013-04-05 DIAGNOSIS — Z5189 Encounter for other specified aftercare: Secondary | ICD-10-CM | POA: Diagnosis not present

## 2013-04-05 DIAGNOSIS — Z9861 Coronary angioplasty status: Secondary | ICD-10-CM | POA: Diagnosis not present

## 2013-04-07 ENCOUNTER — Encounter (HOSPITAL_COMMUNITY)
Admission: RE | Admit: 2013-04-07 | Discharge: 2013-04-07 | Disposition: A | Discharge status: Home / Self Care | Payer: Medicare Other | Source: Ambulatory Visit | Attending: Cardiology | Admitting: Cardiology

## 2013-04-07 DIAGNOSIS — Z9861 Coronary angioplasty status: Secondary | ICD-10-CM | POA: Diagnosis not present

## 2013-04-07 DIAGNOSIS — Z5189 Encounter for other specified aftercare: Secondary | ICD-10-CM | POA: Diagnosis not present

## 2013-04-09 ENCOUNTER — Encounter (HOSPITAL_COMMUNITY)
Admission: RE | Admit: 2013-04-09 | Discharge: 2013-04-09 | Disposition: A | Discharge status: Home / Self Care | Payer: Medicare Other | Source: Ambulatory Visit | Attending: Cardiology | Admitting: Cardiology

## 2013-04-09 DIAGNOSIS — Z9861 Coronary angioplasty status: Secondary | ICD-10-CM | POA: Diagnosis not present

## 2013-04-09 DIAGNOSIS — Z5189 Encounter for other specified aftercare: Secondary | ICD-10-CM | POA: Diagnosis not present

## 2013-04-12 ENCOUNTER — Encounter (HOSPITAL_COMMUNITY)
Admission: RE | Admit: 2013-04-12 | Discharge: 2013-04-12 | Disposition: A | Discharge status: Home / Self Care | Payer: Medicare Other | Source: Ambulatory Visit | Attending: Cardiology | Admitting: Cardiology

## 2013-04-12 DIAGNOSIS — Z9861 Coronary angioplasty status: Secondary | ICD-10-CM | POA: Diagnosis not present

## 2013-04-12 DIAGNOSIS — Z5189 Encounter for other specified aftercare: Secondary | ICD-10-CM | POA: Diagnosis not present

## 2013-04-14 ENCOUNTER — Encounter (HOSPITAL_COMMUNITY)
Admission: RE | Admit: 2013-04-14 | Discharge: 2013-04-14 | Disposition: A | Discharge status: Home / Self Care | Payer: Medicare Other | Source: Ambulatory Visit | Attending: Cardiology | Admitting: Cardiology

## 2013-04-14 DIAGNOSIS — Z5189 Encounter for other specified aftercare: Secondary | ICD-10-CM | POA: Diagnosis not present

## 2013-04-14 DIAGNOSIS — Z9861 Coronary angioplasty status: Secondary | ICD-10-CM | POA: Diagnosis not present

## 2013-04-16 ENCOUNTER — Encounter (HOSPITAL_COMMUNITY)
Admission: RE | Admit: 2013-04-16 | Discharge: 2013-04-16 | Disposition: A | Discharge status: Home / Self Care | Payer: Medicare Other | Source: Ambulatory Visit | Attending: Cardiology | Admitting: Cardiology

## 2013-04-16 DIAGNOSIS — Z5189 Encounter for other specified aftercare: Secondary | ICD-10-CM | POA: Diagnosis not present

## 2013-04-16 DIAGNOSIS — Z9861 Coronary angioplasty status: Secondary | ICD-10-CM | POA: Diagnosis not present

## 2013-04-19 ENCOUNTER — Encounter (HOSPITAL_COMMUNITY)
Admission: RE | Admit: 2013-04-19 | Discharge: 2013-04-19 | Disposition: A | Discharge status: Home / Self Care | Payer: Medicare Other | Source: Ambulatory Visit | Attending: Cardiology | Admitting: Cardiology

## 2013-04-19 DIAGNOSIS — Z9861 Coronary angioplasty status: Secondary | ICD-10-CM | POA: Diagnosis not present

## 2013-04-19 DIAGNOSIS — Z5189 Encounter for other specified aftercare: Secondary | ICD-10-CM | POA: Diagnosis not present

## 2013-04-21 ENCOUNTER — Encounter (HOSPITAL_COMMUNITY)
Admission: RE | Admit: 2013-04-21 | Discharge: 2013-04-21 | Disposition: A | Discharge status: Home / Self Care | Payer: Medicare Other | Source: Ambulatory Visit | Attending: Cardiology | Admitting: Cardiology

## 2013-04-21 DIAGNOSIS — Z5189 Encounter for other specified aftercare: Secondary | ICD-10-CM | POA: Diagnosis not present

## 2013-04-21 DIAGNOSIS — Z9861 Coronary angioplasty status: Secondary | ICD-10-CM | POA: Diagnosis not present

## 2013-04-23 ENCOUNTER — Encounter (HOSPITAL_COMMUNITY): Payer: Medicare Other

## 2013-04-26 ENCOUNTER — Encounter (HOSPITAL_COMMUNITY)
Admission: RE | Admit: 2013-04-26 | Discharge: 2013-04-26 | Disposition: A | Discharge status: Home / Self Care | Payer: Medicare Other | Source: Ambulatory Visit | Attending: Cardiology | Admitting: Cardiology

## 2013-04-26 DIAGNOSIS — Z9861 Coronary angioplasty status: Secondary | ICD-10-CM | POA: Insufficient documentation

## 2013-04-26 DIAGNOSIS — Z5189 Encounter for other specified aftercare: Secondary | ICD-10-CM | POA: Diagnosis not present

## 2013-04-28 ENCOUNTER — Encounter (HOSPITAL_COMMUNITY)
Admission: RE | Admit: 2013-04-28 | Discharge: 2013-04-28 | Disposition: A | Discharge status: Home / Self Care | Payer: Medicare Other | Source: Ambulatory Visit | Attending: Cardiology | Admitting: Cardiology

## 2013-04-30 ENCOUNTER — Encounter (HOSPITAL_COMMUNITY)
Admission: RE | Admit: 2013-04-30 | Discharge: 2013-04-30 | Disposition: A | Discharge status: Home / Self Care | Payer: Medicare Other | Source: Ambulatory Visit | Attending: Cardiology | Admitting: Cardiology

## 2013-05-03 ENCOUNTER — Encounter (HOSPITAL_COMMUNITY)
Admission: RE | Admit: 2013-05-03 | Discharge: 2013-05-03 | Disposition: A | Discharge status: Home / Self Care | Payer: Medicare Other | Source: Ambulatory Visit | Attending: Cardiology | Admitting: Cardiology

## 2013-05-04 ENCOUNTER — Ambulatory Visit: Payer: Medicare Other | Admitting: Cardiovascular Disease

## 2013-05-04 DIAGNOSIS — M79609 Pain in unspecified limb: Secondary | ICD-10-CM | POA: Diagnosis not present

## 2013-05-04 DIAGNOSIS — M25579 Pain in unspecified ankle and joints of unspecified foot: Secondary | ICD-10-CM | POA: Diagnosis not present

## 2013-05-05 ENCOUNTER — Encounter (HOSPITAL_COMMUNITY)
Admission: RE | Admit: 2013-05-05 | Discharge: 2013-05-05 | Disposition: A | Discharge status: Home / Self Care | Payer: Medicare Other | Source: Ambulatory Visit | Attending: Cardiology | Admitting: Cardiology

## 2013-05-07 ENCOUNTER — Encounter (HOSPITAL_COMMUNITY)
Admission: RE | Admit: 2013-05-07 | Discharge: 2013-05-07 | Disposition: A | Discharge status: Home / Self Care | Payer: Medicare Other | Source: Ambulatory Visit | Attending: Cardiology | Admitting: Cardiology

## 2013-05-10 ENCOUNTER — Encounter (HOSPITAL_COMMUNITY)
Admission: RE | Admit: 2013-05-10 | Discharge: 2013-05-10 | Disposition: A | Discharge status: Home / Self Care | Payer: Medicare Other | Source: Ambulatory Visit | Attending: Cardiology | Admitting: Cardiology

## 2013-05-12 ENCOUNTER — Encounter (HOSPITAL_COMMUNITY)
Admission: RE | Admit: 2013-05-12 | Discharge: 2013-05-12 | Disposition: A | Discharge status: Home / Self Care | Payer: Medicare Other | Source: Ambulatory Visit | Attending: Cardiology | Admitting: Cardiology

## 2013-05-14 ENCOUNTER — Encounter (HOSPITAL_COMMUNITY)
Admission: RE | Admit: 2013-05-14 | Discharge: 2013-05-14 | Disposition: A | Discharge status: Home / Self Care | Payer: Medicare Other | Source: Ambulatory Visit | Attending: Cardiology | Admitting: Cardiology

## 2013-05-17 ENCOUNTER — Encounter (HOSPITAL_COMMUNITY)
Admission: RE | Admit: 2013-05-17 | Discharge: 2013-05-17 | Disposition: A | Discharge status: Home / Self Care | Payer: Medicare Other | Source: Ambulatory Visit | Attending: Cardiology | Admitting: Cardiology

## 2013-05-19 ENCOUNTER — Encounter (HOSPITAL_COMMUNITY): Payer: Medicare Other

## 2013-05-21 ENCOUNTER — Encounter (HOSPITAL_COMMUNITY)
Admission: RE | Admit: 2013-05-21 | Discharge: 2013-05-21 | Disposition: A | Discharge status: Home / Self Care | Payer: Medicare Other | Source: Ambulatory Visit | Attending: Cardiology | Admitting: Cardiology

## 2013-05-24 ENCOUNTER — Encounter (HOSPITAL_COMMUNITY)
Admission: RE | Admit: 2013-05-24 | Discharge: 2013-05-24 | Disposition: A | Discharge status: Home / Self Care | Payer: Medicare Other | Source: Ambulatory Visit | Attending: Cardiology | Admitting: Cardiology

## 2013-05-26 ENCOUNTER — Encounter (HOSPITAL_COMMUNITY)
Admission: RE | Admit: 2013-05-26 | Discharge: 2013-05-26 | Disposition: A | Discharge status: Home / Self Care | Payer: Medicare Other | Source: Ambulatory Visit | Attending: Cardiology | Admitting: Cardiology

## 2013-05-28 ENCOUNTER — Encounter (HOSPITAL_COMMUNITY)
Admission: RE | Admit: 2013-05-28 | Discharge: 2013-05-28 | Disposition: A | Discharge status: Home / Self Care | Payer: Medicare Other | Source: Ambulatory Visit | Attending: Cardiology | Admitting: Cardiology

## 2013-05-28 DIAGNOSIS — Z9861 Coronary angioplasty status: Secondary | ICD-10-CM | POA: Diagnosis not present

## 2013-05-28 DIAGNOSIS — Z5189 Encounter for other specified aftercare: Secondary | ICD-10-CM | POA: Diagnosis not present

## 2013-05-31 ENCOUNTER — Encounter (HOSPITAL_COMMUNITY)
Admission: RE | Admit: 2013-05-31 | Discharge: 2013-05-31 | Disposition: A | Discharge status: Home / Self Care | Payer: Medicare Other | Source: Ambulatory Visit | Attending: Cardiology | Admitting: Cardiology

## 2013-06-02 ENCOUNTER — Encounter (HOSPITAL_COMMUNITY)
Admission: RE | Admit: 2013-06-02 | Discharge: 2013-06-02 | Disposition: A | Discharge status: Home / Self Care | Payer: Medicare Other | Source: Ambulatory Visit | Attending: Cardiology | Admitting: Cardiology

## 2013-06-03 ENCOUNTER — Ambulatory Visit: Payer: Medicare Other | Admitting: Cardiovascular Disease

## 2013-06-04 ENCOUNTER — Encounter (HOSPITAL_COMMUNITY)
Admission: RE | Admit: 2013-06-04 | Discharge: 2013-06-04 | Disposition: A | Discharge status: Home / Self Care | Payer: Medicare Other | Source: Ambulatory Visit | Attending: Cardiology | Admitting: Cardiology

## 2013-06-07 ENCOUNTER — Encounter (HOSPITAL_COMMUNITY)
Admission: RE | Admit: 2013-06-07 | Discharge: 2013-06-07 | Disposition: A | Discharge status: Home / Self Care | Payer: Medicare Other | Source: Ambulatory Visit | Attending: Cardiology | Admitting: Cardiology

## 2013-06-09 ENCOUNTER — Encounter (HOSPITAL_COMMUNITY)
Admission: RE | Admit: 2013-06-09 | Discharge: 2013-06-09 | Disposition: A | Discharge status: Home / Self Care | Payer: Medicare Other | Source: Ambulatory Visit | Attending: Cardiology | Admitting: Cardiology

## 2013-06-10 ENCOUNTER — Ambulatory Visit (INDEPENDENT_AMBULATORY_CARE_PROVIDER_SITE_OTHER): Payer: Medicare Other | Admitting: Cardiovascular Disease

## 2013-06-10 ENCOUNTER — Encounter: Payer: Self-pay | Admitting: Cardiovascular Disease

## 2013-06-10 VITALS — BP 136/72 | HR 67 | Ht 65.0 in | Wt 170.0 lb

## 2013-06-10 DIAGNOSIS — I70219 Atherosclerosis of native arteries of extremities with intermittent claudication, unspecified extremity: Secondary | ICD-10-CM | POA: Diagnosis not present

## 2013-06-10 NOTE — Patient Instructions (Signed)
Your physician recommends that you schedule a follow-up appointment in: as needed with Dr. Burt Knack. Keep your appointments with [name] in [place].

## 2013-06-10 NOTE — Progress Notes (Signed)
HPI:   73 year old gentleman presenting for followup evaluation. He has coronary and peripheral arterial disease. The patient underwent PCI last year. He has a history of coronary bypass surgery. All of his vein grafts are occluded and he had a tight stenosis in the LIMA to LAD at the distal anastomosis. This was treated with a drug-eluting stent. At that time he was experiencing accelerating symptoms of angina. His anginal symptoms have resolved and he is doing quite well.  He does have a history of lower extremity peripheral arterial disease and he was noted to have 80% left iliac stenosis at cath. He previously had ABIs and duplex studies done in June 2014. The ABIs were normal bilaterally. The lower extremity duplex showed continued patency of the previously implanted left SFA stent.  From a symptomatic perspective, the patient is doing well. He denies chest pain or pressure. He has not had leg pain with activity. He is walking regularly with no symptoms. He denies dyspnea, edema, or palpitations.  Outpatient Encounter Prescriptions as of 06/10/2013  Medication Sig  . aspirin EC 81 MG tablet Take 81 mg by mouth daily.  . carvedilol (COREG) 12.5 MG tablet Take 1 tablet (12.5 mg total) by mouth 2 (two) times daily with a meal.  . clopidogrel (PLAVIX) 75 MG tablet Take 1 tablet (75 mg total) by mouth daily with breakfast.  . gabapentin (NEURONTIN) 100 MG capsule Take 100 mg by mouth 3 (three) times daily.  . isosorbide mononitrate (IMDUR) 30 MG 24 hr tablet Take 30 mg by mouth 2 (two) times daily.   Marland Kitchen LORazepam (ATIVAN) 1 MG tablet Take 1 mg by mouth 2 (two) times daily as needed for anxiety (sleep).   . losartan (COZAAR) 50 MG tablet Take 50 mg by mouth daily.  . nitroGLYCERIN (NITROSTAT) 0.4 MG SL tablet Place 1 tablet (0.4 mg total) under the tongue every 5 (five) minutes as needed. For chest pain  . pantoprazole (PROTONIX) 40 MG tablet Take 40 mg by mouth daily.     No Known  Allergies  Past Medical History  Diagnosis Date  . BPH (benign prostatic hyperplasia)   . Coronary atherosclerosis of native coronary artery     Multivessel, NSTEMI  s/p PTCA/DES to mid LAD 06/24/11,, known patent LIMA-LAD and occlusion of all vein grafts  . Hypertension   . Arthritis   . Ischemic cardiomyopathy     EF 35-40% by cath 06/24/11 (EF 37% by nuclear stress test 06/2010).  Marland Kitchen PAD (peripheral artery disease)     Status post left SFA stent - Dr. Burt Knack    ROS: Negative except as per HPI  BP 136/72  Pulse 67  Ht 5\' 5"  (1.651 m)  Wt 170 lb (77.111 kg)  BMI 28.29 kg/m2  PHYSICAL EXAM: Pt is alert and oriented, NAD HEENT: normal Neck: JVP - normal, carotids 2+= without bruits Lungs: CTA bilaterally CV: RRR with soft systolic murmur Abd: soft, NT, Positive BS, no hepatomegaly Ext: no C/C/E, distal pulses intact and equal Skin: warm/dry no rash  ASSESSMENT AND PLAN: Lower extremity PAD - appears stable. While he had significant iliac stenosis at cath, he appears asymptomatic and ABI's look good. Recommended that he continue on his current medical program. I would be inclined to keep him on aspirin and Plavix lifelong considering his coronary situation. He will continue to followup with [name]. I would be happy to see him anytime in the future if problems arise.  Sherren Mocha 06/10/2013 3:25 PM

## 2013-06-11 ENCOUNTER — Encounter (HOSPITAL_COMMUNITY)
Admission: RE | Admit: 2013-06-11 | Discharge: 2013-06-11 | Disposition: A | Discharge status: Home / Self Care | Payer: Medicare Other | Source: Ambulatory Visit | Attending: Cardiology | Admitting: Cardiology

## 2013-06-14 ENCOUNTER — Encounter (HOSPITAL_COMMUNITY)
Admission: RE | Admit: 2013-06-14 | Discharge: 2013-06-14 | Disposition: A | Discharge status: Home / Self Care | Payer: Medicare Other | Source: Ambulatory Visit | Attending: Cardiology | Admitting: Cardiology

## 2013-06-16 ENCOUNTER — Encounter (HOSPITAL_COMMUNITY)
Admission: RE | Admit: 2013-06-16 | Discharge: 2013-06-16 | Disposition: A | Discharge status: Home / Self Care | Payer: Medicare Other | Source: Ambulatory Visit | Attending: Cardiology | Admitting: Cardiology

## 2013-06-16 DIAGNOSIS — L259 Unspecified contact dermatitis, unspecified cause: Secondary | ICD-10-CM | POA: Diagnosis not present

## 2013-06-18 ENCOUNTER — Encounter (HOSPITAL_COMMUNITY)
Admission: RE | Admit: 2013-06-18 | Discharge: 2013-06-18 | Disposition: A | Discharge status: Home / Self Care | Payer: Medicare Other | Source: Ambulatory Visit | Attending: Cardiology | Admitting: Cardiology

## 2013-06-21 DIAGNOSIS — I739 Peripheral vascular disease, unspecified: Secondary | ICD-10-CM | POA: Diagnosis not present

## 2013-06-21 DIAGNOSIS — I10 Essential (primary) hypertension: Secondary | ICD-10-CM | POA: Diagnosis not present

## 2013-06-21 DIAGNOSIS — I209 Angina pectoris, unspecified: Secondary | ICD-10-CM | POA: Diagnosis not present

## 2013-06-22 NOTE — Progress Notes (Signed)
Cardiac Rehabilitation Program Outcomes Report   Orientation:  03/18/2013 Graduate Date:  06/18/2013 Discharge Date:  06/18/2013 # of sessions completed: 32 DX stentX1  Cardiologist: Grant Ruts MD:  Rory Percy Class Time:  11:00  A.  Exercise Program:  Tolerates exercise @ 3.77 METS for 15 minutes, Walk Test Results:  Post: Post walk Test: Rest HR 68, BP 110/60, O2 96%, RPE 6 and RPD 6, 6 min: HR 102, BP 142/90 O2 96%, RPE 12 and RPD 12, POst HR 101, BP 150/60 O2 99%, RPE 99 and RPE 6 and RPD 6, Walked 1450 feet at 2.74 mph at 3.10 METS. and Discharged to home exercise program.  Anticipated compliance:  excellent  B.  Mental Health:  Good mental attitude  C.  Education/Instruction/Skills  Accurately checks own pulse.  Rest:  68  Exercise:  110, Knows THR for exercise, Uses Perceived Exertion Scale and/or Dyspnea Scale and Attended 12 education classes  Uses Perceived Exertion Scale and/or Dyspnea Scale  D.  Nutrition/Weight Control/Body Composition:  Adherence to prescribed nutrition program: good    E.  Blood Lipids    Lab Results  Component Value Date   CHOL 152 06/25/2011   HDL 28* 06/25/2011   LDLCALC 94 06/25/2011   TRIG 150* 06/25/2011   CHOLHDL 5.4 06/25/2011    F.  Lifestyle Changes:  Making positive lifestyle changes and Not smoking:  Quit 1999  G.  Symptoms noted with exercise:  Asymptomatic  Report Completed By:  Oletta Lamas. Faryn Sieg RN   Comments:  This is patients graduation report He has done very well while in Rehab. He achieved a peak METS of 3.77. His resting HR was 68 and his resting BP was 128/60. His peak HR was 110 and peak BP was 130/70. Staff will make F/U Calls at 1 month,76months and 1 year to enusre compliance of exercise.

## 2013-06-22 NOTE — Addendum Note (Signed)
Encounter addended by: Norlene Duel, RN on: 06/22/2013  8:02 AM<BR>     Documentation filed: Clinical Notes

## 2013-06-22 NOTE — Addendum Note (Signed)
Encounter addended by: Norlene Duel, RN on: 06/22/2013  8:01 AM<BR>     Documentation filed: Notes Section

## 2013-06-22 NOTE — Addendum Note (Signed)
Encounter addended by: Norlene Duel, RN on: 06/22/2013  7:51 AM<BR>     Documentation filed: Notes Section

## 2013-06-22 NOTE — Progress Notes (Signed)
Cardiac Rehabilitation Program Outcomes Report   Orientation:  03/18/2013 Halfway Report: 05/05/2013 Graduate Date:  tbd Discharge Date:  tbd # of sessions completed: 18th DX: Stent X 1  Cardiologist: Gwen Her Family MD:  Rory Percy Class Time:  11:00  A.  Exercise Program:  Tolerates exercise @ 3.77 METS for 15 minutes  B.  Mental Health:  Good mental attitude  C.  Education/Instruction/Skills  Accurately checks own pulse.  Rest:  94  Exercise:  104, Knows THR for exercise and Uses Perceived Exertion Scale and/or Dyspnea Scale  Uses Perceived Exertion Scale and/or Dyspnea Scale  D.  Nutrition/Weight Control/Body Composition:  Adherence to prescribed nutrition program: good    E.  Blood Lipids    Lab Results  Component Value Date   CHOL 152 06/25/2011   HDL 28* 06/25/2011   LDLCALC 94 06/25/2011   TRIG 150* 06/25/2011   CHOLHDL 5.4 06/25/2011    F.  Lifestyle Changes:  Making positive lifestyle changes and Not smoking:  Quit 1999  G.  Symptoms noted with exercise:  Asymptomatic  Report Completed By:  Oletta Lamas. Nathaniel Yaden RN   Comments:  This is patients halfway report. He has done well. He achieved a peak METS of 3.77. His resting HR was 94 and resting BP was 110/70 and his peak HR was 104 and peak BP was 142/80. A graduation report will follow upon his 32 th visit.

## 2013-06-22 NOTE — Progress Notes (Signed)
Cardiac Rehabilitation Program Outcomes Report   Orientation:  03/18/2013 !st week: 1031/2014 Graduate Date:  tbd Discharge Date:  tbd # of sessions completed: 3 DX: Stent x1  Cardiologist: Grant Ruts MD:  Dionne Milo Time:  11:00  A.  Exercise Program:  Tolerates exercise @ 3.54 METS for 15 minutes and Walk Test Results:  Pre: Pre walk Test: Resting HR 67, Bp 110/60, O2 97%, RPE 6, and RPD 6. 6 min HR 92, BP 154/72 O2 99%,  RPE11 and RPD 11, Post HR 98, BP 120/62, O2 99%, RPE 6 and RPD 6. Walked 1250 sq feet at 2.36 mph at 2.8 METS>  B.  Mental Health:  Good mental attitude and Quality of Life (QOL)  improvements:  Overall  24.44 %, Health/Functioning 22.18 %, Socioeconomics 27.86 %, Psych/Spiritual 24.86 %, Family 26.00 %    C.  Education/Instruction/Skills  Accurately checks own pulse.  Rest:  90  Exercise:   105, Knows THR for exercise and Uses Perceived Exertion Scale and/or Dyspnea Scale  Uses Perceived Exertion Scale and/or Dyspnea Scale  D.  Nutrition/Weight Control/Body Composition:  Adherence to prescribed nutrition program: good    E.  Blood Lipids    Lab Results  Component Value Date   CHOL 152 06/25/2011   HDL 28* 06/25/2011   LDLCALC 94 06/25/2011   TRIG 150* 06/25/2011   CHOLHDL 5.4 06/25/2011    F.  Lifestyle Changes:  Making positive lifestyle changes and Not smoking:  Quit 1999  G.  Symptoms noted with exercise:  Asymptomatic  Report Completed By:  Oletta Lamas. Nillie Bartolotta RN   Comments:  This is patients 1st week report. He has done well. He achieved a peak METS of 3.54. His resting HR was 90 and resting BP was 104/58, His peak HR was 105 and peak BP was 130/60. A report will follow upon the patients halfway visit # 18.

## 2013-06-22 NOTE — Addendum Note (Signed)
Encounter addended by: Norlene Duel, RN on: 06/22/2013  7:52 AM<BR>     Documentation filed: Clinical Notes

## 2013-07-01 ENCOUNTER — Other Ambulatory Visit: Payer: Self-pay

## 2013-07-01 ENCOUNTER — Other Ambulatory Visit (INDEPENDENT_AMBULATORY_CARE_PROVIDER_SITE_OTHER): Payer: Medicare Other

## 2013-07-01 ENCOUNTER — Telehealth: Payer: Self-pay | Admitting: *Deleted

## 2013-07-01 DIAGNOSIS — I059 Rheumatic mitral valve disease, unspecified: Secondary | ICD-10-CM | POA: Diagnosis not present

## 2013-07-01 DIAGNOSIS — I2589 Other forms of chronic ischemic heart disease: Secondary | ICD-10-CM

## 2013-07-01 DIAGNOSIS — I2510 Atherosclerotic heart disease of native coronary artery without angina pectoris: Secondary | ICD-10-CM

## 2013-07-01 DIAGNOSIS — I255 Ischemic cardiomyopathy: Secondary | ICD-10-CM

## 2013-07-01 NOTE — Telephone Encounter (Signed)
Message copied by Merlene Laughter on Thu Jul 01, 2013  3:46 PM ------      Message from: MCDOWELL, Aloha Gell      Created: Thu Jul 01, 2013  3:34 PM       Reviewed. LVEF has improved compared to prior assessment, now 45-50%. Nice result - would continue medical therapy. No indication for ICD at this point. ------

## 2013-07-01 NOTE — Telephone Encounter (Signed)
Patient informed. 

## 2013-07-15 ENCOUNTER — Ambulatory Visit (INDEPENDENT_AMBULATORY_CARE_PROVIDER_SITE_OTHER): Payer: Medicare Other | Admitting: Cardiology

## 2013-07-15 ENCOUNTER — Encounter: Payer: Self-pay | Admitting: Cardiology

## 2013-07-15 VITALS — BP 95/52 | HR 78 | Ht 65.0 in | Wt 165.1 lb

## 2013-07-15 DIAGNOSIS — I2510 Atherosclerotic heart disease of native coronary artery without angina pectoris: Secondary | ICD-10-CM | POA: Diagnosis not present

## 2013-07-15 DIAGNOSIS — I10 Essential (primary) hypertension: Secondary | ICD-10-CM | POA: Diagnosis not present

## 2013-07-15 DIAGNOSIS — I2589 Other forms of chronic ischemic heart disease: Secondary | ICD-10-CM

## 2013-07-15 DIAGNOSIS — I739 Peripheral vascular disease, unspecified: Secondary | ICD-10-CM

## 2013-07-15 DIAGNOSIS — I255 Ischemic cardiomyopathy: Secondary | ICD-10-CM

## 2013-07-15 NOTE — Assessment & Plan Note (Signed)
For now continue current regimen.

## 2013-07-15 NOTE — Progress Notes (Signed)
Clinical Summary Hunter Townsend is a 73 y.o.male last seen in November 2014. He has completed cardiac rehabilitation. States that he has generally done very well. Has felt somewhat dizzy recently. States that he exercises every day and sometimes "overdoes it." He is not reporting any angina. No fevers or chills. May not be hydrating adequately in retrospect. Blood pressure low normal today.  Followup echocardiogram performed recently showed improvement in LVEF to the range of 22-02%, grade 1 diastolic dysfunction, wall motion abnormalities consistent with ischemic heart disease, trivial aortic regurgitation, mild mitral regurgitation, severe left atrial enlargement.  He was recently seen in January by Dr. Burt Knack for followup PAD. He has an 80% left iliac stenosis although is symptomatically stable and ABIs have been reassuring. He is being managed medically. Previously placed left SFA stent patent.   No Known Allergies  Current Outpatient Prescriptions  Medication Sig Dispense Refill  . aspirin EC 81 MG tablet Take 81 mg by mouth daily.      . carvedilol (COREG) 12.5 MG tablet Take 1 tablet (12.5 mg total) by mouth 2 (two) times daily with a meal.  60 tablet  6  . clopidogrel (PLAVIX) 75 MG tablet Take 1 tablet (75 mg total) by mouth daily with breakfast.  30 tablet  12  . gabapentin (NEURONTIN) 100 MG capsule Take 100 mg by mouth 2 (two) times daily.       . isosorbide mononitrate (IMDUR) 30 MG 24 hr tablet Take 30 mg by mouth 2 (two) times daily.       Marland Kitchen LORazepam (ATIVAN) 1 MG tablet Take 1 mg by mouth 2 (two) times daily as needed for anxiety (sleep).       . losartan (COZAAR) 50 MG tablet Take 50 mg by mouth daily.      . Multiple Vitamin (MULTIVITAMIN) tablet Take 1 tablet by mouth daily.      . nitroGLYCERIN (NITROSTAT) 0.4 MG SL tablet Place 1 tablet (0.4 mg total) under the tongue every 5 (five) minutes as needed. For chest pain  25 tablet  3  . Omega-3 Fatty Acids (FISH OIL) 1000 MG  CAPS Take 2,000 mg by mouth daily.      . pantoprazole (PROTONIX) 40 MG tablet Take 40 mg by mouth daily.       Marland Kitchen triamcinolone cream (KENALOG) 0.1 % Apply 1 application topically 2 (two) times daily.       No current facility-administered medications for this visit.    Past Medical History  Diagnosis Date  . BPH (benign prostatic hyperplasia)   . Coronary atherosclerosis of native coronary artery     Multivessel, NSTEMI  s/p PTCA/DES to mid LAD 06/24/11,, known patent LIMA-LAD and occlusion of all vein grafts  . Hypertension   . Arthritis   . Ischemic cardiomyopathy     EF 35-40% by cath 06/24/11 (EF 37% by nuclear stress test 06/2010).  Marland Kitchen PAD (peripheral artery disease)     Status post left SFA stent - Dr. Burt Knack    Past Surgical History  Procedure Laterality Date  . Coronary artery bypass graft  [place], [place] - LIMA to LAD, SVG to RCA presumably  . Back surgery      Social History Hunter Townsend reports that he quit smoking about 20 years ago. His smoking use included Cigarettes. He has a 80 pack-year smoking history. He has never used smokeless tobacco. Hunter Townsend reports that he does not drink alcohol.  Review of Systems  No claudication, no palpitations or syncope. Otherwise as outlined.  Physical Examination Filed Vitals:   07/15/13 0846  BP: 95/52  Pulse: 78   Filed Weights   07/15/13 0846  Weight: 165 lb 1.9 oz (74.898 kg)    Normally nourished appearing male, comfortable at rest.  HEENT: Conjunctiva and lids normal, oropharynx with moist mucosa.  Neck: Supple, no elevated JVP or bruits. No thyromegaly.  Lungs: Clear auscultation, nonlabored.  Cardiac: Regular rate and rhythm, soft systolic murmur, split S2, no gallop.  Abdomen: Soft, nontender, bowel sounds present.  Skin: Warm and dry.  Extremities: No pitting edema. Distal pulses 1+.  Musculoskeletal: No kyphosis.  Neuropsychiatric: Alert and oriented x3, affect appropriate.    Problem List and  Plan   Coronary atherosclerosis of native coronary artery Continue medical therapy and exercise. I recommended that he work in some low intensity days to his regimen. Consider taking one day off a week. Also discussed adequate hydration. I asked him to keep an eye on blood pressure and if he remains in the low range and may need to cut back Cozaar. Otherwise keep office visit in 3 months.  Ischemic cardiomyopathy Recent echocardiogram shows improvement in LVEF to the range of 45-50%.  Essential hypertension, benign For now continue current regimen.  Peripheral arterial disease Recent followup with Dr. Burt Knack noted.    Satira Sark, M.D., F.A.C.C.

## 2013-07-15 NOTE — Assessment & Plan Note (Signed)
Recent followup with Dr. Burt Knack noted.

## 2013-07-15 NOTE — Patient Instructions (Signed)
Your physician recommends that you schedule a follow-up appointment in: 3 months. Your physician recommends that you continue on your current medications as directed. Please refer to the Current Medication list given to you today. Your physician has requested that you regularly monitor and record your blood pressure readings at home. Please use the same machine at the same time of day to check your readings and record them.

## 2013-07-15 NOTE — Assessment & Plan Note (Signed)
Continue medical therapy and exercise. I recommended that he work in some low intensity days to his regimen. Consider taking one day off a week. Also discussed adequate hydration. I asked him to keep an eye on blood pressure and if he remains in the low range and may need to cut back Cozaar. Otherwise keep office visit in 3 months.

## 2013-07-15 NOTE — Assessment & Plan Note (Signed)
Recent echocardiogram shows improvement in LVEF to the range of 45-50%.

## 2013-07-26 DIAGNOSIS — J069 Acute upper respiratory infection, unspecified: Secondary | ICD-10-CM | POA: Diagnosis not present

## 2013-07-26 DIAGNOSIS — H8110 Benign paroxysmal vertigo, unspecified ear: Secondary | ICD-10-CM | POA: Diagnosis not present

## 2013-08-11 DIAGNOSIS — M25579 Pain in unspecified ankle and joints of unspecified foot: Secondary | ICD-10-CM | POA: Diagnosis not present

## 2013-08-11 DIAGNOSIS — G5790 Unspecified mononeuropathy of unspecified lower limb: Secondary | ICD-10-CM | POA: Diagnosis not present

## 2013-08-11 DIAGNOSIS — G608 Other hereditary and idiopathic neuropathies: Secondary | ICD-10-CM | POA: Diagnosis not present

## 2013-08-11 DIAGNOSIS — M79609 Pain in unspecified limb: Secondary | ICD-10-CM | POA: Diagnosis not present

## 2013-08-26 ENCOUNTER — Other Ambulatory Visit: Payer: Self-pay | Admitting: Cardiology

## 2013-09-23 DIAGNOSIS — H02059 Trichiasis without entropian unspecified eye, unspecified eyelid: Secondary | ICD-10-CM | POA: Diagnosis not present

## 2013-09-23 DIAGNOSIS — Z961 Presence of intraocular lens: Secondary | ICD-10-CM | POA: Diagnosis not present

## 2013-09-23 DIAGNOSIS — H35349 Macular cyst, hole, or pseudohole, unspecified eye: Secondary | ICD-10-CM | POA: Diagnosis not present

## 2013-09-23 DIAGNOSIS — H35379 Puckering of macula, unspecified eye: Secondary | ICD-10-CM | POA: Diagnosis not present

## 2013-10-01 ENCOUNTER — Ambulatory Visit (INDEPENDENT_AMBULATORY_CARE_PROVIDER_SITE_OTHER): Payer: Medicare Other | Admitting: Cardiovascular Disease

## 2013-10-01 ENCOUNTER — Encounter: Payer: Self-pay | Admitting: Cardiovascular Disease

## 2013-10-01 VITALS — BP 132/70 | HR 65 | Ht 65.0 in | Wt 169.4 lb

## 2013-10-01 DIAGNOSIS — I739 Peripheral vascular disease, unspecified: Secondary | ICD-10-CM

## 2013-10-01 DIAGNOSIS — I2510 Atherosclerotic heart disease of native coronary artery without angina pectoris: Secondary | ICD-10-CM | POA: Diagnosis not present

## 2013-10-01 NOTE — Patient Instructions (Signed)
Your physician wants you to follow-up in: 1 YEAR with Dr Burt Knack.  You will receive a reminder letter in the mail two months in advance. If you don't receive a letter, please call our office to schedule the follow-up appointment.  Your physician has requested that you have an ankle brachial index (ABI) in 1 YEAR. During this test an ultrasound and blood pressure cuff are used to evaluate the arteries that supply the arms and legs with blood. Allow thirty minutes for this exam. There are no restrictions or special instructions.  Your physician has requested that you have an aorto-iliac duplex in 1 YEAR.   Your physician recommends that you continue on your current medications as directed. Please refer to the Current Medication list given to you today.

## 2013-10-01 NOTE — Progress Notes (Signed)
HPI:  73 year old gentleman presenting for followup evaluation. He has coronary and peripheral arterial disease. The patient underwent PCI last year. He has a history of coronary bypass surgery. All of his vein grafts are occluded and he had a tight stenosis in the LIMA to LAD at the distal anastomosis. This was treated with a drug-eluting stent. At that time he was experiencing accelerating symptoms of angina. He has not had chest discomfort associated with exertion since the time of his PCI. He's had an echocardiogram demonstrating improvement of his LV function, now with an LVEF of 45-50%.  He does have a history of lower extremity peripheral arterial disease and he was noted to have 80% left iliac stenosis at cath. He previously had ABIs and duplex studies done in June 2014. The ABIs were normal bilaterally. The lower extremity duplex showed continued patency of the previously implanted left SFA stent.  He complains of burning in both lower legs, at rest and with activity. This is not exacerbated by ambulation or physical exertion. He's had no chest pain. Overall he is doing well today. He reports compliance with his medications and no bleeding problems related to long-term DAPT with ASA and plavix.   Outpatient Encounter Prescriptions as of 10/01/2013  Medication Sig  . aspirin EC 81 MG tablet Take 81 mg by mouth daily.  . carvedilol (COREG) 12.5 MG tablet TAKE (1) TABLET TWICE A DAY WITH MEALS.  Marland Kitchen clopidogrel (PLAVIX) 75 MG tablet Take 1 tablet (75 mg total) by mouth daily with breakfast.  . gabapentin (NEURONTIN) 100 MG capsule Take 100 mg by mouth 2 (two) times daily.   . isosorbide mononitrate (IMDUR) 30 MG 24 hr tablet Take 30 mg by mouth 2 (two) times daily.   Marland Kitchen LORazepam (ATIVAN) 1 MG tablet Take 1 mg by mouth 2 (two) times daily as needed for anxiety (sleep).   . losartan (COZAAR) 50 MG tablet Take 50 mg by mouth daily.  . meclizine (ANTIVERT) 25 MG tablet   . Multiple Vitamin  (MULTIVITAMIN) tablet Take 1 tablet by mouth daily.  . nitroGLYCERIN (NITROSTAT) 0.4 MG SL tablet Place 1 tablet (0.4 mg total) under the tongue every 5 (five) minutes as needed. For chest pain  . Omega-3 Fatty Acids (FISH OIL) 1000 MG CAPS Take 2,000 mg by mouth daily.  . pantoprazole (PROTONIX) 40 MG tablet Take 40 mg by mouth daily.   . [DISCONTINUED] triamcinolone cream (KENALOG) 0.1 % Apply 1 application topically 2 (two) times daily.    No Known Allergies  Past Medical History  Diagnosis Date  . BPH (benign prostatic hyperplasia)   . Coronary atherosclerosis of native coronary artery     Multivessel, NSTEMI  s/p PTCA/DES to mid LAD 06/24/11,, known patent LIMA-LAD and occlusion of all vein grafts  . Hypertension   . Arthritis   . Ischemic cardiomyopathy     EF 35-40% by cath 06/24/11 (EF 37% by nuclear stress test 06/2010).  Marland Kitchen PAD (peripheral artery disease)     Status post left SFA stent - Dr. Burt Knack    ROS: Negative except as per HPI  BP 132/70  Pulse 65  Ht 5\' 5"  (1.651 m)  Wt 169 lb 6.4 oz (76.839 kg)  BMI 28.19 kg/m2  PHYSICAL EXAM: Pt is alert and oriented, NAD HEENT: normal Neck: JVP - normal Lungs: CTA bilaterally CV: RRR without murmur or gallop Abd: soft, NT, Positive BS, no hepatomegaly Ext: no C/C/E, distal pulses intact and equal Skin: warm/dry no  rash  EKG:  NSR, within normal limits  ASSESSMENT AND PLAN: 1. Lower extremity PAD - stable without claudication symptoms. Current symptoms seem consistent with neuropathy or neurogenic pain. He has normal pedal pulses on exam. Will see him back in one year.  2. CAD s/p CABG and PCI of the LIMA-LAD insertion site. Favor long-term DAPT as tolerated. Followed by Dr Domenic Polite.  Will see back in one year with ABI's same day as his office visit.  Sherren Mocha 10/01/2013 12:05 PM

## 2013-10-12 ENCOUNTER — Ambulatory Visit: Payer: Medicare Other | Admitting: Cardiology

## 2013-10-26 ENCOUNTER — Encounter: Payer: Self-pay | Admitting: Cardiology

## 2013-10-26 ENCOUNTER — Ambulatory Visit (INDEPENDENT_AMBULATORY_CARE_PROVIDER_SITE_OTHER): Payer: Medicare Other | Admitting: Cardiology

## 2013-10-26 VITALS — BP 118/72 | HR 59 | Ht 65.0 in | Wt 171.8 lb

## 2013-10-26 DIAGNOSIS — I2589 Other forms of chronic ischemic heart disease: Secondary | ICD-10-CM | POA: Diagnosis not present

## 2013-10-26 DIAGNOSIS — I10 Essential (primary) hypertension: Secondary | ICD-10-CM

## 2013-10-26 DIAGNOSIS — I255 Ischemic cardiomyopathy: Secondary | ICD-10-CM

## 2013-10-26 DIAGNOSIS — I2510 Atherosclerotic heart disease of native coronary artery without angina pectoris: Secondary | ICD-10-CM

## 2013-10-26 DIAGNOSIS — I739 Peripheral vascular disease, unspecified: Secondary | ICD-10-CM | POA: Diagnosis not present

## 2013-10-26 NOTE — Assessment & Plan Note (Signed)
Keep followup with Dr. Burt Knack.

## 2013-10-26 NOTE — Progress Notes (Signed)
Clinical Summary Mr. Zakarian is a 73 y.o.male last seen in February of this year. Recent interval visit noted with Dr. Burt Knack. He continues to do fairly well, no angina symptoms, stable mild dyspnea on exertion. He is troubled by lower back pain at times when he walks, still trying to stay active. He walked 2 miles on a local trail the other day, also goes to the [facility].  Followup echocardiogram from February 2015 showed improvement in LVEF to the range of 41-66%, grade 1 diastolic dysfunction, wall motion abnormalities consistent with ischemic heart disease, trivial aortic regurgitation, mild mitral regurgitation, severe left atrial enlargement.  No recent lipid panel. He has had prior statin intolerance. Currently on omega-3 supplements.  No Known Allergies  Current Outpatient Prescriptions  Medication Sig Dispense Refill  . aspirin EC 81 MG tablet Take 81 mg by mouth daily.      . carvedilol (COREG) 12.5 MG tablet TAKE (1) TABLET TWICE A DAY WITH MEALS.  60 tablet  6  . clopidogrel (PLAVIX) 75 MG tablet Take 1 tablet (75 mg total) by mouth daily with breakfast.  30 tablet  12  . gabapentin (NEURONTIN) 100 MG capsule Take 100 mg by mouth 2 (two) times daily.       . isosorbide mononitrate (IMDUR) 30 MG 24 hr tablet Take 30 mg by mouth 2 (two) times daily.       Marland Kitchen LORazepam (ATIVAN) 1 MG tablet Take 1 mg by mouth 2 (two) times daily as needed for anxiety (sleep).       . losartan (COZAAR) 50 MG tablet Take 50 mg by mouth daily.      . Multiple Vitamin (MULTIVITAMIN) tablet Take 1 tablet by mouth daily.      . nitroGLYCERIN (NITROSTAT) 0.4 MG SL tablet Place 1 tablet (0.4 mg total) under the tongue every 5 (five) minutes as needed. For chest pain  25 tablet  3  . Omega-3 Fatty Acids (FISH OIL) 1000 MG CAPS Take 2,000 mg by mouth daily.      . pantoprazole (PROTONIX) 40 MG tablet Take 40 mg by mouth daily.        No current facility-administered medications for this visit.    Past  Medical History  Diagnosis Date  . BPH (benign prostatic hyperplasia)   . Coronary atherosclerosis of native coronary artery     Multivessel, NSTEMI  s/p PTCA/DES to mid LAD 06/24/11,, known patent LIMA-LAD and occlusion of all vein grafts  . Hypertension   . Arthritis   . Ischemic cardiomyopathy     EF 35-40% by cath 06/24/11 (EF 37% by nuclear stress test 06/2010).  Marland Kitchen PAD (peripheral artery disease)     Status post left SFA stent - Dr. Burt Knack    Past Surgical History  Procedure Laterality Date  . Coronary artery bypass graft  [place], [place] - LIMA to LAD, SVG to RCA presumably  . Back surgery      Social History Mr. Pedrosa reports that he quit smoking about 20 years ago. His smoking use included Cigarettes. He has a 80 pack-year smoking history. He has never used smokeless tobacco. Mr. Spruce reports that he does not drink alcohol.  Review of Systems Negative except as outlined.  Physical Examination Filed Vitals:   10/26/13 1341  BP: 118/72  Pulse: 59   Filed Weights   10/26/13 1341  Weight: 171 lb 12.8 oz (77.928 kg)    Normally nourished appearing male, comfortable at rest.  HEENT: Conjunctiva and lids normal, oropharynx with moist mucosa.  Neck: Supple, no elevated JVP or bruits. No thyromegaly.  Lungs: Clear auscultation, nonlabored.  Cardiac: Regular rate and rhythm, soft systolic murmur, split S2, no gallop.  Abdomen: Soft, nontender, bowel sounds present.  Skin: Warm and dry.  Extremities: No pitting edema. Distal pulses 1+.  Musculoskeletal: No kyphosis.  Neuropsychiatric: Alert and oriented x3, affect appropriate.    Problem List and Plan   Coronary atherosclerosis of native coronary artery Symptomatically stable on medical therapy. No changes made to regimen today. Continue regular exercise plan, followup in 6 months.  Ischemic cardiomyopathy Recent echocardiogram shows improvement in LVEF to the range of 45-50%.  Essential hypertension,  benign Blood pressure well controlled today.  Peripheral arterial disease Keep followup with Dr. Burt Knack.    Satira Sark, M.D., F.A.C.C.

## 2013-10-26 NOTE — Assessment & Plan Note (Signed)
Blood pressure well-controlled today. 

## 2013-10-26 NOTE — Assessment & Plan Note (Signed)
Symptomatically stable on medical therapy. No changes made to regimen today. Continue regular exercise plan, followup in 6 months.

## 2013-10-26 NOTE — Assessment & Plan Note (Signed)
Recent echocardiogram shows improvement in LVEF to the range of 45-50%.

## 2013-10-26 NOTE — Patient Instructions (Signed)
Your physician recommends that you schedule a follow-up appointment in: 6 months. You will receive a reminder letter in the mail in about 4 months reminding you to call and schedule your appointment. If you don't receive this letter, please contact our office. Your physician recommends that you continue on your current medications as directed. Please refer to the Current Medication list given to you today. Your physician recommends that you have a FASTING lipid profile done. Please don't eat or drink after midnight the morning you plan to go.

## 2013-10-27 DIAGNOSIS — I2510 Atherosclerotic heart disease of native coronary artery without angina pectoris: Secondary | ICD-10-CM | POA: Diagnosis not present

## 2013-11-03 ENCOUNTER — Telehealth: Payer: Self-pay | Admitting: *Deleted

## 2013-11-03 NOTE — Telephone Encounter (Signed)
Message copied by Merlene Laughter on Wed Nov 03, 2013 12:06 PM ------      Message from: Satira Sark      Created: Fri Oct 29, 2013  7:52 AM       Reviewed. Total cholesterol 152, LDL 101. Continue current management and exercise. ------

## 2013-11-03 NOTE — Telephone Encounter (Signed)
Patient informed. 

## 2013-11-10 DIAGNOSIS — M25579 Pain in unspecified ankle and joints of unspecified foot: Secondary | ICD-10-CM | POA: Diagnosis not present

## 2013-11-10 DIAGNOSIS — M79609 Pain in unspecified limb: Secondary | ICD-10-CM | POA: Diagnosis not present

## 2013-12-15 DIAGNOSIS — M129 Arthropathy, unspecified: Secondary | ICD-10-CM | POA: Diagnosis not present

## 2013-12-15 DIAGNOSIS — G619 Inflammatory polyneuropathy, unspecified: Secondary | ICD-10-CM | POA: Diagnosis not present

## 2013-12-15 DIAGNOSIS — G622 Polyneuropathy due to other toxic agents: Secondary | ICD-10-CM | POA: Diagnosis not present

## 2013-12-15 DIAGNOSIS — I739 Peripheral vascular disease, unspecified: Secondary | ICD-10-CM | POA: Diagnosis not present

## 2013-12-15 DIAGNOSIS — I10 Essential (primary) hypertension: Secondary | ICD-10-CM | POA: Diagnosis not present

## 2013-12-15 DIAGNOSIS — K219 Gastro-esophageal reflux disease without esophagitis: Secondary | ICD-10-CM | POA: Diagnosis not present

## 2013-12-22 DIAGNOSIS — I739 Peripheral vascular disease, unspecified: Secondary | ICD-10-CM | POA: Diagnosis not present

## 2013-12-22 DIAGNOSIS — I10 Essential (primary) hypertension: Secondary | ICD-10-CM | POA: Diagnosis not present

## 2013-12-22 DIAGNOSIS — I209 Angina pectoris, unspecified: Secondary | ICD-10-CM | POA: Diagnosis not present

## 2014-02-07 DIAGNOSIS — G608 Other hereditary and idiopathic neuropathies: Secondary | ICD-10-CM | POA: Diagnosis not present

## 2014-02-07 DIAGNOSIS — M79609 Pain in unspecified limb: Secondary | ICD-10-CM | POA: Diagnosis not present

## 2014-02-07 DIAGNOSIS — G5790 Unspecified mononeuropathy of unspecified lower limb: Secondary | ICD-10-CM | POA: Diagnosis not present

## 2014-02-07 DIAGNOSIS — M25579 Pain in unspecified ankle and joints of unspecified foot: Secondary | ICD-10-CM | POA: Diagnosis not present

## 2014-02-17 DIAGNOSIS — Z23 Encounter for immunization: Secondary | ICD-10-CM | POA: Diagnosis not present

## 2014-02-17 DIAGNOSIS — G622 Polyneuropathy due to other toxic agents: Secondary | ICD-10-CM | POA: Diagnosis not present

## 2014-02-17 DIAGNOSIS — M129 Arthropathy, unspecified: Secondary | ICD-10-CM | POA: Diagnosis not present

## 2014-02-17 DIAGNOSIS — G619 Inflammatory polyneuropathy, unspecified: Secondary | ICD-10-CM | POA: Diagnosis not present

## 2014-02-23 DIAGNOSIS — M5030 Other cervical disc degeneration, unspecified cervical region: Secondary | ICD-10-CM | POA: Diagnosis not present

## 2014-02-23 DIAGNOSIS — M4802 Spinal stenosis, cervical region: Secondary | ICD-10-CM | POA: Diagnosis not present

## 2014-02-23 DIAGNOSIS — M47812 Spondylosis without myelopathy or radiculopathy, cervical region: Secondary | ICD-10-CM | POA: Diagnosis not present

## 2014-03-11 ENCOUNTER — Other Ambulatory Visit: Payer: Self-pay

## 2014-03-12 ENCOUNTER — Other Ambulatory Visit: Payer: Self-pay | Admitting: Cardiology

## 2014-03-24 DIAGNOSIS — Z961 Presence of intraocular lens: Secondary | ICD-10-CM | POA: Diagnosis not present

## 2014-03-24 DIAGNOSIS — H3532 Exudative age-related macular degeneration: Secondary | ICD-10-CM | POA: Diagnosis not present

## 2014-03-24 DIAGNOSIS — H3530 Unspecified macular degeneration: Secondary | ICD-10-CM | POA: Diagnosis not present

## 2014-03-25 ENCOUNTER — Other Ambulatory Visit: Payer: Self-pay | Admitting: Cardiology

## 2014-04-29 DIAGNOSIS — M47812 Spondylosis without myelopathy or radiculopathy, cervical region: Secondary | ICD-10-CM | POA: Diagnosis not present

## 2014-04-29 DIAGNOSIS — M47816 Spondylosis without myelopathy or radiculopathy, lumbar region: Secondary | ICD-10-CM | POA: Diagnosis not present

## 2014-05-05 ENCOUNTER — Encounter (HOSPITAL_COMMUNITY): Payer: Self-pay | Admitting: Cardiovascular Disease

## 2014-05-11 DIAGNOSIS — Z9889 Other specified postprocedural states: Secondary | ICD-10-CM | POA: Diagnosis not present

## 2014-05-11 DIAGNOSIS — M4806 Spinal stenosis, lumbar region: Secondary | ICD-10-CM | POA: Diagnosis not present

## 2014-05-11 DIAGNOSIS — M4186 Other forms of scoliosis, lumbar region: Secondary | ICD-10-CM | POA: Diagnosis not present

## 2014-05-11 DIAGNOSIS — M5136 Other intervertebral disc degeneration, lumbar region: Secondary | ICD-10-CM | POA: Diagnosis not present

## 2014-05-11 DIAGNOSIS — M5137 Other intervertebral disc degeneration, lumbosacral region: Secondary | ICD-10-CM | POA: Diagnosis not present

## 2014-05-11 DIAGNOSIS — M47816 Spondylosis without myelopathy or radiculopathy, lumbar region: Secondary | ICD-10-CM | POA: Diagnosis not present

## 2014-05-28 DIAGNOSIS — J019 Acute sinusitis, unspecified: Secondary | ICD-10-CM | POA: Diagnosis not present

## 2014-05-28 DIAGNOSIS — J209 Acute bronchitis, unspecified: Secondary | ICD-10-CM | POA: Diagnosis not present

## 2014-06-02 DIAGNOSIS — M47812 Spondylosis without myelopathy or radiculopathy, cervical region: Secondary | ICD-10-CM | POA: Diagnosis not present

## 2014-06-02 DIAGNOSIS — M47816 Spondylosis without myelopathy or radiculopathy, lumbar region: Secondary | ICD-10-CM | POA: Diagnosis not present

## 2014-06-10 ENCOUNTER — Other Ambulatory Visit: Payer: Self-pay | Admitting: Cardiology

## 2014-06-15 DIAGNOSIS — M4800 Spinal stenosis, site unspecified: Secondary | ICD-10-CM | POA: Diagnosis not present

## 2014-06-15 DIAGNOSIS — I10 Essential (primary) hypertension: Secondary | ICD-10-CM | POA: Diagnosis not present

## 2014-06-15 DIAGNOSIS — M1990 Unspecified osteoarthritis, unspecified site: Secondary | ICD-10-CM | POA: Diagnosis not present

## 2014-06-15 DIAGNOSIS — Z1389 Encounter for screening for other disorder: Secondary | ICD-10-CM | POA: Diagnosis not present

## 2014-07-07 DIAGNOSIS — M5136 Other intervertebral disc degeneration, lumbar region: Secondary | ICD-10-CM | POA: Diagnosis not present

## 2014-07-07 DIAGNOSIS — M4806 Spinal stenosis, lumbar region: Secondary | ICD-10-CM | POA: Diagnosis not present

## 2014-07-07 DIAGNOSIS — M47816 Spondylosis without myelopathy or radiculopathy, lumbar region: Secondary | ICD-10-CM | POA: Diagnosis not present

## 2014-07-18 ENCOUNTER — Telehealth: Payer: Self-pay | Admitting: Cardiology

## 2014-07-18 NOTE — Telephone Encounter (Signed)
Will forward to provider  

## 2014-07-18 NOTE — Telephone Encounter (Signed)
Pt will call Dr. Isabelle Course office to clarify. He didn't know we had already sent form. Thanked [place] for informing him.

## 2014-07-18 NOTE — Telephone Encounter (Signed)
A form was sent to our office by Dr. Francesco Runner requesting time off Plavix prior to injection. I believe that the form indicated 5-7 days. Patient needs to contact Dr. Isabelle Course office to find out what specifically they requested.

## 2014-07-18 NOTE — Telephone Encounter (Signed)
[name] needs to know how many days to stay off Plavix. He is scheduled on 07-26-14 for injections in his back.  He is scheduled with Dr. Francesco Runner .

## 2014-07-26 DIAGNOSIS — G8929 Other chronic pain: Secondary | ICD-10-CM | POA: Diagnosis not present

## 2014-07-26 DIAGNOSIS — I252 Old myocardial infarction: Secondary | ICD-10-CM | POA: Diagnosis not present

## 2014-07-26 DIAGNOSIS — M47816 Spondylosis without myelopathy or radiculopathy, lumbar region: Secondary | ICD-10-CM | POA: Diagnosis not present

## 2014-07-26 DIAGNOSIS — M5136 Other intervertebral disc degeneration, lumbar region: Secondary | ICD-10-CM | POA: Diagnosis not present

## 2014-07-26 DIAGNOSIS — Z955 Presence of coronary angioplasty implant and graft: Secondary | ICD-10-CM | POA: Diagnosis not present

## 2014-07-26 DIAGNOSIS — Z79899 Other long term (current) drug therapy: Secondary | ICD-10-CM | POA: Diagnosis not present

## 2014-07-26 DIAGNOSIS — I2510 Atherosclerotic heart disease of native coronary artery without angina pectoris: Secondary | ICD-10-CM | POA: Diagnosis not present

## 2014-07-26 DIAGNOSIS — Z7902 Long term (current) use of antithrombotics/antiplatelets: Secondary | ICD-10-CM | POA: Diagnosis not present

## 2014-07-26 DIAGNOSIS — I10 Essential (primary) hypertension: Secondary | ICD-10-CM | POA: Diagnosis not present

## 2014-07-26 DIAGNOSIS — M4806 Spinal stenosis, lumbar region: Secondary | ICD-10-CM | POA: Diagnosis not present

## 2014-08-05 ENCOUNTER — Encounter: Payer: Self-pay | Admitting: *Deleted

## 2014-08-09 ENCOUNTER — Encounter: Payer: Self-pay | Admitting: Cardiology

## 2014-08-09 ENCOUNTER — Ambulatory Visit (INDEPENDENT_AMBULATORY_CARE_PROVIDER_SITE_OTHER): Payer: Medicare Other | Admitting: Cardiology

## 2014-08-09 VITALS — BP 118/60 | HR 75 | Ht 65.0 in | Wt 174.0 lb

## 2014-08-09 DIAGNOSIS — E782 Mixed hyperlipidemia: Secondary | ICD-10-CM | POA: Diagnosis not present

## 2014-08-09 DIAGNOSIS — I255 Ischemic cardiomyopathy: Secondary | ICD-10-CM | POA: Diagnosis not present

## 2014-08-09 DIAGNOSIS — I2510 Atherosclerotic heart disease of native coronary artery without angina pectoris: Secondary | ICD-10-CM

## 2014-08-09 DIAGNOSIS — I10 Essential (primary) hypertension: Secondary | ICD-10-CM | POA: Diagnosis not present

## 2014-08-09 NOTE — Patient Instructions (Signed)

## 2014-08-09 NOTE — Progress Notes (Signed)
Cardiology Office Note  Date: 08/09/2014   ID: Hunter Townsend, DOB 08-29-1940, MRN [id number]  PCP: Rory Percy, MD  Primary Cardiologist: Rozann Lesches, MD   Chief Complaint  Patient presents with  . Coronary Artery Disease  . Cardiomyopathy  . PAD    History of Present Illness: Hunter Townsend is a 74 y.o. male last seen in June 2015. He presents for a routine follow-up visit. Reports no angina symptoms or nitroglycerin use with his typical ADLs. He was temporarily off Plavix for a spinal injection per Dr. Francesco Runner. States that his back pain has improved. No progressive claudication symptoms either. He still tries to walk for exercise.  We reviewed his medications, there have been no significant changes in regimen. He continues to follow with Dr. Nadara Mustard. Last lipid panel showed good LDL control overall.  He reports no orthopnea or PND, last assessment of LVEF was up in the range of 45-50%. He has gained some weight over the winter, but does not appear to be related to worsening volume status.   Past Medical History  Diagnosis Date  . BPH (benign prostatic hyperplasia)   . Coronary atherosclerosis of native coronary artery     Multivessel, NSTEMI  s/p PTCA/DES to mid LAD 06/24/11,, known patent LIMA-LAD and occlusion of all vein grafts  . Hypertension   . Arthritis   . Ischemic cardiomyopathy     EF 35-40% by cath 06/24/11 (EF 37% by nuclear stress test 06/2010).  Marland Kitchen PAD (peripheral artery disease)     Status post left SFA stent - Dr. Burt Knack    Past Surgical History  Procedure Laterality Date  . Coronary artery bypass graft  [place], [place] - LIMA to LAD, SVG to RCA presumably  . Back surgery    . Left heart catheterization with coronary/graft angiogram  06/24/2011    Procedure: LEFT HEART CATHETERIZATION WITH Beatrix Fetters;  Surgeon: Burnell Blanks, MD;  Location: [facility] CATH LAB;  Service: Cardiovascular;;  . Lower extremity angiogram N/A  10/30/2011    Procedure: LOWER EXTREMITY ANGIOGRAM;  Surgeon: Sherren Mocha, MD;  Location: [facility] CATH LAB;  Service: Cardiovascular;  Laterality: N/A;  . Left heart catheterization with coronary/graft angiogram N/A 02/26/2013    Procedure: LEFT HEART CATHETERIZATION WITH Beatrix Fetters;  Surgeon: Blane Ohara, MD;  Location: [facility] CATH LAB;  Service: Cardiovascular;  Laterality: N/A;  . Percutaneous coronary stent intervention (pci-s)  02/26/2013    Procedure: PERCUTANEOUS CORONARY STENT INTERVENTION (PCI-S);  Surgeon: Blane Ohara, MD;  Location: [facility] CATH LAB;  Service: Cardiovascular;;    Current Outpatient Prescriptions  Medication Sig Dispense Refill  . aspirin EC 81 MG tablet Take 81 mg by mouth daily.    . carvedilol (COREG) 12.5 MG tablet TAKE (1) TABLET TWICE A DAY WITH MEALS. 60 tablet 6  . clopidogrel (PLAVIX) 75 MG tablet TAKE (1) TABLET ONCE DAILY WITH BREAKFAST. 30 tablet 6  . gabapentin (NEURONTIN) 100 MG capsule Take 100 mg by mouth 3 (three) times daily.     . isosorbide mononitrate (IMDUR) 30 MG 24 hr tablet Take 30 mg by mouth 2 (two) times daily.     Marland Kitchen LORazepam (ATIVAN) 1 MG tablet Take 1 mg by mouth 2 (two) times daily as needed for anxiety (sleep).     . losartan (COZAAR) 50 MG tablet Take 50 mg by mouth daily.    . Multiple Vitamin (MULTIVITAMIN) tablet Take 1 tablet by mouth daily.    Marland Kitchen  nitroGLYCERIN (NITROSTAT) 0.4 MG SL tablet Place 1 tablet (0.4 mg total) under the tongue every 5 (five) minutes as needed. For chest pain 25 tablet 3  . Omega-3 Fatty Acids (FISH OIL) 1000 MG CAPS Take 2,000 mg by mouth daily.    . pantoprazole (PROTONIX) 40 MG tablet Take 40 mg by mouth daily.      No current facility-administered medications for this visit.    Allergies:  Review of patient's allergies indicates no known allergies.   Social History: The patient  reports that he quit smoking about 21 years ago. His smoking use included Cigarettes. He has a 80 pack-year  smoking history. He has never used smokeless tobacco. He reports that he does not drink alcohol or use illicit drugs.    ROS:  Please see the history of present illness. Otherwise, complete review of systems is positive for none.  All other systems are reviewed and negative.    Physical Exam: VS:  BP 118/60 mmHg  Pulse 75  Ht 5\' 5"  (1.651 m)  Wt 174 lb (78.926 kg)  BMI 28.96 kg/m2  SpO2 97%, BMI Body mass index is 28.96 kg/(m^2).  Wt Readings from Last 3 Encounters:  08/09/14 174 lb (78.926 kg)  10/26/13 171 lb 12.8 oz (77.928 kg)  10/01/13 169 lb 6.4 oz (76.839 kg)     Normally nourished appearing male, comfortable at rest.  HEENT: Conjunctiva and lids normal, oropharynx with moist mucosa.  Neck: Supple, no elevated JVP or bruits. No thyromegaly.  Lungs: Clear auscultation, nonlabored.  Cardiac: Regular rate and rhythm, soft systolic murmur, split S2, no gallop.  Abdomen: Soft, nontender, bowel sounds present.  Skin: Warm and dry.  Extremities: No pitting edema. Distal pulses 1+.  Musculoskeletal: No kyphosis.  Neuropsychiatric: Alert and oriented x3, affect appropriate.    ECG: ECG is not ordered today.   Recent Labwork:  Lab work from July 2015 showed cholesterol 136, triglycerides 121, HDL 24, LDL 88, hemoglobin 13.4, platelets 286, BUN 9, creatinine 0.9, potassium 4.0, AST 15, ALT 10.  Other Studies Reviewed Today:  Echocardiogram from February 2015 showed improvement in LVEF to the range of 76-16%, grade 1 diastolic dysfunction, wall motion abnormalities consistent with ischemic heart disease, trivial aortic regurgitation, mild mitral regurgitation, severe left atrial enlargement.  Assessment and Plan:  1. Multivessel CAD status post CABG with graft disease and DES to the mid LAD as detailed above. He is clinically stable and we will continue medical therapy and observation for now.  2. Hyperlipidemia, continues on omega-3 supplements, LDL looked good on  last assessment. He has a history of statin intolerance.  3. Hypertension, blood pressure well controlled.  4. Ischemic cardiomyopathy, LVEF up to the range of 45-50% by last assessment.  Current medicines are reviewed at length with the patient today.  The patient does not have concerns regarding medicines.  Disposition: FU with me in 6 months.   Signed, Satira Sark, MD, [facility] 08/09/2014 10:25 AM    [place] [facility] [place], [place], [place] [address] Phone: [phone]; Fax: [phone]

## 2014-08-15 DIAGNOSIS — G600 Hereditary motor and sensory neuropathy: Secondary | ICD-10-CM | POA: Diagnosis not present

## 2014-08-15 DIAGNOSIS — G5790 Unspecified mononeuropathy of unspecified lower limb: Secondary | ICD-10-CM | POA: Diagnosis not present

## 2014-08-15 DIAGNOSIS — M25579 Pain in unspecified ankle and joints of unspecified foot: Secondary | ICD-10-CM | POA: Diagnosis not present

## 2014-08-15 DIAGNOSIS — M79671 Pain in right foot: Secondary | ICD-10-CM | POA: Diagnosis not present

## 2014-08-24 DIAGNOSIS — M4806 Spinal stenosis, lumbar region: Secondary | ICD-10-CM | POA: Diagnosis not present

## 2014-08-24 DIAGNOSIS — M5136 Other intervertebral disc degeneration, lumbar region: Secondary | ICD-10-CM | POA: Diagnosis not present

## 2014-08-24 DIAGNOSIS — M47816 Spondylosis without myelopathy or radiculopathy, lumbar region: Secondary | ICD-10-CM | POA: Diagnosis not present

## 2014-09-06 DIAGNOSIS — M47812 Spondylosis without myelopathy or radiculopathy, cervical region: Secondary | ICD-10-CM | POA: Diagnosis not present

## 2014-09-06 DIAGNOSIS — M47816 Spondylosis without myelopathy or radiculopathy, lumbar region: Secondary | ICD-10-CM | POA: Diagnosis not present

## 2014-09-09 DIAGNOSIS — H35342 Macular cyst, hole, or pseudohole, left eye: Secondary | ICD-10-CM | POA: Diagnosis not present

## 2014-09-09 DIAGNOSIS — H35373 Puckering of macula, bilateral: Secondary | ICD-10-CM | POA: Diagnosis not present

## 2014-09-22 DIAGNOSIS — Z955 Presence of coronary angioplasty implant and graft: Secondary | ICD-10-CM | POA: Diagnosis not present

## 2014-09-22 DIAGNOSIS — Z7902 Long term (current) use of antithrombotics/antiplatelets: Secondary | ICD-10-CM | POA: Diagnosis not present

## 2014-09-22 DIAGNOSIS — Z79899 Other long term (current) drug therapy: Secondary | ICD-10-CM | POA: Diagnosis not present

## 2014-09-22 DIAGNOSIS — I252 Old myocardial infarction: Secondary | ICD-10-CM | POA: Diagnosis not present

## 2014-09-22 DIAGNOSIS — I10 Essential (primary) hypertension: Secondary | ICD-10-CM | POA: Diagnosis not present

## 2014-09-22 DIAGNOSIS — I2510 Atherosclerotic heart disease of native coronary artery without angina pectoris: Secondary | ICD-10-CM | POA: Diagnosis not present

## 2014-09-22 DIAGNOSIS — G8929 Other chronic pain: Secondary | ICD-10-CM | POA: Diagnosis not present

## 2014-09-22 DIAGNOSIS — M47816 Spondylosis without myelopathy or radiculopathy, lumbar region: Secondary | ICD-10-CM | POA: Diagnosis not present

## 2014-09-22 DIAGNOSIS — M4806 Spinal stenosis, lumbar region: Secondary | ICD-10-CM | POA: Diagnosis not present

## 2014-09-22 DIAGNOSIS — M5136 Other intervertebral disc degeneration, lumbar region: Secondary | ICD-10-CM | POA: Diagnosis not present

## 2014-10-10 DIAGNOSIS — G600 Hereditary motor and sensory neuropathy: Secondary | ICD-10-CM | POA: Diagnosis not present

## 2014-10-10 DIAGNOSIS — G5790 Unspecified mononeuropathy of unspecified lower limb: Secondary | ICD-10-CM | POA: Diagnosis not present

## 2014-10-10 DIAGNOSIS — M79671 Pain in right foot: Secondary | ICD-10-CM | POA: Diagnosis not present

## 2014-10-10 DIAGNOSIS — M25579 Pain in unspecified ankle and joints of unspecified foot: Secondary | ICD-10-CM | POA: Diagnosis not present

## 2014-10-19 DIAGNOSIS — M47816 Spondylosis without myelopathy or radiculopathy, lumbar region: Secondary | ICD-10-CM | POA: Diagnosis not present

## 2014-10-19 DIAGNOSIS — M4806 Spinal stenosis, lumbar region: Secondary | ICD-10-CM | POA: Diagnosis not present

## 2014-10-19 DIAGNOSIS — M5136 Other intervertebral disc degeneration, lumbar region: Secondary | ICD-10-CM | POA: Diagnosis not present

## 2014-11-17 DIAGNOSIS — M47816 Spondylosis without myelopathy or radiculopathy, lumbar region: Secondary | ICD-10-CM | POA: Diagnosis not present

## 2014-11-21 ENCOUNTER — Other Ambulatory Visit: Payer: Self-pay

## 2014-11-24 DIAGNOSIS — M4806 Spinal stenosis, lumbar region: Secondary | ICD-10-CM | POA: Diagnosis not present

## 2014-11-24 DIAGNOSIS — M1990 Unspecified osteoarthritis, unspecified site: Secondary | ICD-10-CM | POA: Diagnosis not present

## 2014-11-24 DIAGNOSIS — M5136 Other intervertebral disc degeneration, lumbar region: Secondary | ICD-10-CM | POA: Diagnosis not present

## 2014-11-24 DIAGNOSIS — K219 Gastro-esophageal reflux disease without esophagitis: Secondary | ICD-10-CM | POA: Diagnosis not present

## 2014-11-24 DIAGNOSIS — Z955 Presence of coronary angioplasty implant and graft: Secondary | ICD-10-CM | POA: Diagnosis not present

## 2014-11-24 DIAGNOSIS — Z7902 Long term (current) use of antithrombotics/antiplatelets: Secondary | ICD-10-CM | POA: Diagnosis not present

## 2014-11-24 DIAGNOSIS — G8929 Other chronic pain: Secondary | ICD-10-CM | POA: Diagnosis not present

## 2014-11-24 DIAGNOSIS — Z79899 Other long term (current) drug therapy: Secondary | ICD-10-CM | POA: Diagnosis not present

## 2014-11-24 DIAGNOSIS — I2510 Atherosclerotic heart disease of native coronary artery without angina pectoris: Secondary | ICD-10-CM | POA: Diagnosis not present

## 2014-11-24 DIAGNOSIS — M47816 Spondylosis without myelopathy or radiculopathy, lumbar region: Secondary | ICD-10-CM | POA: Diagnosis not present

## 2014-11-24 DIAGNOSIS — I10 Essential (primary) hypertension: Secondary | ICD-10-CM | POA: Diagnosis not present

## 2014-11-24 DIAGNOSIS — I252 Old myocardial infarction: Secondary | ICD-10-CM | POA: Diagnosis not present

## 2014-11-29 ENCOUNTER — Other Ambulatory Visit: Payer: Self-pay | Admitting: Cardiology

## 2014-11-30 DIAGNOSIS — I10 Essential (primary) hypertension: Secondary | ICD-10-CM | POA: Diagnosis not present

## 2014-11-30 DIAGNOSIS — G6289 Other specified polyneuropathies: Secondary | ICD-10-CM | POA: Diagnosis not present

## 2014-11-30 DIAGNOSIS — M1990 Unspecified osteoarthritis, unspecified site: Secondary | ICD-10-CM | POA: Diagnosis not present

## 2014-11-30 DIAGNOSIS — K219 Gastro-esophageal reflux disease without esophagitis: Secondary | ICD-10-CM | POA: Diagnosis not present

## 2014-11-30 DIAGNOSIS — I209 Angina pectoris, unspecified: Secondary | ICD-10-CM | POA: Diagnosis not present

## 2015-01-05 ENCOUNTER — Other Ambulatory Visit: Payer: Self-pay | Admitting: Cardiology

## 2015-01-09 DIAGNOSIS — M25579 Pain in unspecified ankle and joints of unspecified foot: Secondary | ICD-10-CM | POA: Diagnosis not present

## 2015-01-09 DIAGNOSIS — M79671 Pain in right foot: Secondary | ICD-10-CM | POA: Diagnosis not present

## 2015-01-09 DIAGNOSIS — M79672 Pain in left foot: Secondary | ICD-10-CM | POA: Diagnosis not present

## 2015-01-09 DIAGNOSIS — G600 Hereditary motor and sensory neuropathy: Secondary | ICD-10-CM | POA: Diagnosis not present

## 2015-01-16 DIAGNOSIS — M5136 Other intervertebral disc degeneration, lumbar region: Secondary | ICD-10-CM | POA: Diagnosis not present

## 2015-01-26 DIAGNOSIS — H35373 Puckering of macula, bilateral: Secondary | ICD-10-CM | POA: Diagnosis not present

## 2015-01-26 DIAGNOSIS — H1045 Other chronic allergic conjunctivitis: Secondary | ICD-10-CM | POA: Diagnosis not present

## 2015-01-26 DIAGNOSIS — H35342 Macular cyst, hole, or pseudohole, left eye: Secondary | ICD-10-CM | POA: Diagnosis not present

## 2015-01-26 DIAGNOSIS — Z961 Presence of intraocular lens: Secondary | ICD-10-CM | POA: Diagnosis not present

## 2015-02-22 DIAGNOSIS — M47812 Spondylosis without myelopathy or radiculopathy, cervical region: Secondary | ICD-10-CM | POA: Diagnosis not present

## 2015-02-22 DIAGNOSIS — M47816 Spondylosis without myelopathy or radiculopathy, lumbar region: Secondary | ICD-10-CM | POA: Diagnosis not present

## 2015-02-24 ENCOUNTER — Ambulatory Visit (INDEPENDENT_AMBULATORY_CARE_PROVIDER_SITE_OTHER): Payer: Medicare Other | Admitting: Cardiology

## 2015-02-24 ENCOUNTER — Encounter: Payer: Self-pay | Admitting: Cardiology

## 2015-02-24 VITALS — BP 135/66 | HR 71 | Ht 65.0 in | Wt 175.8 lb

## 2015-02-24 DIAGNOSIS — I2510 Atherosclerotic heart disease of native coronary artery without angina pectoris: Secondary | ICD-10-CM | POA: Diagnosis not present

## 2015-02-24 DIAGNOSIS — I739 Peripheral vascular disease, unspecified: Secondary | ICD-10-CM | POA: Diagnosis not present

## 2015-02-24 DIAGNOSIS — I255 Ischemic cardiomyopathy: Secondary | ICD-10-CM

## 2015-02-24 DIAGNOSIS — I10 Essential (primary) hypertension: Secondary | ICD-10-CM

## 2015-02-24 MED ORDER — CLOPIDOGREL BISULFATE 75 MG PO TABS: 75.0000 mg | ORAL_TABLET | Freq: Every day | ORAL | Status: DC

## 2015-02-24 NOTE — Patient Instructions (Signed)
Your physician recommends that you continue on your current medications as directed. Please refer to the Current Medication list given to you today. Your physician recommends that you schedule a follow-up appointment in: 6 months. You will receive a reminder letter in the mail in about 4 months reminding you to call and schedule your appointment. If you don't receive this letter, please contact our office. 

## 2015-02-24 NOTE — Progress Notes (Signed)
Cardiology Office Note  Date: 02/24/2015   ID: Hunter Townsend, DOB [birth year], MRN [id number]  PCP: Rory Percy, MD  Primary Cardiologist: Rozann Lesches, MD   Chief Complaint  Patient presents with  . Coronary Artery Disease    History of Present Illness: Hunter Townsend is a 74 y.o. male last seen in March. He presents for a routine follow-up visit. Continues to do well, no angina symptoms. He remains functional with ADLs, enjoys working outdoors. We reviewed his medications which are outlined below, he reports no intolerances.  He continues to walk for exercise after completing cardiac rehabilitation. Echocardiogram from last year showed improvement in LVEF to the range of 45-50%. Lipids have been well controlled over time, he will be having follow-up lab work with Dr. Nadara Mustard at his next encounter.  Blood pressure today is reasonably well controlled. He states that his systolic with "95" at a recent visit with Dr. Carloyn Manner, but that he was asymptomatic at the time. We will keep an eye on this in case we need to adjust therapy.  Past Medical History  Diagnosis Date  . BPH (benign prostatic hyperplasia)   . Coronary atherosclerosis of native coronary artery     Multivessel, NSTEMI  s/p PTCA/DES to mid LAD 06/24/11,, known patent LIMA-LAD and occlusion of all vein grafts  . Hypertension   . Arthritis   . Ischemic cardiomyopathy     EF 35-40% by cath 06/24/11 (EF 37% by nuclear stress test 06/2010).  Marland Kitchen PAD (peripheral artery disease)     Status post left SFA stent - Dr. Burt Knack    Past Surgical History  Procedure Laterality Date  . Coronary artery bypass graft  [place], [place] - LIMA to LAD, SVG to RCA presumably  . Back surgery    . Left heart catheterization with coronary/graft angiogram  06/24/2011    Procedure: LEFT HEART CATHETERIZATION WITH Beatrix Fetters;  Surgeon: Burnell Blanks, MD;  Location: [facility] CATH LAB;  Service: Cardiovascular;;  . Lower  extremity angiogram N/A 10/30/2011    Procedure: LOWER EXTREMITY ANGIOGRAM;  Surgeon: Sherren Mocha, MD;  Location: [facility] CATH LAB;  Service: Cardiovascular;  Laterality: N/A;  . Left heart catheterization with coronary/graft angiogram N/A 02/26/2013    Procedure: LEFT HEART CATHETERIZATION WITH Beatrix Fetters;  Surgeon: Blane Ohara, MD;  Location: [facility] CATH LAB;  Service: Cardiovascular;  Laterality: N/A;  . Percutaneous coronary stent intervention (pci-s)  02/26/2013    Procedure: PERCUTANEOUS CORONARY STENT INTERVENTION (PCI-S);  Surgeon: Blane Ohara, MD;  Location: [facility] CATH LAB;  Service: Cardiovascular;;    Current Outpatient Prescriptions  Medication Sig Dispense Refill  . aspirin EC 81 MG tablet Take 81 mg by mouth daily.    . baclofen (LIORESAL) 10 MG tablet Take 1 tablet by mouth 3 (three) times daily.    . carvedilol (COREG) 12.5 MG tablet TAKE (1) TABLET TWICE A DAY WITH MEALS. 60 tablet 6  . clopidogrel (PLAVIX) 75 MG tablet Take 1 tablet (75 mg total) by mouth daily. 30 tablet 6  . gabapentin (NEURONTIN) 100 MG capsule Take 100 mg by mouth 3 (three) times daily.     . isosorbide mononitrate (IMDUR) 30 MG 24 hr tablet Take 30 mg by mouth 2 (two) times daily.     Marland Kitchen LORazepam (ATIVAN) 1 MG tablet Take 1 mg by mouth 2 (two) times daily as needed for anxiety (sleep).     . losartan (COZAAR) 50 MG tablet Take 50  mg by mouth daily.    . Multiple Vitamin (MULTIVITAMIN) tablet Take 1 tablet by mouth daily.    . nitroGLYCERIN (NITROSTAT) 0.4 MG SL tablet Place 1 tablet (0.4 mg total) under the tongue every 5 (five) minutes as needed. For chest pain 25 tablet 3  . Omega-3 Fatty Acids (FISH OIL) 1000 MG CAPS Take 2,000 mg by mouth daily.    . pantoprazole (PROTONIX) 40 MG tablet Take 40 mg by mouth daily.      No current facility-administered medications for this visit.    Allergies:  Review of patient's allergies indicates no known allergies.   Social History: The patient   reports that he quit smoking about 21 years ago. His smoking use included Cigarettes. He has a 80 pack-year smoking history. He has never used smokeless tobacco. He reports that he does not drink alcohol or use illicit drugs.   ROS:  Please see the history of present illness. Otherwise, complete review of systems is positive for none.  All other systems are reviewed and negative.   Physical Exam: VS:  BP 135/66 mmHg  Pulse 71  Ht 5\' 5"  (1.651 m)  Wt 175 lb 12.8 oz (79.742 kg)  BMI 29.25 kg/m2  SpO2 96%, BMI Body mass index is 29.25 kg/(m^2).  Wt Readings from Last 3 Encounters:  02/24/15 175 lb 12.8 oz (79.742 kg)  08/09/14 174 lb (78.926 kg)  10/26/13 171 lb 12.8 oz (77.928 kg)     Normally nourished appearing male, comfortable at rest.  HEENT: Conjunctiva and lids normal, oropharynx with moist mucosa.  Neck: Supple, no elevated JVP or bruits. No thyromegaly.  Lungs: Clear auscultation, nonlabored.  Cardiac: Regular rate and rhythm, soft systolic murmur, split S2, no gallop.  Abdomen: Soft, nontender, bowel sounds present.  Skin: Warm and dry. 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 Extremities: No pitting edema. Distal pulses 1+.  Musculoskeletal: No kyphosis.  Neuropsychiatric: Alert and oriented x3, affect appropriate.    ECG: ECG is not ordered today.   Recent Labwork:  July 2015: Cholesterol 136, triglycerides 121, HDL 24, LDL 88, hemoglobin 13.4, platelets 286, BUN 9, creatinine 0.9, potassium 4.0, AST 15, ALT 10  Other Studies Reviewed Today:  Echocardiogram from February 2015 showed improvement in LVEF to the range of 57-01%, grade 1 diastolic dysfunction, wall motion abnormalities consistent with ischemic heart disease, trivial aortic regurgitation, mild mitral regurgitation, severe left atrial enlargement.  Assessment and Plan:  1. Multivessel CAD status post remote CABG 1987, and more recently DES to the LAD in 2013 and DES  to the LIMA to LAD in 2014. He has known graft disease including all SVGs. He is doing well with good symptom control on medical therapy.  2. Essential hypertension, blood pressure is reasonably well controlled today. Continue to follow.  3. Ischemic cardiomyopathy, LVEF 45-50% by echocardiogram last year. No heart failure symptoms.  4. Peripheral arterial disease without active claudication.  Current medicines were reviewed with the patient today.  Disposition: FU with me in 6 months.   Signed, Satira Sark, MD, [facility] 02/24/2015 2:21 PM    [place] [facility] [place], [place], [place] [address] Phone: [phone]; Fax: [phone]

## 2015-04-04 DIAGNOSIS — M25579 Pain in unspecified ankle and joints of unspecified foot: Secondary | ICD-10-CM | POA: Diagnosis not present

## 2015-04-04 DIAGNOSIS — M79671 Pain in right foot: Secondary | ICD-10-CM | POA: Diagnosis not present

## 2015-04-25 DIAGNOSIS — B349 Viral infection, unspecified: Secondary | ICD-10-CM | POA: Diagnosis not present

## 2015-04-25 DIAGNOSIS — J028 Acute pharyngitis due to other specified organisms: Secondary | ICD-10-CM | POA: Diagnosis not present

## 2015-04-25 DIAGNOSIS — R0981 Nasal congestion: Secondary | ICD-10-CM | POA: Diagnosis not present

## 2015-04-28 DIAGNOSIS — R05 Cough: Secondary | ICD-10-CM | POA: Diagnosis not present

## 2015-04-28 DIAGNOSIS — J40 Bronchitis, not specified as acute or chronic: Secondary | ICD-10-CM | POA: Diagnosis not present

## 2015-05-17 DIAGNOSIS — B349 Viral infection, unspecified: Secondary | ICD-10-CM | POA: Diagnosis not present

## 2015-05-17 DIAGNOSIS — R05 Cough: Secondary | ICD-10-CM | POA: Diagnosis not present

## 2015-06-05 DIAGNOSIS — I209 Angina pectoris, unspecified: Secondary | ICD-10-CM | POA: Diagnosis not present

## 2015-06-05 DIAGNOSIS — I10 Essential (primary) hypertension: Secondary | ICD-10-CM | POA: Diagnosis not present

## 2015-06-20 DIAGNOSIS — M47816 Spondylosis without myelopathy or radiculopathy, lumbar region: Secondary | ICD-10-CM | POA: Diagnosis not present

## 2015-06-20 DIAGNOSIS — M4806 Spinal stenosis, lumbar region: Secondary | ICD-10-CM | POA: Diagnosis not present

## 2015-06-20 DIAGNOSIS — M5136 Other intervertebral disc degeneration, lumbar region: Secondary | ICD-10-CM | POA: Diagnosis not present

## 2015-06-26 DIAGNOSIS — M79671 Pain in right foot: Secondary | ICD-10-CM | POA: Diagnosis not present

## 2015-06-26 DIAGNOSIS — G5790 Unspecified mononeuropathy of unspecified lower limb: Secondary | ICD-10-CM | POA: Diagnosis not present

## 2015-06-26 DIAGNOSIS — M25579 Pain in unspecified ankle and joints of unspecified foot: Secondary | ICD-10-CM | POA: Diagnosis not present

## 2015-06-26 DIAGNOSIS — G600 Hereditary motor and sensory neuropathy: Secondary | ICD-10-CM | POA: Diagnosis not present

## 2015-08-03 ENCOUNTER — Other Ambulatory Visit: Payer: Self-pay | Admitting: Cardiology

## 2015-08-11 DIAGNOSIS — H35373 Puckering of macula, bilateral: Secondary | ICD-10-CM | POA: Diagnosis not present

## 2015-08-11 DIAGNOSIS — H35342 Macular cyst, hole, or pseudohole, left eye: Secondary | ICD-10-CM | POA: Diagnosis not present

## 2015-08-17 DIAGNOSIS — M47812 Spondylosis without myelopathy or radiculopathy, cervical region: Secondary | ICD-10-CM | POA: Diagnosis not present

## 2015-08-17 DIAGNOSIS — M47816 Spondylosis without myelopathy or radiculopathy, lumbar region: Secondary | ICD-10-CM | POA: Diagnosis not present

## 2015-08-28 DIAGNOSIS — L821 Other seborrheic keratosis: Secondary | ICD-10-CM | POA: Diagnosis not present

## 2015-08-28 DIAGNOSIS — L570 Actinic keratosis: Secondary | ICD-10-CM | POA: Diagnosis not present

## 2015-08-28 DIAGNOSIS — D485 Neoplasm of uncertain behavior of skin: Secondary | ICD-10-CM | POA: Diagnosis not present

## 2015-08-28 DIAGNOSIS — D1800 Hemangioma unspecified site: Secondary | ICD-10-CM | POA: Diagnosis not present

## 2015-08-30 DIAGNOSIS — G629 Polyneuropathy, unspecified: Secondary | ICD-10-CM | POA: Diagnosis not present

## 2015-08-30 DIAGNOSIS — J019 Acute sinusitis, unspecified: Secondary | ICD-10-CM | POA: Diagnosis not present

## 2015-08-30 DIAGNOSIS — J069 Acute upper respiratory infection, unspecified: Secondary | ICD-10-CM | POA: Diagnosis not present

## 2015-09-02 ENCOUNTER — Other Ambulatory Visit: Payer: Self-pay | Admitting: Cardiology

## 2015-10-02 DIAGNOSIS — G600 Hereditary motor and sensory neuropathy: Secondary | ICD-10-CM | POA: Diagnosis not present

## 2015-10-02 DIAGNOSIS — M79671 Pain in right foot: Secondary | ICD-10-CM | POA: Diagnosis not present

## 2015-10-02 DIAGNOSIS — M79672 Pain in left foot: Secondary | ICD-10-CM | POA: Diagnosis not present

## 2015-10-02 DIAGNOSIS — G5790 Unspecified mononeuropathy of unspecified lower limb: Secondary | ICD-10-CM | POA: Diagnosis not present

## 2015-10-03 ENCOUNTER — Other Ambulatory Visit: Payer: Self-pay | Admitting: *Deleted

## 2015-10-03 MED ORDER — CARVEDILOL 12.5 MG PO TABS: ORAL_TABLET | ORAL | Status: DC

## 2015-10-24 DIAGNOSIS — G5790 Unspecified mononeuropathy of unspecified lower limb: Secondary | ICD-10-CM | POA: Diagnosis not present

## 2015-10-24 DIAGNOSIS — M79671 Pain in right foot: Secondary | ICD-10-CM | POA: Diagnosis not present

## 2015-10-24 DIAGNOSIS — G600 Hereditary motor and sensory neuropathy: Secondary | ICD-10-CM | POA: Diagnosis not present

## 2015-10-24 DIAGNOSIS — M79672 Pain in left foot: Secondary | ICD-10-CM | POA: Diagnosis not present

## 2015-11-01 ENCOUNTER — Other Ambulatory Visit: Payer: Self-pay | Admitting: Cardiology

## 2015-11-03 DIAGNOSIS — S30860A Insect bite (nonvenomous) of lower back and pelvis, initial encounter: Secondary | ICD-10-CM | POA: Diagnosis not present

## 2015-11-06 DIAGNOSIS — M79671 Pain in right foot: Secondary | ICD-10-CM | POA: Diagnosis not present

## 2015-11-06 DIAGNOSIS — G600 Hereditary motor and sensory neuropathy: Secondary | ICD-10-CM | POA: Diagnosis not present

## 2015-11-06 DIAGNOSIS — M79672 Pain in left foot: Secondary | ICD-10-CM | POA: Diagnosis not present

## 2015-11-06 DIAGNOSIS — G5790 Unspecified mononeuropathy of unspecified lower limb: Secondary | ICD-10-CM | POA: Diagnosis not present

## 2015-11-16 ENCOUNTER — Other Ambulatory Visit: Payer: Self-pay | Admitting: Cardiology

## 2015-12-01 ENCOUNTER — Other Ambulatory Visit: Payer: Self-pay | Admitting: Cardiology

## 2015-12-04 DIAGNOSIS — G5790 Unspecified mononeuropathy of unspecified lower limb: Secondary | ICD-10-CM | POA: Diagnosis not present

## 2015-12-04 DIAGNOSIS — M79672 Pain in left foot: Secondary | ICD-10-CM | POA: Diagnosis not present

## 2015-12-04 DIAGNOSIS — G600 Hereditary motor and sensory neuropathy: Secondary | ICD-10-CM | POA: Diagnosis not present

## 2015-12-04 DIAGNOSIS — M79671 Pain in right foot: Secondary | ICD-10-CM | POA: Diagnosis not present

## 2015-12-19 DIAGNOSIS — K219 Gastro-esophageal reflux disease without esophagitis: Secondary | ICD-10-CM | POA: Diagnosis not present

## 2015-12-19 DIAGNOSIS — Z6829 Body mass index (BMI) 29.0-29.9, adult: Secondary | ICD-10-CM | POA: Diagnosis not present

## 2015-12-19 DIAGNOSIS — I10 Essential (primary) hypertension: Secondary | ICD-10-CM | POA: Diagnosis not present

## 2015-12-19 DIAGNOSIS — M1990 Unspecified osteoarthritis, unspecified site: Secondary | ICD-10-CM | POA: Diagnosis not present

## 2015-12-19 DIAGNOSIS — I209 Angina pectoris, unspecified: Secondary | ICD-10-CM | POA: Diagnosis not present

## 2015-12-25 ENCOUNTER — Other Ambulatory Visit (INDEPENDENT_AMBULATORY_CARE_PROVIDER_SITE_OTHER): Payer: Self-pay | Admitting: Otolaryngology

## 2015-12-25 ENCOUNTER — Ambulatory Visit (INDEPENDENT_AMBULATORY_CARE_PROVIDER_SITE_OTHER): Payer: Medicare Other | Admitting: Otolaryngology

## 2015-12-25 DIAGNOSIS — H9313 Tinnitus, bilateral: Secondary | ICD-10-CM | POA: Diagnosis not present

## 2015-12-25 DIAGNOSIS — H903 Sensorineural hearing loss, bilateral: Secondary | ICD-10-CM

## 2015-12-25 DIAGNOSIS — H905 Unspecified sensorineural hearing loss: Secondary | ICD-10-CM

## 2015-12-29 ENCOUNTER — Ambulatory Visit (HOSPITAL_COMMUNITY)
Admission: RE | Admit: 2015-12-29 | Discharge: 2015-12-29 | Disposition: A | Discharge status: Home / Self Care | Payer: Medicare Other | Source: Ambulatory Visit | Attending: Otolaryngology | Admitting: Otolaryngology

## 2015-12-29 DIAGNOSIS — R221 Localized swelling, mass and lump, neck: Secondary | ICD-10-CM | POA: Diagnosis not present

## 2015-12-29 DIAGNOSIS — I619 Nontraumatic intracerebral hemorrhage, unspecified: Secondary | ICD-10-CM | POA: Diagnosis not present

## 2015-12-29 DIAGNOSIS — G9389 Other specified disorders of brain: Secondary | ICD-10-CM | POA: Diagnosis not present

## 2015-12-29 DIAGNOSIS — H905 Unspecified sensorineural hearing loss: Secondary | ICD-10-CM | POA: Diagnosis not present

## 2015-12-29 LAB — POCT I-STAT CREATININE: CREATININE: 1.2 mg/dL (ref 0.61–1.24)

## 2015-12-29 MED ORDER — GADOBENATE DIMEGLUMINE 529 MG/ML IV SOLN
20.0000 mL | Freq: Once | INTRAVENOUS | Status: AC | PRN
  Administered 2015-12-29: 16 mL via INTRAVENOUS

## 2016-01-01 DIAGNOSIS — M79672 Pain in left foot: Secondary | ICD-10-CM | POA: Diagnosis not present

## 2016-01-01 DIAGNOSIS — M79671 Pain in right foot: Secondary | ICD-10-CM | POA: Diagnosis not present

## 2016-01-01 DIAGNOSIS — G600 Hereditary motor and sensory neuropathy: Secondary | ICD-10-CM | POA: Diagnosis not present

## 2016-01-01 DIAGNOSIS — G5790 Unspecified mononeuropathy of unspecified lower limb: Secondary | ICD-10-CM | POA: Diagnosis not present

## 2016-01-11 NOTE — Progress Notes (Signed)
Cardiology Office Note  Date: 01/12/2016   ID: Hunter Townsend, DOB 09-30-1940, MRN [id number]  PCP: Rory Percy, MD  Primary Cardiologist: Rozann Lesches, MD   Chief Complaint  Patient presents with  . Coronary Artery Disease  . Cardiomyopathy    History of Present Illness: Hunter Townsend is a 75 y.o. male last seen in September 2016. He presents for a routine follow-up visit. States that he has had a lot of trouble with the high heat and humidity this summer. More short of breath and having regular angina symptoms. He has used nitroglycerin as well at times. Otherwise reports compliance with his medications which are outlined below.  Most recent coronary intervention was DES to the LIMA to LAD in 2014. He has not had follow-up ischemic testing since that time. I reviewed his ECG today which shows sinus rhythm with decreased R wave progression, rule out old anterior infarct pattern, nonspecific T-wave changes.  He reports having follow-up later today with Dr. Nadara Mustard. Has had trouble with tingling in his feet and also hip pain. He does have PAD with prior left SFA stent placement by Dr. Burt Knack.  Past Medical History:  Diagnosis Date  . Arthritis   . BPH (benign prostatic hyperplasia)   . Coronary atherosclerosis of native coronary artery    Multivessel, NSTEMI  s/p PTCA/DES to mid LAD 06/24/11,, known patent LIMA-LAD and occlusion of all vein grafts  . Hypertension   . Ischemic cardiomyopathy    EF 35-40% by cath 06/24/11 (EF 37% by nuclear stress test 06/2010).  Marland Kitchen PAD (peripheral artery disease) (HCC)    Status post left SFA stent - Dr. Burt Knack    Past Surgical History:  Procedure Laterality Date  . BACK SURGERY    . CORONARY ARTERY BYPASS GRAFT  1987   [place], [place] - LIMA to LAD, SVG to RCA presumably  . LEFT HEART CATHETERIZATION WITH CORONARY/GRAFT ANGIOGRAM  06/24/2011   Procedure: LEFT HEART CATHETERIZATION WITH Beatrix Fetters;  Surgeon: Burnell Blanks, MD;  Location: [facility] CATH LAB;  Service: Cardiovascular;;  . LEFT HEART CATHETERIZATION WITH CORONARY/GRAFT ANGIOGRAM N/A 02/26/2013   Procedure: LEFT HEART CATHETERIZATION WITH Beatrix Fetters;  Surgeon: Blane Ohara, MD;  Location: [facility] CATH LAB;  Service: Cardiovascular;  Laterality: N/A;  . LOWER EXTREMITY ANGIOGRAM N/A 10/30/2011   Procedure: LOWER EXTREMITY ANGIOGRAM;  Surgeon: Sherren Mocha, MD;  Location: [facility] CATH LAB;  Service: Cardiovascular;  Laterality: N/A;  . PERCUTANEOUS CORONARY STENT INTERVENTION (PCI-S)  02/26/2013   Procedure: PERCUTANEOUS CORONARY STENT INTERVENTION (PCI-S);  Surgeon: Blane Ohara, MD;  Location: [facility] CATH LAB;  Service: Cardiovascular;;    Current Outpatient Prescriptions  Medication Sig Dispense Refill  . aspirin EC 81 MG tablet Take 81 mg by mouth daily.    . carvedilol (COREG) 12.5 MG tablet Take 1 tablet (12.5 mg total) by mouth 2 (two) times daily with a meal. 14 tablet 0  . clopidogrel (PLAVIX) 75 MG tablet TAKE 1 TABLET BY MOUTH ONCE DAILY. PATIENT NEEDS TO BE SEEN. 15 tablet 0  . gabapentin (NEURONTIN) 100 MG capsule Take 100 mg by mouth 3 (three) times daily.     . isosorbide mononitrate (IMDUR) 30 MG 24 hr tablet Take 30 mg by mouth 2 (two) times daily.     Marland Kitchen LORazepam (ATIVAN) 1 MG tablet Take 1 mg by mouth 2 (two) times daily as needed for anxiety (sleep).     . losartan (COZAAR) 50 MG tablet Take 50 mg  by mouth daily.    . Multiple Vitamin (MULTIVITAMIN) tablet Take 1 tablet by mouth daily.    . nitroGLYCERIN (NITROSTAT) 0.4 MG SL tablet Place 1 tablet (0.4 mg total) under the tongue every 5 (five) minutes as needed. For chest pain 25 tablet 3  . Omega-3 Fatty Acids (FISH OIL) 1000 MG CAPS Take 2,000 mg by mouth daily.    . pantoprazole (PROTONIX) 40 MG tablet Take 40 mg by mouth daily.      No current facility-administered medications for this visit.    Allergies:  Review of patient's allergies indicates no known allergies.     Social History: The patient  reports that he quit smoking about 22 years ago. His smoking use included Cigarettes. He has a 80.00 pack-year smoking history. He has never used smokeless tobacco. He reports that he does not drink alcohol or use drugs.   ROS:  Please see the history of present illness. Otherwise, complete review of systems is positive for occasional ringing in his ears (had recent head MRI - Dr. Benjamine Mola).  All other systems are reviewed and negative.   Physical Exam: VS:  BP 138/70   Pulse 71   Ht 5\' 5"  (1.651 m)   Wt 181 lb (82.1 kg)   SpO2 98%   BMI 30.12 kg/m , BMI Body mass index is 30.12 kg/m.  Wt Readings from Last 3 Encounters:  01/12/16 181 lb (82.1 kg)  02/24/15 175 lb 12.8 oz (79.7 kg)  08/09/14 174 lb (78.9 kg)    Normally nourished appearing male, appears comfortable at rest.  HEENT: Conjunctiva and lids normal, oropharynx.  Neck: Supple, no elevated JVP or bruits. No thyromegaly.  Lungs: Clear auscultation, nonlabored.  Cardiac: Regular rate and rhythm, soft systolic murmur, split S2, no gallop.  Abdomen: Soft, nontender, bowel sounds present.  Skin: Warm and dry. Extremities: No pitting edema. Distal pulses 1+.  Musculoskeletal: No kyphosis.  Neuropsychiatric: Alert and oriented x3, affect appropriate.   ECG: I personally reviewed the tracing from 10/01/2013 which showed sinus rhythm.  Recent Labwork: 12/29/2015: Creatinine, Ser 1.20   Other Studies Reviewed Today:  Echocardiogram February 2015: LVEF [id number], grade 1 diastolic dysfunction, wall motion abnormalities consistent with ischemic heart disease, trivial aortic regurgitation, mild mitral regurgitation, severe left atrial enlargement.  Assessment and Plan:  1. Multivessel CAD status post CABG with graft disease including all SVGs and status post DES to the LAD 2013 and DES to the LIMA to LAD 2014. He is reporting increased dyspnea and angina over the summer months. Also increased  nitroglycerin use. We will obtain a follow-up Lexiscan Myoview to reassess ischemic burden. Can decide whether to try and further intensify medical therapy (Ranexa?), or consider angiography.  2. Claudication symptoms with known PAD status post left SFA stenting. Follow-up arterial Dopplers and ABI.  3. Essential hypertension, blood pressure is adequately controlled a.  4. Ischemic cardiomyopathy with LVEF 45-50% as of 2015.  Current medicines were reviewed with the patient today.   Orders Placed This Encounter  Procedures  . NM Myocar Multi W/Spect W/Wall Motion / EF  . Myocardial Perfusion Imaging  . EKG 12-Lead    Disposition: 6 month follow-up, sooner depending on stress testing.  Signed, Satira Sark, MD, [facility] 01/12/2016 9:16 AM    [facility] [place], [place], [place] [address] Phone: [phone]; Fax: [phone]

## 2016-01-12 ENCOUNTER — Encounter: Payer: Self-pay | Admitting: Cardiology

## 2016-01-12 ENCOUNTER — Ambulatory Visit (INDEPENDENT_AMBULATORY_CARE_PROVIDER_SITE_OTHER): Payer: Medicare Other | Admitting: Cardiology

## 2016-01-12 ENCOUNTER — Encounter: Payer: Self-pay | Admitting: *Deleted

## 2016-01-12 VITALS — BP 138/70 | HR 71 | Ht 65.0 in | Wt 181.0 lb

## 2016-01-12 DIAGNOSIS — I2510 Atherosclerotic heart disease of native coronary artery without angina pectoris: Secondary | ICD-10-CM

## 2016-01-12 DIAGNOSIS — I255 Ischemic cardiomyopathy: Secondary | ICD-10-CM | POA: Diagnosis not present

## 2016-01-12 DIAGNOSIS — H9313 Tinnitus, bilateral: Secondary | ICD-10-CM | POA: Diagnosis not present

## 2016-01-12 DIAGNOSIS — I10 Essential (primary) hypertension: Secondary | ICD-10-CM | POA: Diagnosis not present

## 2016-01-12 DIAGNOSIS — I739 Peripheral vascular disease, unspecified: Secondary | ICD-10-CM

## 2016-01-12 DIAGNOSIS — Z6829 Body mass index (BMI) 29.0-29.9, adult: Secondary | ICD-10-CM | POA: Diagnosis not present

## 2016-01-12 DIAGNOSIS — D329 Benign neoplasm of meninges, unspecified: Secondary | ICD-10-CM | POA: Diagnosis not present

## 2016-01-12 NOTE — Patient Instructions (Signed)
Medication Instructions:  Continue all current medications.  Labwork: none  Testing/Procedures:  Your physician has requested that you have a lexiscan myoview. For further information please visit [email or web address]. Please follow instruction sheet, as given.  Your physician has requested that you have an ankle brachial index (ABI). During this test an ultrasound and blood pressure cuff are used to evaluate the arteries that supply the arms and legs with blood. Allow thirty minutes for this exam. There are no restrictions or special instructions.  Lower extremity arterial doppler   Office will contact with results via phone or letter.    Follow-Up: Your physician wants you to follow up in: 6 months.  You will receive a reminder letter in the mail one-two months in advance.  If you don't receive a letter, please call our office to schedule the follow up appointment   Any Other Special Instructions Will Be Listed Below (If Applicable).  If you need a refill on your cardiac medications before your next appointment, please call your pharmacy.

## 2016-01-24 ENCOUNTER — Other Ambulatory Visit: Payer: Self-pay

## 2016-01-31 ENCOUNTER — Ambulatory Visit: Payer: Medicare Other

## 2016-01-31 DIAGNOSIS — R200 Anesthesia of skin: Secondary | ICD-10-CM | POA: Diagnosis not present

## 2016-01-31 DIAGNOSIS — I739 Peripheral vascular disease, unspecified: Secondary | ICD-10-CM

## 2016-01-31 LAB — VAS US LOWER EXTREMITY ARTERIAL DUPLEX
LATIBDISTDIA: 8 cm/s
LATIBDISTSYS: 52 cm/s
LEFT POPLITEAL DIST DYS VEL: 0 cm/s
LEFT POPLITEAL PROX DYS VEL: 0 cm/s
LEFT SFA PROX DYS VEL: 9 cm/s
LPERODISTSYS: 29 cm/s
LPTIBDISTDIA: 5 cm/s
LPTIBDISTSYS: 87 cm/s
LSFADISTDYSV: 17 cm/s
LSFAMIDVEL: 8 cm/s
LSFMPSV: 161 cm/s
LSFPPSV: 182 cm/s
Left popliteal dist sys PSV: 77 cm/s
Left popliteal prox sys PSV: 87 cm/s
Left super femoral dist sys PSV: 154 cm/s

## 2016-02-01 ENCOUNTER — Encounter (HOSPITAL_COMMUNITY): Payer: Medicare Other

## 2016-02-01 ENCOUNTER — Encounter (HOSPITAL_COMMUNITY): Payer: No Typology Code available for payment source

## 2016-02-01 DIAGNOSIS — R0602 Shortness of breath: Secondary | ICD-10-CM | POA: Diagnosis not present

## 2016-02-01 DIAGNOSIS — D320 Benign neoplasm of cerebral meninges: Secondary | ICD-10-CM | POA: Diagnosis not present

## 2016-02-01 DIAGNOSIS — G939 Disorder of brain, unspecified: Secondary | ICD-10-CM | POA: Diagnosis not present

## 2016-02-01 DIAGNOSIS — H9313 Tinnitus, bilateral: Secondary | ICD-10-CM | POA: Diagnosis not present

## 2016-02-01 DIAGNOSIS — R062 Wheezing: Secondary | ICD-10-CM | POA: Diagnosis not present

## 2016-02-01 DIAGNOSIS — G9389 Other specified disorders of brain: Secondary | ICD-10-CM | POA: Diagnosis not present

## 2016-02-01 DIAGNOSIS — R269 Unspecified abnormalities of gait and mobility: Secondary | ICD-10-CM | POA: Diagnosis not present

## 2016-02-02 ENCOUNTER — Telehealth: Payer: Self-pay | Admitting: *Deleted

## 2016-02-02 NOTE — Telephone Encounter (Signed)
-----   Message from Satira Sark, MD sent at 02/01/2016  7:53 AM EDT ----- Results reviewed. ABI normal on the right and only mildly decreased on the left. Continue with walking plan and medical therapy. A copy of this test should be forwarded to Rory Percy, MD.

## 2016-02-02 NOTE — Telephone Encounter (Signed)
Patient informed and copy sent to PCP. 

## 2016-02-02 NOTE — Telephone Encounter (Signed)
-----   Message from Satira Sark, MD sent at 02/01/2016  7:54 AM EDT ----- Results reviewed. This patient indicates patency of the left mid SFA stent and nonobstructive atherosclerosis of the common and superficial femoral artery on the left. A copy of this test should be forwarded to Rory Percy, MD.

## 2016-02-05 ENCOUNTER — Inpatient Hospital Stay (HOSPITAL_COMMUNITY): Admission: RE | Admit: 2016-02-05 | Payer: Medicare Other | Source: Ambulatory Visit

## 2016-02-05 ENCOUNTER — Encounter (HOSPITAL_COMMUNITY)
Admission: RE | Admit: 2016-02-05 | Discharge: 2016-02-05 | Disposition: A | Discharge status: Home / Self Care | Payer: Medicare Other | Source: Ambulatory Visit | Attending: Cardiology | Admitting: Cardiology

## 2016-02-05 ENCOUNTER — Encounter (HOSPITAL_COMMUNITY): Payer: Self-pay

## 2016-02-05 DIAGNOSIS — I2510 Atherosclerotic heart disease of native coronary artery without angina pectoris: Secondary | ICD-10-CM | POA: Insufficient documentation

## 2016-02-05 LAB — NM MYOCAR MULTI W/SPECT W/WALL MOTION / EF
CSEPPHR: 87 {beats}/min
Rest HR: 76 {beats}/min

## 2016-02-05 MED ORDER — TECHNETIUM TC 99M TETROFOSMIN IV KIT
30.0000 | PACK | Freq: Once | INTRAVENOUS | Status: AC | PRN
  Administered 2016-02-05: 30 via INTRAVENOUS

## 2016-02-05 MED ORDER — SODIUM CHLORIDE 0.9% FLUSH
INTRAVENOUS | Status: AC
  Administered 2016-02-05: 10 mL via INTRAVENOUS
  Filled 2016-02-05: qty 10

## 2016-02-05 MED ORDER — REGADENOSON 0.4 MG/5ML IV SOLN
INTRAVENOUS | Status: AC
  Administered 2016-02-05: 0.4 mg via INTRAVENOUS
  Filled 2016-02-05: qty 5

## 2016-02-05 MED ORDER — TECHNETIUM TC 99M TETROFOSMIN IV KIT
10.0000 | PACK | Freq: Once | INTRAVENOUS | Status: AC | PRN
  Administered 2016-02-05: 10 via INTRAVENOUS

## 2016-02-06 DIAGNOSIS — H35342 Macular cyst, hole, or pseudohole, left eye: Secondary | ICD-10-CM | POA: Diagnosis not present

## 2016-02-06 DIAGNOSIS — H35373 Puckering of macula, bilateral: Secondary | ICD-10-CM | POA: Diagnosis not present

## 2016-02-07 ENCOUNTER — Telehealth: Payer: Self-pay | Admitting: *Deleted

## 2016-02-07 DIAGNOSIS — I2510 Atherosclerotic heart disease of native coronary artery without angina pectoris: Secondary | ICD-10-CM

## 2016-02-07 DIAGNOSIS — G5790 Unspecified mononeuropathy of unspecified lower limb: Secondary | ICD-10-CM | POA: Diagnosis not present

## 2016-02-07 DIAGNOSIS — I255 Ischemic cardiomyopathy: Secondary | ICD-10-CM

## 2016-02-07 DIAGNOSIS — G600 Hereditary motor and sensory neuropathy: Secondary | ICD-10-CM | POA: Diagnosis not present

## 2016-02-07 DIAGNOSIS — M79671 Pain in right foot: Secondary | ICD-10-CM | POA: Diagnosis not present

## 2016-02-07 DIAGNOSIS — M79672 Pain in left foot: Secondary | ICD-10-CM | POA: Diagnosis not present

## 2016-02-07 NOTE — Telephone Encounter (Signed)
-----   Message from Satira Sark, MD sent at 02/05/2016  6:33 PM EDT ----- Results reviewed. Study suggestive of scar from prior infarction without significant ischemia at this time. Would get an echocardiogram to further evaluate LVEF. We may be able to adjust medications and improve symptoms to some degree. A copy of this test should be forwarded to Rory Percy, MD.

## 2016-02-07 NOTE — Telephone Encounter (Signed)
Patient informed and copy sent to PCP. 

## 2016-02-15 DIAGNOSIS — G319 Degenerative disease of nervous system, unspecified: Secondary | ICD-10-CM | POA: Diagnosis not present

## 2016-02-15 DIAGNOSIS — H9313 Tinnitus, bilateral: Secondary | ICD-10-CM | POA: Diagnosis not present

## 2016-02-15 DIAGNOSIS — G939 Disorder of brain, unspecified: Secondary | ICD-10-CM | POA: Diagnosis not present

## 2016-02-15 DIAGNOSIS — H5340 Unspecified visual field defects: Secondary | ICD-10-CM | POA: Diagnosis not present

## 2016-02-15 DIAGNOSIS — D320 Benign neoplasm of cerebral meninges: Secondary | ICD-10-CM | POA: Diagnosis not present

## 2016-02-15 DIAGNOSIS — H538 Other visual disturbances: Secondary | ICD-10-CM | POA: Diagnosis not present

## 2016-02-15 DIAGNOSIS — I6501 Occlusion and stenosis of right vertebral artery: Secondary | ICD-10-CM | POA: Diagnosis not present

## 2016-02-20 DIAGNOSIS — M47812 Spondylosis without myelopathy or radiculopathy, cervical region: Secondary | ICD-10-CM | POA: Diagnosis not present

## 2016-02-20 DIAGNOSIS — M47816 Spondylosis without myelopathy or radiculopathy, lumbar region: Secondary | ICD-10-CM | POA: Diagnosis not present

## 2016-02-28 ENCOUNTER — Ambulatory Visit (INDEPENDENT_AMBULATORY_CARE_PROVIDER_SITE_OTHER): Payer: Medicare Other

## 2016-02-28 ENCOUNTER — Other Ambulatory Visit: Payer: Self-pay

## 2016-02-28 DIAGNOSIS — I255 Ischemic cardiomyopathy: Secondary | ICD-10-CM | POA: Diagnosis not present

## 2016-02-28 DIAGNOSIS — I2510 Atherosclerotic heart disease of native coronary artery without angina pectoris: Secondary | ICD-10-CM

## 2016-03-01 ENCOUNTER — Telehealth: Payer: Self-pay | Admitting: *Deleted

## 2016-03-01 NOTE — Telephone Encounter (Signed)
-----   Message from Satira Sark, MD sent at 02/29/2016  8:03 AM EDT ----- Results reviewed. Overall LVEF looks better than prior assessment, around 45% at this time. With recent stress testing demonstrating infarct scar but no significant ischemia, we will try medication adjustments. If he can get it at reasonable expense, might want to consider switching from Imdur to Ranexa to see if that makes difference. A copy of this test should be forwarded to Rory Percy, MD.

## 2016-03-01 NOTE — Telephone Encounter (Signed)
Patient informed and is willing to change from imdur to ranexa. Please advise dose and directions. Patient uses [facility].

## 2016-03-02 NOTE — Telephone Encounter (Signed)
Would start Ranexa 500 mg PO BID.

## 2016-03-04 MED ORDER — RANOLAZINE ER 500 MG PO TB12: 500.0000 mg | ORAL_TABLET | Freq: Two times a day (BID) | ORAL | 3 refills | Status: DC

## 2016-03-04 NOTE — Telephone Encounter (Signed)
Patient informed and rx sent to [facility].

## 2016-03-05 ENCOUNTER — Telehealth: Payer: Self-pay | Admitting: Cardiology

## 2016-03-05 DIAGNOSIS — M79672 Pain in left foot: Secondary | ICD-10-CM | POA: Diagnosis not present

## 2016-03-05 DIAGNOSIS — M79671 Pain in right foot: Secondary | ICD-10-CM | POA: Diagnosis not present

## 2016-03-05 DIAGNOSIS — G5790 Unspecified mononeuropathy of unspecified lower limb: Secondary | ICD-10-CM | POA: Diagnosis not present

## 2016-03-05 DIAGNOSIS — G600 Hereditary motor and sensory neuropathy: Secondary | ICD-10-CM | POA: Diagnosis not present

## 2016-03-05 NOTE — Telephone Encounter (Signed)
Patient walked in asking if he was to start new medication along with old?   He could not remember medication name

## 2016-03-05 NOTE — Telephone Encounter (Signed)
Pt wanted to be sure that he was supposed to be taking only Ranexa 500 mg bid and to stop Imdur. Per 10/6 phone note - told pt he should only be taking Ranexa and to d/c imdur. Pt voiced understanding

## 2016-03-11 DIAGNOSIS — H35373 Puckering of macula, bilateral: Secondary | ICD-10-CM | POA: Diagnosis not present

## 2016-03-11 DIAGNOSIS — H524 Presbyopia: Secondary | ICD-10-CM | POA: Diagnosis not present

## 2016-03-11 DIAGNOSIS — H35342 Macular cyst, hole, or pseudohole, left eye: Secondary | ICD-10-CM | POA: Diagnosis not present

## 2016-03-11 DIAGNOSIS — Z961 Presence of intraocular lens: Secondary | ICD-10-CM | POA: Diagnosis not present

## 2016-03-13 DIAGNOSIS — Z23 Encounter for immunization: Secondary | ICD-10-CM | POA: Diagnosis not present

## 2016-03-28 ENCOUNTER — Encounter (HOSPITAL_COMMUNITY)
Admission: EM | Disposition: A | Discharge status: Home / Self Care | Payer: Self-pay | Source: Home / Self Care | Attending: Cardiology

## 2016-03-28 ENCOUNTER — Encounter (HOSPITAL_COMMUNITY): Payer: Self-pay

## 2016-03-28 ENCOUNTER — Inpatient Hospital Stay (HOSPITAL_COMMUNITY)
Admission: EM | Admit: 2016-03-28 | Discharge: 2016-03-29 | DRG: 287 | Disposition: A | Discharge status: Home / Self Care | Payer: Medicare Other | Attending: Cardiology | Admitting: Cardiology

## 2016-03-28 ENCOUNTER — Emergency Department (HOSPITAL_COMMUNITY): Payer: Medicare Other

## 2016-03-28 DIAGNOSIS — I2582 Chronic total occlusion of coronary artery: Secondary | ICD-10-CM | POA: Diagnosis present

## 2016-03-28 DIAGNOSIS — Z79899 Other long term (current) drug therapy: Secondary | ICD-10-CM | POA: Diagnosis not present

## 2016-03-28 DIAGNOSIS — N400 Enlarged prostate without lower urinary tract symptoms: Secondary | ICD-10-CM | POA: Diagnosis present

## 2016-03-28 DIAGNOSIS — R079 Chest pain, unspecified: Secondary | ICD-10-CM

## 2016-03-28 DIAGNOSIS — Z7902 Long term (current) use of antithrombotics/antiplatelets: Secondary | ICD-10-CM | POA: Diagnosis not present

## 2016-03-28 DIAGNOSIS — I252 Old myocardial infarction: Secondary | ICD-10-CM | POA: Diagnosis not present

## 2016-03-28 DIAGNOSIS — I25110 Atherosclerotic heart disease of native coronary artery with unstable angina pectoris: Secondary | ICD-10-CM

## 2016-03-28 DIAGNOSIS — I25710 Atherosclerosis of autologous vein coronary artery bypass graft(s) with unstable angina pectoris: Secondary | ICD-10-CM | POA: Diagnosis present

## 2016-03-28 DIAGNOSIS — I110 Hypertensive heart disease with heart failure: Secondary | ICD-10-CM | POA: Diagnosis not present

## 2016-03-28 DIAGNOSIS — Z8249 Family history of ischemic heart disease and other diseases of the circulatory system: Secondary | ICD-10-CM

## 2016-03-28 DIAGNOSIS — R072 Precordial pain: Secondary | ICD-10-CM | POA: Diagnosis not present

## 2016-03-28 DIAGNOSIS — Z9861 Coronary angioplasty status: Secondary | ICD-10-CM | POA: Diagnosis not present

## 2016-03-28 DIAGNOSIS — I739 Peripheral vascular disease, unspecified: Secondary | ICD-10-CM | POA: Diagnosis present

## 2016-03-28 DIAGNOSIS — I2510 Atherosclerotic heart disease of native coronary artery without angina pectoris: Secondary | ICD-10-CM

## 2016-03-28 DIAGNOSIS — I5020 Unspecified systolic (congestive) heart failure: Secondary | ICD-10-CM

## 2016-03-28 DIAGNOSIS — I255 Ischemic cardiomyopathy: Secondary | ICD-10-CM | POA: Diagnosis present

## 2016-03-28 DIAGNOSIS — R0789 Other chest pain: Secondary | ICD-10-CM | POA: Diagnosis not present

## 2016-03-28 DIAGNOSIS — Z7982 Long term (current) use of aspirin: Secondary | ICD-10-CM

## 2016-03-28 DIAGNOSIS — I5043 Acute on chronic combined systolic (congestive) and diastolic (congestive) heart failure: Secondary | ICD-10-CM | POA: Diagnosis present

## 2016-03-28 DIAGNOSIS — I200 Unstable angina: Secondary | ICD-10-CM | POA: Diagnosis not present

## 2016-03-28 DIAGNOSIS — Z951 Presence of aortocoronary bypass graft: Secondary | ICD-10-CM

## 2016-03-28 DIAGNOSIS — E785 Hyperlipidemia, unspecified: Secondary | ICD-10-CM | POA: Diagnosis not present

## 2016-03-28 HISTORY — DX: Acute myocardial infarction, unspecified: I21.9

## 2016-03-28 HISTORY — DX: Heart failure, unspecified: I50.9

## 2016-03-28 HISTORY — PX: CARDIAC CATHETERIZATION: SHX172

## 2016-03-28 LAB — CBC
HCT: 40.7 % (ref 39.0–52.0)
HEMOGLOBIN: 14.1 g/dL (ref 13.0–17.0)
MCH: 31.5 pg (ref 26.0–34.0)
MCHC: 34.6 g/dL (ref 30.0–36.0)
MCV: 91.1 fL (ref 78.0–100.0)
Platelets: 268 10*3/uL (ref 150–400)
RBC: 4.47 MIL/uL (ref 4.22–5.81)
RDW: 12.9 % (ref 11.5–15.5)
WBC: 7.5 10*3/uL (ref 4.0–10.5)

## 2016-03-28 LAB — COMPREHENSIVE METABOLIC PANEL
ALT: 17 U/L (ref 17–63)
AST: 22 U/L (ref 15–41)
Albumin: 3.9 g/dL (ref 3.5–5.0)
Alkaline Phosphatase: 99 U/L (ref 38–126)
Anion gap: 7 (ref 5–15)
BILIRUBIN TOTAL: 1.7 mg/dL — AB (ref 0.3–1.2)
BUN: 8 mg/dL (ref 6–20)
CO2: 25 mmol/L (ref 22–32)
CREATININE: 1.03 mg/dL (ref 0.61–1.24)
Calcium: 9.4 mg/dL (ref 8.9–10.3)
Chloride: 106 mmol/L (ref 101–111)
Glucose, Bld: 110 mg/dL — ABNORMAL HIGH (ref 65–99)
POTASSIUM: 4.2 mmol/L (ref 3.5–5.1)
Sodium: 138 mmol/L (ref 135–145)
TOTAL PROTEIN: 7.2 g/dL (ref 6.5–8.1)

## 2016-03-28 LAB — TROPONIN I: TROPONIN I: 0.11 ng/mL — AB (ref <0.03)

## 2016-03-28 LAB — I-STAT TROPONIN, ED
TROPONIN I, POC: 0 ng/mL (ref 0.00–0.08)
TROPONIN I, POC: 0.01 ng/mL (ref 0.00–0.08)
TROPONIN I, POC: 0 ng/mL (ref 0.00–0.08)

## 2016-03-28 LAB — BRAIN NATRIURETIC PEPTIDE: B NATRIURETIC PEPTIDE 5: 217.1 pg/mL — AB (ref 0.0–100.0)

## 2016-03-28 SURGERY — LEFT HEART CATH AND CORS/GRAFTS ANGIOGRAPHY
Anesthesia: LOCAL

## 2016-03-28 MED ORDER — PANTOPRAZOLE SODIUM 40 MG PO TBEC
40.0000 mg | DELAYED_RELEASE_TABLET | Freq: Every day | ORAL | Status: DC
  Administered 2016-03-29: 40 mg via ORAL
  Filled 2016-03-28: qty 1

## 2016-03-28 MED ORDER — HEPARIN (PORCINE) IN NACL 2-0.9 UNIT/ML-% IJ SOLN
INTRAMUSCULAR | Status: DC | PRN
  Administered 2016-03-28: 1000 mL

## 2016-03-28 MED ORDER — SODIUM CHLORIDE 0.9% FLUSH: 3.0000 mL | INTRAVENOUS | Status: DC | PRN

## 2016-03-28 MED ORDER — CARVEDILOL 3.125 MG PO TABS
18.7500 mg | ORAL_TABLET | Freq: Two times a day (BID) | ORAL | Status: DC
  Administered 2016-03-28 – 2016-03-29 (×2): 18.75 mg via ORAL
  Filled 2016-03-28 (×2): qty 2

## 2016-03-28 MED ORDER — SODIUM CHLORIDE 0.9 % IV SOLN
INTRAVENOUS | Status: DC
  Administered 2016-03-28: 125 mL/h via INTRAVENOUS

## 2016-03-28 MED ORDER — HEPARIN BOLUS VIA INFUSION
4000.0000 [IU] | Freq: Once | INTRAVENOUS | Status: DC
  Filled 2016-03-28: qty 4000

## 2016-03-28 MED ORDER — NITROGLYCERIN 0.4 MG SL SUBL
0.4000 mg | SUBLINGUAL_TABLET | SUBLINGUAL | Status: AC | PRN
  Administered 2016-03-28 (×2): 0.4 mg via SUBLINGUAL
  Filled 2016-03-28: qty 1

## 2016-03-28 MED ORDER — LORAZEPAM 0.5 MG PO TABS: 1.0000 mg | ORAL_TABLET | Freq: Two times a day (BID) | ORAL | Status: DC | PRN

## 2016-03-28 MED ORDER — FUROSEMIDE 10 MG/ML IJ SOLN
40.0000 mg | Freq: Two times a day (BID) | INTRAMUSCULAR | Status: DC
  Administered 2016-03-29: 40 mg via INTRAVENOUS
  Filled 2016-03-28: qty 4

## 2016-03-28 MED ORDER — ASPIRIN EC 81 MG PO TBEC
81.0000 mg | DELAYED_RELEASE_TABLET | Freq: Every day | ORAL | Status: DC
  Administered 2016-03-29: 81 mg via ORAL
  Filled 2016-03-28: qty 1

## 2016-03-28 MED ORDER — ISOSORBIDE MONONITRATE ER 30 MG PO TB24
30.0000 mg | ORAL_TABLET | Freq: Every day | ORAL | Status: DC
  Administered 2016-03-29: 09:00:00 30 mg via ORAL
  Filled 2016-03-28: qty 1

## 2016-03-28 MED ORDER — IOPAMIDOL (ISOVUE-370) INJECTION 76%
INTRAVENOUS | Status: AC
  Filled 2016-03-28: qty 125

## 2016-03-28 MED ORDER — VERAPAMIL HCL 2.5 MG/ML IV SOLN
INTRAVENOUS | Status: AC
  Filled 2016-03-28: qty 2

## 2016-03-28 MED ORDER — LABETALOL HCL 5 MG/ML IV SOLN
INTRAVENOUS | Status: DC | PRN
  Administered 2016-03-28: 10 mg via INTRAVENOUS

## 2016-03-28 MED ORDER — RANOLAZINE ER 500 MG PO TB12
500.0000 mg | ORAL_TABLET | Freq: Two times a day (BID) | ORAL | Status: DC
  Administered 2016-03-28 – 2016-03-29 (×2): 500 mg via ORAL
  Filled 2016-03-28 (×2): qty 1

## 2016-03-28 MED ORDER — SODIUM CHLORIDE 0.9 % IV BOLUS (SEPSIS)
1000.0000 mL | Freq: Once | INTRAVENOUS | Status: AC
  Administered 2016-03-28: 1000 mL via INTRAVENOUS

## 2016-03-28 MED ORDER — HEPARIN SODIUM (PORCINE) 1000 UNIT/ML IJ SOLN
INTRAMUSCULAR | Status: AC
  Filled 2016-03-28: qty 1

## 2016-03-28 MED ORDER — LOSARTAN POTASSIUM 50 MG PO TABS: 25.0000 mg | ORAL_TABLET | Freq: Every day | ORAL | Status: DC

## 2016-03-28 MED ORDER — ALUM & MAG HYDROXIDE-SIMETH 200-200-20 MG/5ML PO SUSP
30.0000 mL | ORAL | Status: DC | PRN
  Administered 2016-03-28: 30 mL via ORAL
  Filled 2016-03-28: qty 30

## 2016-03-28 MED ORDER — VERAPAMIL HCL 2.5 MG/ML IV SOLN
INTRAVENOUS | Status: DC | PRN
  Administered 2016-03-28: 10 mL via INTRA_ARTERIAL

## 2016-03-28 MED ORDER — SODIUM CHLORIDE 0.9 % IV SOLN: INTRAVENOUS | Status: AC

## 2016-03-28 MED ORDER — HEPARIN (PORCINE) IN NACL 100-0.45 UNIT/ML-% IJ SOLN
950.0000 [IU]/h | INTRAMUSCULAR | Status: DC
  Filled 2016-03-28: qty 250

## 2016-03-28 MED ORDER — NITROGLYCERIN 0.4 MG SL SUBL: 0.4000 mg | SUBLINGUAL_TABLET | SUBLINGUAL | Status: DC | PRN

## 2016-03-28 MED ORDER — SODIUM CHLORIDE 0.9% FLUSH
3.0000 mL | Freq: Two times a day (BID) | INTRAVENOUS | Status: DC
  Administered 2016-03-29: 10:00:00 3 mL via INTRAVENOUS

## 2016-03-28 MED ORDER — ADULT MULTIVITAMIN W/MINERALS CH
1.0000 | ORAL_TABLET | Freq: Every day | ORAL | Status: DC
  Administered 2016-03-29: 1 via ORAL
  Filled 2016-03-28: qty 1

## 2016-03-28 MED ORDER — SODIUM CHLORIDE 0.9 % IV SOLN: 250.0000 mL | INTRAVENOUS | Status: DC | PRN

## 2016-03-28 MED ORDER — HEPARIN (PORCINE) IN NACL 2-0.9 UNIT/ML-% IJ SOLN
INTRAMUSCULAR | Status: AC
  Filled 2016-03-28: qty 1000

## 2016-03-28 MED ORDER — IOPAMIDOL (ISOVUE-370) INJECTION 76%
INTRAVENOUS | Status: DC | PRN
  Administered 2016-03-28: 100 mL via INTRA_ARTERIAL

## 2016-03-28 MED ORDER — ONDANSETRON HCL 4 MG/2ML IJ SOLN
4.0000 mg | Freq: Once | INTRAMUSCULAR | Status: AC
  Administered 2016-03-28: 4 mg via INTRAVENOUS
  Filled 2016-03-28: qty 2

## 2016-03-28 MED ORDER — CLOPIDOGREL BISULFATE 75 MG PO TABS
75.0000 mg | ORAL_TABLET | Freq: Every day | ORAL | Status: DC
  Administered 2016-03-29: 75 mg via ORAL
  Filled 2016-03-28: qty 1

## 2016-03-28 MED ORDER — LIDOCAINE HCL (PF) 1 % IJ SOLN
INTRAMUSCULAR | Status: AC
  Filled 2016-03-28: qty 30

## 2016-03-28 MED ORDER — FENTANYL CITRATE (PF) 100 MCG/2ML IJ SOLN
INTRAMUSCULAR | Status: DC | PRN
  Administered 2016-03-28: 50 ug via INTRAVENOUS

## 2016-03-28 MED ORDER — MIDAZOLAM HCL 2 MG/2ML IJ SOLN
INTRAMUSCULAR | Status: AC
  Filled 2016-03-28: qty 2

## 2016-03-28 MED ORDER — HEPARIN SODIUM (PORCINE) 1000 UNIT/ML IJ SOLN
INTRAMUSCULAR | Status: DC | PRN
  Administered 2016-03-28: 4000 [IU] via INTRAVENOUS

## 2016-03-28 MED ORDER — OMEGA-3-ACID ETHYL ESTERS 1 G PO CAPS
1.0000 g | ORAL_CAPSULE | Freq: Every day | ORAL | Status: DC
  Administered 2016-03-29: 1 g via ORAL
  Filled 2016-03-28: qty 1

## 2016-03-28 MED ORDER — GABAPENTIN 100 MG PO CAPS
100.0000 mg | ORAL_CAPSULE | Freq: Three times a day (TID) | ORAL | Status: DC
  Administered 2016-03-28 – 2016-03-29 (×2): 100 mg via ORAL
  Filled 2016-03-28 (×2): qty 1

## 2016-03-28 MED ORDER — MIDAZOLAM HCL 2 MG/2ML IJ SOLN
INTRAMUSCULAR | Status: DC | PRN
  Administered 2016-03-28: 1 mg via INTRAVENOUS

## 2016-03-28 MED ORDER — LIDOCAINE HCL (PF) 1 % IJ SOLN
INTRAMUSCULAR | Status: DC | PRN
  Administered 2016-03-28: 3 mL

## 2016-03-28 MED ORDER — FENTANYL CITRATE (PF) 100 MCG/2ML IJ SOLN
INTRAMUSCULAR | Status: AC
Start: 2016-03-28 — End: 2016-03-28
  Filled 2016-03-28: qty 2

## 2016-03-28 SURGICAL SUPPLY — 10 items
CATH INFINITI 5 FR IM (CATHETERS) ×1 IMPLANT
CATH INFINITI 5FR MULTPACK ANG (CATHETERS) ×1 IMPLANT
COVER DOME SNAP 22 D (MISCELLANEOUS) ×1 IMPLANT
DEVICE RAD COMP TR BAND LRG (VASCULAR PRODUCTS) ×1 IMPLANT
GLIDESHEATH SLEND A-KIT 6F 22G (SHEATH) ×1 IMPLANT
KIT HEART LEFT (KITS) ×2 IMPLANT
PACK CARDIAC CATHETERIZATION (CUSTOM PROCEDURE TRAY) ×2 IMPLANT
TRANSDUCER W/STOPCOCK (MISCELLANEOUS) ×2 IMPLANT
TUBING CIL FLEX 10 FLL-RA (TUBING) ×2 IMPLANT
WIRE SAFE-T 1.5MM-J .035X260CM (WIRE) ×1 IMPLANT

## 2016-03-28 NOTE — ED Notes (Signed)
Pt states pain free after second nitro.

## 2016-03-28 NOTE — Progress Notes (Signed)
TR BAND REMOVAL  LOCATION:    left radial  DEFLATED PER PROTOCOL:    Yes.    TIME BAND OFF / DRESSING APPLIED:    22:00   SITE UPON ARRIVAL:    Level 0  SITE AFTER BAND REMOVAL:    Level 0  CIRCULATION SENSATION AND MOVEMENT:    Within Normal Limits   Yes.    COMMENTS:   Post TR band instructions given. Pt tolerated well.

## 2016-03-28 NOTE — ED Notes (Signed)
Pt states chest pain improved to 1/10. States headache improved.

## 2016-03-28 NOTE — ED Notes (Signed)
Patient taken to Cath Lab 1.  Belongings were left with friend in the waiting room.  Informed consent has not been signed, Cath Lab team aware of this situation.

## 2016-03-28 NOTE — ED Provider Notes (Signed)
[facility] DEPT Provider Note   CSN: [id number] Arrival date & time: 03/28/16  [id number]     History   Chief Complaint Chief Complaint  Patient presents with  . Chest Pain    HPI Hunter Townsend is a 75 y.o. male with a past medical history significant for coronary artery disease status post CABG and stenting and hypertension who presents with chest pain. Patient reports that he was feeling normal last night however upon waking this morning, had pressure-like chest pain. He describes it as gradually worsening throughout the morning, a 7 out of 10 in severity at its worst. He says it feels "like my last heart attack". He describes associated shortness of breath, lightheadedness, diaphoresis, nausea, pallor. He says it is exertional but it is not pleuritic. He says he has not taken any new medications however he did have his isosorbide increased recently by his cardiology team. Patient denies any recent chest trauma. He denies any radiation of his discomfort is located in his central chest. He denies any fevers, chills, cough, constipation, diarrhea or dysuria. She reports mild tingling of the right arm.     The history is provided by the patient and medical records. No language interpreter was used.  Chest Pain   This is a recurrent problem. The current episode started 3 to 5 hours ago. The problem occurs constantly. The problem has been gradually worsening. The pain is associated with exertion. The pain is present in the substernal region. The pain is at a severity of 7/10. The pain is moderate. The quality of the pain is described as exertional and pressure-like. The pain does not radiate. The symptoms are aggravated by exertion. Associated symptoms include diaphoresis, exertional chest pressure, malaise/fatigue, nausea and shortness of breath. Pertinent negatives include no abdominal pain, no back pain, no cough, no dizziness, no fever, no headaches, no lower extremity edema, no  palpitations, no syncope, no vomiting and no weakness. He has tried nitroglycerin for the symptoms. The treatment provided moderate relief. Risk factors include male gender.  His past medical history is significant for CAD.  Procedure history is positive for cardiac catheterization.    Past Medical History:  Diagnosis Date  . Arthritis   . BPH (benign prostatic hyperplasia)   . Coronary atherosclerosis of native coronary artery    Multivessel, NSTEMI  s/p PTCA/DES to mid LAD 06/24/11,, known patent LIMA-LAD and occlusion of all vein grafts  . Hypertension   . Ischemic cardiomyopathy    EF 35-40% by cath 06/24/11 (EF 37% by nuclear stress test 06/2010).  Marland Kitchen PAD (peripheral artery disease) (HCC)    Status post left SFA stent - Dr. Burt Knack    Patient Active Problem List   Diagnosis Date Noted  . Peripheral arterial disease ([place]) 05/17/2011  . Essential hypertension, benign 04/05/2011  . Claudication ([place]) 09/05/2010  . Ischemic cardiomyopathy 08/14/2010  . Coronary atherosclerosis of native coronary artery     Past Surgical History:  Procedure Laterality Date  . BACK SURGERY    . CORONARY ARTERY BYPASS GRAFT  1987   [place], [place] - LIMA to LAD, SVG to RCA presumably  . LEFT HEART CATHETERIZATION WITH CORONARY/GRAFT ANGIOGRAM  06/24/2011   Procedure: LEFT HEART CATHETERIZATION WITH Beatrix Fetters;  Surgeon: Burnell Blanks, MD;  Location: [facility] CATH LAB;  Service: Cardiovascular;;  . LEFT HEART CATHETERIZATION WITH CORONARY/GRAFT ANGIOGRAM N/A 02/26/2013   Procedure: LEFT HEART CATHETERIZATION WITH Beatrix Fetters;  Surgeon: Blane Ohara, MD;  Location: [facility] CATH LAB;  Service: Cardiovascular;  Laterality: N/A;  . LOWER EXTREMITY ANGIOGRAM N/A 10/30/2011   Procedure: LOWER EXTREMITY ANGIOGRAM;  Surgeon: Sherren Mocha, MD;  Location: [facility] CATH LAB;  Service: Cardiovascular;  Laterality: N/A;  . PERCUTANEOUS CORONARY STENT INTERVENTION (PCI-S)  02/26/2013   Procedure:  PERCUTANEOUS CORONARY STENT INTERVENTION (PCI-S);  Surgeon: Blane Ohara, MD;  Location: [facility] CATH LAB;  Service: Cardiovascular;;       Home Medications    Prior to Admission medications   Medication Sig Start Date End Date Taking? Authorizing Provider  aspirin EC 81 MG tablet Take 81 mg by mouth daily.    Historical Provider, MD  carvedilol (COREG) 12.5 MG tablet Take 1 tablet (12.5 mg total) by mouth 2 (two) times daily with a meal. 11/16/15   Satira Sark, MD  clopidogrel (PLAVIX) 75 MG tablet TAKE 1 TABLET BY MOUTH ONCE DAILY. PATIENT NEEDS TO BE SEEN. 12/01/15   Satira Sark, MD  gabapentin (NEURONTIN) 100 MG capsule Take 100 mg by mouth 3 (three) times daily.     Historical Provider, MD  isosorbide mononitrate (IMDUR) 30 MG 24 hr tablet Take 30 mg by mouth 2 (two) times daily.     Historical Provider, MD  LORazepam (ATIVAN) 1 MG tablet Take 1 mg by mouth 2 (two) times daily as needed for anxiety (sleep).     Historical Provider, MD  losartan (COZAAR) 50 MG tablet Take 50 mg by mouth daily.    Historical Provider, MD  Multiple Vitamin (MULTIVITAMIN) tablet Take 1 tablet by mouth daily.    Historical Provider, MD  nitroGLYCERIN (NITROSTAT) 0.4 MG SL tablet Place 1 tablet (0.4 mg total) under the tongue every 5 (five) minutes as needed. For chest pain 11/04/12   Satira Sark, MD  Omega-3 Fatty Acids (FISH OIL) 1000 MG CAPS Take 2,000 mg by mouth daily.    Historical Provider, MD  pantoprazole (PROTONIX) 40 MG tablet Take 40 mg by mouth daily.  04/01/11   Historical Provider, MD  ranolazine (RANEXA) 500 MG 12 hr tablet Take 1 tablet (500 mg total) by mouth 2 (two) times daily. 03/04/16   Satira Sark, MD    Family History Family History  Problem Relation Age of Onset  . CAD    . Hypertension      Social History Social History  Substance Use Topics  . Smoking status: Former Smoker    Packs/day: 2.00    Years: 40.00    Types: Cigarettes    Quit date: 05/27/1993    . Smokeless tobacco: Never Used  . Alcohol use No     Allergies   Review of patient's allergies indicates no known allergies.   Review of Systems Review of Systems  Constitutional: Positive for diaphoresis and malaise/fatigue. Negative for activity change, chills, fatigue and fever.  HENT: Negative for congestion and rhinorrhea.   Eyes: Negative for visual disturbance.  Respiratory: Positive for shortness of breath. Negative for cough, chest tightness, wheezing and stridor.   Cardiovascular: Positive for chest pain. Negative for palpitations, leg swelling and syncope.  Gastrointestinal: Positive for nausea. Negative for abdominal distention, abdominal pain, blood in stool, constipation, diarrhea and vomiting.  Genitourinary: Negative for difficulty urinating, dysuria, flank pain and frequency.  Musculoskeletal: Negative for back pain and gait problem.  Skin: Negative for rash and wound.  Neurological: Positive for light-headedness. Negative for dizziness, syncope, weakness and headaches.  Psychiatric/Behavioral: Negative for agitation and confusion.  All other systems reviewed and are negative.  Physical Exam Updated Vital Signs BP (!) 180/102 (BP Location: Left Arm) Comment: Simultaneous filing. User may not have seen previous data. Comment (BP Location): Simultaneous filing. User may not have seen previous data.  Pulse 82 Comment: Simultaneous filing. User may not have seen previous data.  Temp 97.7 F (36.5 C) (Oral) Comment: Simultaneous filing. User may not have seen previous data. Comment (Src): Simultaneous filing. User may not have seen previous data.  Resp 16 Comment: Simultaneous filing. User may not have seen previous data.  Ht 5\' 5"  (1.651 m)   Wt 176 lb (79.8 kg)   SpO2 94% Comment: Simultaneous filing. User may not have seen previous data.  BMI 29.29 kg/m   Physical Exam  Constitutional: He is oriented to person, place, and time. He appears well-developed and  well-nourished. No distress.  HENT:  Head: Normocephalic and atraumatic.  Mouth/Throat: Oropharynx is clear and moist. No oropharyngeal exudate.  Eyes: Conjunctivae and EOM are normal. Pupils are equal, round, and reactive to light.  Neck: Normal range of motion. Neck supple.  Cardiovascular: Normal rate, regular rhythm and intact distal pulses.   Pulmonary/Chest: Effort normal and breath sounds normal. No stridor. No respiratory distress. He has no wheezes. He exhibits no tenderness.  Abdominal: Soft. There is no tenderness.  Musculoskeletal: He exhibits no edema or tenderness.  Neurological: He is alert and oriented to person, place, and time. He has normal reflexes. He displays normal reflexes. No cranial nerve deficit. He exhibits normal muscle tone. Coordination normal.  Skin: Skin is warm. Capillary refill takes less than 2 seconds. He is diaphoretic.  Psychiatric: He has a normal mood and affect.  Nursing note and vitals reviewed.    ED Treatments / Results  Labs (all labs ordered are listed, but only abnormal results are displayed) Labs Reviewed  COMPREHENSIVE METABOLIC PANEL - Abnormal; Notable for the following:       Result Value   Glucose, Bld 110 (*)    Total Bilirubin 1.7 (*)    All other components within normal limits  BRAIN NATRIURETIC PEPTIDE - Abnormal; Notable for the following:    B Natriuretic Peptide 217.1 (*)    All other components within normal limits  CBC  TROPONIN I  TROPONIN I  TROPONIN I  I-STAT TROPOININ, ED  I-STAT TROPOININ, ED  Randolm Idol, ED    EKG  EKG Interpretation  Date/Time:  Thursday March 28 2016 10:03:22 EDT Ventricular Rate:  79 PR Interval:    QRS Duration: 110 QT Interval:  396 QTC Calculation: 454 R Axis:   82 Text Interpretation:  Sinus rhythm Borderline right axis deviation Less artifact than prior ECG today NO STEMI Similar T wave inversion on  AVL from prior Confirmed by [employer] MD, [place] [phone]) on  03/28/2016 10:09:17 AM       Radiology Dg Chest Portable 1 View  Result Date: 03/28/2016 CLINICAL DATA:  Chest pain EXAM: PORTABLE CHEST 1 VIEW COMPARISON:  05/17/2015 FINDINGS: Borderline cardiomegaly. Status post CABG. No infiltrate or pleural effusion. No pulmonary edema. IMPRESSION: Borderline cardiomegaly.  Status post CABG.  No active disease. Electronically Signed   By: Lahoma Crocker M.D.   On: 03/28/2016 10:26    Procedures Procedures (including critical care time)  CRITICAL CARE Performed by: Gwenyth Allegra Davinder Haff Total critical care time: 30 minutes Critical care time was exclusive of separately billable procedures and treating other patients. Critical care was necessary to treat or prevent imminent or life-threatening deterioration. Critical care was time spent personally  by me on the following activities: development of treatment plan with patient and/or surrogate as well as nursing, discussions with consultants, evaluation of patient's response to treatment, examination of patient, obtaining history from patient or surrogate, ordering and performing treatments and interventions, ordering and review of laboratory studies, ordering and review of radiographic studies, pulse oximetry and re-evaluation of patient's condition.    Medications Ordered in ED Medications  sodium chloride 0.9 % bolus 1,000 mL (0 mLs Intravenous Stopped 03/28/16 1225)    And  0.9 %  sodium chloride infusion (125 mL/hr Intravenous New Bag/Given 03/28/16 1226)  LORazepam (ATIVAN) tablet 1 mg (not administered)  pantoprazole (PROTONIX) EC tablet 40 mg (not administered)  aspirin EC tablet 81 mg (not administered)  nitroGLYCERIN (NITROSTAT) SL tablet 0.4 mg (not administered)  losartan (COZAAR) tablet 25 mg (not administered)  gabapentin (NEURONTIN) capsule 100 mg (not administered)  multivitamin with minerals tablet 1 tablet (not administered)  omega-3 acid ethyl esters (LOVAZA) capsule 1 g (not  administered)  clopidogrel (PLAVIX) tablet 75 mg (not administered)  ranolazine (RANEXA) 12 hr tablet 500 mg (not administered)  carvedilol (COREG) tablet 18.75 mg (not administered)  sodium chloride flush (NS) 0.9 % injection 3 mL (not administered)  sodium chloride flush (NS) 0.9 % injection 3 mL (not administered)  0.9 %  sodium chloride infusion (not administered)  0.9 %  sodium chloride infusion (not administered)  furosemide (LASIX) injection 40 mg (not administered)  isosorbide mononitrate (IMDUR) 24 hr tablet 30 mg (not administered)  nitroGLYCERIN (NITROSTAT) SL tablet 0.4 mg (0.4 mg Sublingual Given 03/28/16 1119)  ondansetron (ZOFRAN) injection 4 mg (4 mg Intravenous Given 03/28/16 1237)     Initial Impression / Assessment and Plan / ED Course  I have reviewed the triage vital signs and the nursing notes.  Pertinent labs & imaging results that were available during my care of the patient were reviewed by me and considered in my medical decision making (see chart for details).  Clinical Course    Hunter Townsend is a 75 y.o. male with a past medical history significant for coronary artery disease status post CABG and stenting and hypertension who presents with chest pain.  History and exam are seen above.  On exam, patient's lungs are clear, pulses are symmetric bilaterally, and chest is nontender. Abdomen is nontender. No evidence of bilateral or unilateral swelling or edema or tenderness of the leg. Neuro exam intact. She does report mild tingling of the right arm but no focal deficits on exam.  EKG was performed and had artifact initially. This was repeated. EKG showed no evidence of acute ST elevation MI however, there was questionable elevation in 2, 3, and aVF. These will be observed.  Patient already took aspirin at home and with EMS. Patient took one nitroglycerin with improvement to his chest pain to a 3 out of 10. Patient will have 2 more doses ordered for  discomfort. Patient will have lab for testing chest x-ray, and will likely require admission or further management of his high risk chest pain.  Troponin negative, CXR unremarkable side from cardiomegaly.   PT have improvement in CP but had intermittent continued nausea. Zofran ordered.   PT evaluated by Cards and will be taken to the cath lab for heart catheterization and to look for blockages due to concerning presentation.   Pt taken to cath lab without complication.    Final Clinical Impressions(s) / ED Diagnoses   Final diagnoses:  Chest pain, unspecified type  Clinical Impression: 1. Chest pain, unspecified type     Disposition: Admit to Cardiology after Cath Lab    Courtney Paris, MD 03/28/16 1944

## 2016-03-28 NOTE — ED Triage Notes (Signed)
Pt arrives [facility] with [facility]/o chest pressure, dizziness, SHOB, and diaphoresis after rising and moving around this am.Given nitro x 1, ASA 325x2 at home. Pain 7/10 at worse and 3/10 by arrival.

## 2016-03-28 NOTE — Interval H&P Note (Signed)
History and Physical Interval Note:  03/28/2016 4:57 PM  Gillermina Hu  has presented today for surgery, with the diagnosis of cp- concerning for unstable angina  The various methods of treatment have been discussed with the patient and family. After consideration of risks, benefits and other options for treatment, the patient has consented to  Procedure(s): Left Heart Cath and Cors/Grafts Angiography (N/A) With Possible Percutaneous Coronary Intervention as a surgical intervention .  The patient's history has been reviewed, patient examined, no change in status, stable for surgery.  I have reviewed the patient's chart and labs.  Questions were answered to the patient's satisfaction.    Cath Lab Visit (complete for each Cath Lab visit)  Clinical Evaluation Leading to the Procedure:   ACS: Yes.    Non-ACS:    Anginal Classification: CCS IV  Anti-ischemic medical therapy: Maximal Therapy (2 or more classes of medications)  Non-Invasive Test Results: No non-invasive testing performed  Prior CABG: Previous CABG    Glenetta Hew

## 2016-03-28 NOTE — ED Notes (Signed)
Pt states nausea has resolved. A&O x 3 maew resp even and non labored. Skin warm and dry.

## 2016-03-28 NOTE — ED Notes (Signed)
Pt state  Nausea has improved but some remains after medication, "I feel lousy"

## 2016-03-28 NOTE — Progress Notes (Signed)
[facility] for heparin Indication: chest pain/ACS  No Known Allergies  Patient Measurements: Height: 5\' 5"  (165.1 cm) Weight: 176 lb (79.8 kg) IBW/kg (Calculated) : 61.5 Heparin Dosing Weight: 78  Vital Signs: Temp: 97.7 F (36.5 C) (11/02 1001) Temp Source: Oral (11/02 1001) BP: 166/98 (11/02 1530) Pulse Rate: 79 (11/02 1530)  Labs:  Recent Labs  03/28/16 1012  HGB 14.1  HCT 40.7  PLT 268  CREATININE 1.03    Estimated Creatinine Clearance: 58.4 mL/min (by C-G formula based on SCr of 1.03 mg/dL).   Medical History: Past Medical History:  Diagnosis Date  . Arthritis   . BPH (benign prostatic hyperplasia)   . Coronary atherosclerosis of native coronary artery    Multivessel, NSTEMI  s/p PTCA/DES to mid LAD 06/24/11,, known patent LIMA-LAD and occlusion of all vein grafts  . Hypertension   . Ischemic cardiomyopathy    EF 35-40% by cath 06/24/11 (EF 37% by nuclear stress test 06/2010).  Marland Kitchen PAD (peripheral artery disease) (HCC)    Status post left SFA stent - Dr. Burt Knack    Assessment: 75 yo male admitted with chest pain. Pharmacy to assist with heparin dosing for ACS/NSTEMI. No known anticoagulation PTA. CBC stable.    Goal of Therapy:  Heparin level 0.3-0.7 units/ml Monitor platelets by anticoagulation protocol: Yes   Plan:  1. Give 4000 units bolus x 1 2. Start heparin infusion at 950 units/hr 3. Check anti-Xa level in 8 hours and daily while on heparin 4. Continue to monitor H&H and platelets   Vincenza Hews, PharmD, BCPS 03/28/2016, 4:26 PM Pager: [phone]

## 2016-03-28 NOTE — ED Notes (Signed)
Pt c/o chest pressure. 2/10 after pain free on arrival.

## 2016-03-28 NOTE — ED Notes (Signed)
Pt c/o nausea. Dr. Gustavus Messing aware.

## 2016-03-28 NOTE — H&P (Signed)
Cardiology Consult    Patient ID: IBHAN Hunter Townsend MRN: [id number], DOB/AGE: [birth year]   Admit date: 03/28/2016 Date of Consult: 03/28/2016  Primary Physician: Rory Percy, MD  Primary Cardiologist: [name]   Patient Profile    [name] is a 75 year old male with a past medical history of CAD s/p CABG in 1987, most recent heart cath in October 2014 that showed occlusion of all SVG grafts, got DES to LIMA to LAD. Presents with chest pain, diaphoresis and nausea.   History of Present Illness    [name] woke up this morning at his usual time and went to get out of bed and became quite dizzy. He continued to walk into the kitchen to feed his cat when he became profusely diaphoretic and started having substernal chest pain. He took a 325mg  ASA and one SL Nitro. This helped his pain somewhat, but he did not feel well at all and decided to call EMS. He was told by the 911 operator to take an additional 35 mg ASA, and he did so. EMS arrived and he was still having 7/10 chest pain with nausea and diaphoresis. Denies SOB.   Upon arrival to the ED, EKG showed NSR with some slight ST elevation in the inferior leads. Troponin negative x 2.   During my encounter he continues to have chest pain.   His last heart cath was in 2014 with Dr. Burt Knack. Cath was done in the setting of unstable angina. His grafts were all occluded, and his LIMA to LAD had 99% stenosis that was successfully treated with a 2.75 x 7mm Xience drug eluding stent. He tells me that his angina today is exactly like his angina in 2014 prior to his cath. He reported increased SOB and angina having to use his SL Nitro more often to [name] at his last office visit in August of this year. A nuclear stress test was ordered and showed no ischemia, scar was consistent with his known disease. [name] stopped the patient's isosorbide and started Ranexa. Echo was ordered as well and showed EF of 40-45% with akinesis of  the mid anterior,mid anteroseptal, basal inferoseptal, and mid anterolateral myocardium; severe hypokinesis of the basal anteroseptal myocardium; moderate hypokinesis of the apical septal myocardium, mild hypokinesis of the mid inferoseptal and apical myocardium.    Past Medical History   Past Medical History:  Diagnosis Date  . Arthritis   . BPH (benign prostatic hyperplasia)   . Coronary atherosclerosis of native coronary artery    Multivessel, NSTEMI  s/p PTCA/DES to mid LAD 06/24/11,, known patent LIMA-LAD and occlusion of all vein grafts  . Hypertension   . Ischemic cardiomyopathy    EF 35-40% by cath 06/24/11 (EF 37% by nuclear stress test 06/2010).  Marland Kitchen PAD (peripheral artery disease) (HCC)    Status post left SFA stent - Dr. Burt Knack    Past Surgical History:  Procedure Laterality Date  . BACK SURGERY    . CORONARY ARTERY BYPASS GRAFT  1987   [place], [place] - LIMA to LAD, SVG to RCA presumably  . LEFT HEART CATHETERIZATION WITH CORONARY/GRAFT ANGIOGRAM  06/24/2011   Procedure: LEFT HEART CATHETERIZATION WITH Beatrix Fetters;  Surgeon: Burnell Blanks, MD;  Location: [facility] CATH LAB;  Service: Cardiovascular;;  . LEFT HEART CATHETERIZATION WITH CORONARY/GRAFT ANGIOGRAM N/A 02/26/2013   Procedure: LEFT HEART CATHETERIZATION WITH Beatrix Fetters;  Surgeon: Blane Ohara, MD;  Location: [facility] CATH LAB;  Service: Cardiovascular;  Laterality:  N/A;  . LOWER EXTREMITY ANGIOGRAM N/A 10/30/2011   Procedure: LOWER EXTREMITY ANGIOGRAM;  Surgeon: Sherren Mocha, MD;  Location: [facility] CATH LAB;  Service: Cardiovascular;  Laterality: N/A;  . PERCUTANEOUS CORONARY STENT INTERVENTION (PCI-S)  02/26/2013   Procedure: PERCUTANEOUS CORONARY STENT INTERVENTION (PCI-S);  Surgeon: Blane Ohara, MD;  Location: [facility] CATH LAB;  Service: Cardiovascular;;     Allergies  No Known Allergies  Inpatient Medications    . heparin  4,000 Units Intravenous Once    Family History    Family  History  Problem Relation Age of Onset  . CAD      pt. says he is unsure of the relationship  . Hypertension      Social History    Social History   Social History  . Marital status: Single    Spouse name: N/A  . Number of children: N/A  . Years of education: N/A   Occupational History  . Retired     Orthoptist   Social History Main Topics  . Smoking status: Former Smoker    Packs/day: 2.00    Years: 40.00    Types: Cigarettes    Quit date: 05/27/1993  . Smokeless tobacco: Never Used  . Alcohol use No  . Drug use: No  . Sexual activity: Not Currently   Other Topics Concern  . Not on file   Social History Narrative  . No narrative on file     Review of Systems    General:  No chills, fever, night sweats or weight changes.  Cardiovascular:  + chest pain, + dyspnea on exertion, edema, orthopnea, palpitations, paroxysmal nocturnal dyspnea. Dermatological: No rash, lesions/masses Respiratory: No cough, dyspnea Urologic: No hematuria, dysuria Abdominal:   No nausea, vomiting, diarrhea, bright red blood per rectum, melena, or hematemesis Neurologic:  No visual changes, wkns, changes in mental status. All other systems reviewed and are otherwise negative except as noted above.  Physical Exam    Blood pressure 166/98, pulse 79, temperature 97.7 F (36.5 C), temperature source Oral, resp. rate 16, height 5\' 5"  (1.651 m), weight 176 lb (79.8 kg), SpO2 98 %.  General: Pleasant, NAD Psych: Normal affect. Neuro: Alert and oriented X 3. Moves all extremities spontaneously. HEENT: Normal  Neck: Supple without bruits or JVD. Lungs:  Resp regular and unlabored, CTA. Heart: RRR no s3, s4, or murmurs. Abdomen: Soft, non-tender, non-distended, BS + x 4.  Extremities: No clubbing, cyanosis or edema. DP/PT/Radials 2+ and equal bilaterally.  Labs    Troponin [facility] of Care Test)  Recent Labs  03/28/16 1319  TROPIPOC 0.01   No results for input(s): CKTOTAL, CKMB, TROPONINI  in the last 72 hours. Lab Results  Component Value Date   WBC 7.5 03/28/2016   HGB 14.1 03/28/2016   HCT 40.7 03/28/2016   MCV 91.1 03/28/2016   PLT 268 03/28/2016     Recent Labs Lab 03/28/16 1012  NA 138  K 4.2  CL 106  CO2 25  BUN 8  CREATININE 1.03  CALCIUM 9.4  PROT 7.2  BILITOT 1.7*  ALKPHOS 99  ALT 17  AST 22  GLUCOSE 110*    Radiology Studies    Dg Chest Portable 1 View  Result Date: 03/28/2016 CLINICAL DATA:  Chest pain EXAM: PORTABLE CHEST 1 VIEW COMPARISON:  05/17/2015 FINDINGS: Borderline cardiomegaly. Status post CABG. No infiltrate or pleural effusion. No pulmonary edema. IMPRESSION: Borderline cardiomegaly.  Status post CABG.  No active disease. Electronically Signed   By:  Lahoma Crocker M.D.   On: 03/28/2016 10:26    EKG & Cardiac Imaging    EKG: NSR  Echocardiogram: 02/28/16 Study Conclusions  - Left ventricle: The cavity size was at the upper limits of   normal. Wall thickness was normal. Systolic function appeared   mildly reduced in some views and moderately reduced in others. In   summation, the estimated ejection fraction was approximately 45%.   Left ventricular diastolic function parameters were normal. - Regional wall motion abnormality: Akinesis of the mid anterior,   mid anteroseptal, basal inferoseptal, and mid anterolateral   myocardium; severe hypokinesis of the basal anteroseptal   myocardium; moderate hypokinesis of the apical septal myocardium;   mild hypokinesis of the mid inferoseptal and apical myocardium. - Aortic valve: Trileaflet; mildly thickened, mildly calcified   leaflets. There was trivial regurgitation. - Mitral valve: There was moderate regurgitation. - Left atrium: The atrium was mildly dilated. - Right atrium: The atrium was mildly dilated. - Tricuspid valve: There was mild to moderate eccentric   regurgitation. - Pulmonary arteries: PA peak pressure: 37 mm Hg (S).   Assessment & Plan    1. Unstable angina:  Presents with chest pain that began upon awakening this morning, pain was reminiscent of his previous angina prior to stenting to his LIMA to LAD. Pain was associated with profuse diaphoresis and nausea. He is still having chest pressure and feels poorly at the time of my encounter. EKG shows some very minimal ST elevation in the inferior leads, troponin is negative x 2. We will proceed with heart cath to evalaute further.   He has taken 650 mg of ASA so far today. Will start heparin gtt.   2. History of CAD s/p CABG in 1987: On chronic Plavix and 81mg  ASA. Recently isosorbide was discontinued and Ranexa was started.   3. HLD: LDL in 2015 was 101. He is not on a statin. Takes fish oil only. Would add high intensity statin.   4. HTN: Well controlled on Coreg, Losartan.   Signed, Arbutus Leas, NP 03/28/2016, 4:14 PM Pager: [phone]  Pt seen and examined   Agree with findings as noted above by e smith.  Pt is a 75 yo with known CAD  PResents with CP/SOB/diaphoresis  REcent increase DOE  WOrrisome for progressive CAD  On exam lungs CTA  Cardiac rrrr  No S3  Ext without edema   Reviewed with pt  I would recomm L heart cath to define anatomy  INtervention if needed.  Pt understands and agrees to proceed. Dorris Carnes

## 2016-03-29 ENCOUNTER — Encounter (HOSPITAL_COMMUNITY): Payer: Self-pay | Admitting: Cardiology

## 2016-03-29 LAB — TROPONIN I: TROPONIN I: 0.79 ng/mL — AB (ref <0.03)

## 2016-03-29 MED ORDER — FUROSEMIDE 10 MG/ML IJ SOLN
40.0000 mg | Freq: Once | INTRAMUSCULAR | Status: AC
  Administered 2016-03-29: 09:00:00 40 mg via INTRAVENOUS
  Filled 2016-03-29: qty 4

## 2016-03-29 MED ORDER — POTASSIUM CHLORIDE CRYS ER 20 MEQ PO TBCR: 40.0000 meq | EXTENDED_RELEASE_TABLET | Freq: Once | ORAL | Status: DC

## 2016-03-29 MED ORDER — ATORVASTATIN CALCIUM 40 MG PO TABS: 40.0000 mg | ORAL_TABLET | Freq: Every day | ORAL | Status: DC

## 2016-03-29 MED ORDER — LOSARTAN POTASSIUM 50 MG PO TABS: 50.0000 mg | ORAL_TABLET | Freq: Every day | ORAL | 5 refills | Status: DC

## 2016-03-29 MED ORDER — POTASSIUM CHLORIDE ER 10 MEQ PO TBCR: 10.0000 meq | EXTENDED_RELEASE_TABLET | Freq: Every day | ORAL | 5 refills | Status: DC

## 2016-03-29 MED ORDER — LOSARTAN POTASSIUM 50 MG PO TABS
50.0000 mg | ORAL_TABLET | Freq: Every day | ORAL | Status: DC
  Administered 2016-03-29: 09:00:00 50 mg via ORAL
  Filled 2016-03-29: qty 1

## 2016-03-29 MED ORDER — FUROSEMIDE 10 MG/ML IJ SOLN: 20.0000 mg | Freq: Once | INTRAMUSCULAR | Status: DC

## 2016-03-29 MED ORDER — ISOSORBIDE MONONITRATE ER 30 MG PO TB24: 30.0000 mg | ORAL_TABLET | Freq: Every day | ORAL | 5 refills | Status: DC

## 2016-03-29 MED ORDER — POTASSIUM CHLORIDE CRYS ER 20 MEQ PO TBCR
20.0000 meq | EXTENDED_RELEASE_TABLET | Freq: Once | ORAL | Status: AC
  Administered 2016-03-29: 20 meq via ORAL
  Filled 2016-03-29: qty 1

## 2016-03-29 MED ORDER — FUROSEMIDE 40 MG PO TABS: 40.0000 mg | ORAL_TABLET | Freq: Every day | ORAL | 5 refills | Status: DC

## 2016-03-29 MED ORDER — CARVEDILOL 12.5 MG PO TABS: 18.7500 mg | ORAL_TABLET | Freq: Two times a day (BID) | ORAL | 5 refills | Status: DC

## 2016-03-29 MED ORDER — ATORVASTATIN CALCIUM 40 MG PO TABS: 40.0000 mg | ORAL_TABLET | Freq: Every day | ORAL | 5 refills | Status: DC

## 2016-03-29 NOTE — Consult Note (Signed)
            [facility] Primary Care Navigator  03/29/2016  Hunter Townsend [birth year] [id number]   Patient seen at the bedside to identifypossible discharge needs.  Patient states feeling light-headedness, cold/ clammy sweats and shortness of breath that led to this admission. Discharge planis home as stated by patient.  Patient confirms that primary care provider is Dr. Ernest Mallick [facility]).  Patient lives alone, independent with self care and able to drive prior to admission.  Sister-in law Stanton Kidney P.) and friend Wille Glaser) will be able to provide transportation to hisdoctors'appointments as stated.  His good friend Rise Paganini) who lives close by will be his primary caregiver at home. Patient states he has nephews and nieces who can assist with his care as well. He reports having "good family support".  Patient states using [facility] in [place] to obtain medications with nodifficulty.  Patient is independent with medication management at home using "pill box"system.  Patient expressed understanding to call primary care provider's office once discharged, for a post discharge follow-up appointment within a week or sooner if needs arise.  Patient letter provided as a reminder.  Patient deniesany needs or concerns at this time   For additional questions please contact:  Edwena Felty A. Ramzi Brathwaite, BSN, RN-BC [facility] PRIMARY CARE Navigator Cell: [phone]

## 2016-03-29 NOTE — Discharge Summary (Signed)
Discharge Summary    Patient ID: Hunter Townsend,  MRN: [id number], DOB/AGE: 01-19-1941 75 y.o.  Admit date: 03/28/2016 Discharge date: 03/29/2016  Primary Care Provider: Rory Percy Primary Cardiologist: [name]   Discharge Diagnoses    Active Problems:   CAD S/P percutaneous coronary angioplasty   Hx of CABG   Accelerated hypertension with NYHA class 4 systolic heart failure (HCC)   Allergies No Known Allergies  Diagnostic Studies/Procedures    Procedures   Left Heart Cath and Cors/Grafts Angiography  Conclusion     Suspect Accelerated Hypertension with Acute on Chronic Combined Systolic and Diastolic Heart Failure As the Main Etiology for His Anginal Pain.  ____________________________  Colon Flattery RCA to Mid RCA lesion, 100 %stenosed.  Ost LAD to Mid LAD lesion, 100 %stenosed. Stented segment beyond graft insertion widely patent mild disease distally  Known occlusion of all 3 vein grafts. Not visualized.  Prox Cx lesion, 45 %stenosed. 2nd Mrg-1 lesion, 75 %stenosed. 2nd Mrg-2 lesion, 60 %stenosed. -- Angiographically maybe mild progression of disease since last catheterization  LIMA-LAD and is normal in caliber and large. Dist Graft stent, 40 percent in-stent restenosis  There is severe left ventricular systolic dysfunction. The left ventricular ejection fraction is less than 25% by visual estimate.  LV end diastolic pressure is severely elevated - 36 mmHg  There is mild (2+) mitral regurgitation.  Accelerated Hypertension with Acute on Chronic Combined Systolic and Diastolic Heart Failure. -- Severely reduced LVEF with severely elevated LVEDP  Angiographically relatively stable coronary artery disease from last PCI of the LAD with moderate in-stent restenosis of the LIMA-LAD DES stent. Otherwise mild progression of disease in the circumflex system. Known occlusion of all vein grafts. The RCA fills via collaterals from the Circumflex episode which  itself appears to be codominant. Stable diffuse disease in the Circumflex.      History of Present Illness     Hunter Townsend is a 75 year old male, followed in [place] by [name], with a past medical history of CAD s/p CABG in 1987, most recent heart cath in October 2014 that showed occlusion of all SVG grafts, got DES to LIMA to LAD.   He presented to [facility] on 03/28/16 with chest pain concerning for unstable angina. Also with nausea + vomiting. Initial troponin was negative. He was admitted to telemetry for further w/u.   Hospital Course   Patient was admitted and cardiac enzymes were cycled x 3. Initial troponin was negative however 2nd troponin was 0.11 and 3rd troponin was 0.79. He was referred for [facility]. This showed angiographically relatively stable coronary artery disease from last PCI of the LAD with moderate in-stent restenosis of the LIMA-LAD DES stent. Otherwise mild progression of disease in the circumflex system. Known occlusion of all vein grafts. The RCA fills via collaterals from the Circumflex episode which itself appears to be codominant. Stable diffuse disease in the Circumflex. LV end diastolic pressure was severely elevated at 36 mmHg. It was suspected that accelerated hypertension with acute on chronic combined systolic and diastolic HF was the main etiology for his anginal pain. He left the cath lab in stable condition. He was restarted on Imdur 30 mg. His Coreg was increased to 18.75 mg BID. Losartan was also increased to 50 mg for better BP control. He was placed on IV lasix for diuresis. He was monitored and had good UOP. He diuresed an additional 1.3L prior to discharge. BP improved to 123/74. He denied recurrent CP  and no dyspnea. He ambulated with cardiac rehab w/o difficulty. He was last seen and examined by Dr. Harrington Challenger, who determined he was stable for d/c home. He will w/u in [place] with [name]. He will need an outpatient 2D echo and f/u BMP in 5 days. He was given a paper  order to get BMP drawn on 04/03/16.   Consultants: none    Discharge Vitals Blood pressure 123/74, pulse 93, temperature 97.7 F (36.5 C), temperature source Oral, resp. rate 20, height 5\' 5"  (1.651 m), weight 179 lb 3.7 oz (81.3 kg), SpO2 93 %.  Filed Weights   03/28/16 1003 03/29/16 0423  Weight: 176 lb (79.8 kg) 179 lb 3.7 oz (81.3 kg)    Labs & Radiologic Studies    CBC  Recent Labs  03/28/16 1012  WBC 7.5  HGB 14.1  HCT 40.7  MCV 91.1  PLT [id number]   Basic Metabolic Panel  Recent Labs  03/28/16 1012  NA 138  K 4.2  CL 106  CO2 25  GLUCOSE 110*  BUN 8  CREATININE 1.03  CALCIUM 9.4   Liver Function Tests  Recent Labs  03/28/16 1012  AST 22  ALT 17  ALKPHOS 99  BILITOT 1.7*  PROT 7.2  ALBUMIN 3.9   No results for input(s): LIPASE, AMYLASE in the last 72 hours. Cardiac Enzymes  Recent Labs  03/28/16 1613 03/28/16 2122 03/29/16 0412  TROPONINI <0.03 0.11* 0.79*   BNP Invalid input(s): POCBNP D-Dimer No results for input(s): DDIMER in the last 72 hours. Hemoglobin A1C No results for input(s): HGBA1C in the last 72 hours. Fasting Lipid Panel No results for input(s): CHOL, HDL, LDLCALC, TRIG, CHOLHDL, LDLDIRECT in the last 72 hours. Thyroid Function Tests No results for input(s): TSH, T4TOTAL, T3FREE, THYROIDAB in the last 72 hours.  Invalid input(s): FREET3 _____________  Dg Chest Portable 1 View  Result Date: 03/28/2016 CLINICAL DATA:  Chest pain EXAM: PORTABLE CHEST 1 VIEW COMPARISON:  05/17/2015 FINDINGS: Borderline cardiomegaly. Status post CABG. No infiltrate or pleural effusion. No pulmonary edema. IMPRESSION: Borderline cardiomegaly.  Status post CABG.  No active disease. Electronically Signed   By: Lahoma Crocker M.D.   On: 03/28/2016 10:26   Disposition   Pt is being discharged home today in good condition.  Follow-up Plans & Appointments    Follow-up Information    Rozann Lesches, MD Follow up on 04/10/2016.   Specialty:   Cardiology Why:  1:00 PM Contact information: [address] [place] [address] [phone]            Discharge Medications   Current Discharge Medication List    START taking these medications   Details  atorvastatin (LIPITOR) 40 MG tablet Take 1 tablet (40 mg total) by mouth daily at 6 PM. Qty: 30 tablet, Refills: 5    furosemide (LASIX) 40 MG tablet Take 1 tablet (40 mg total) by mouth daily. Qty: 30 tablet, Refills: 5    isosorbide mononitrate (IMDUR) 30 MG 24 hr tablet Take 1 tablet (30 mg total) by mouth daily. Qty: 30 tablet, Refills: 5    potassium chloride (K-DUR) 10 MEQ tablet Take 1 tablet (10 mEq total) by mouth daily. Qty: 30 tablet, Refills: 5      CONTINUE these medications which have CHANGED   Details  carvedilol (COREG) 12.5 MG tablet Take 1.5 tablets (18.75 mg total) by mouth 2 (two) times daily with a meal. Qty: 90 tablet, Refills: 5    losartan (COZAAR) 50  MG tablet Take 1 tablet (50 mg total) by mouth daily. Qty: 30 tablet, Refills: 5      CONTINUE these medications which have NOT CHANGED   Details  aspirin EC 81 MG tablet Take 81 mg by mouth daily.    clopidogrel (PLAVIX) 75 MG tablet TAKE 1 TABLET BY MOUTH ONCE DAILY. PATIENT NEEDS TO BE SEEN. Qty: 15 tablet, Refills: 0    gabapentin (NEURONTIN) 100 MG capsule Take 100 mg by mouth 3 (three) times daily.     LORazepam (ATIVAN) 1 MG tablet Take 1 mg by mouth 2 (two) times daily as needed for anxiety (sleep).     Multiple Vitamin (MULTIVITAMIN) tablet Take 1 tablet by mouth daily.    nitroGLYCERIN (NITROSTAT) 0.4 MG SL tablet Place 1 tablet (0.4 mg total) under the tongue every 5 (five) minutes as needed. For chest pain Qty: 25 tablet, Refills: 3    Omega-3 Fatty Acids (FISH OIL) 1000 MG CAPS Take 2,000 mg by mouth daily.    pantoprazole (PROTONIX) 40 MG tablet Take 40 mg by mouth daily.     ranolazine (RANEXA) 500 MG 12 hr tablet Take 1 tablet (500 mg total) by mouth 2 (two) times  daily. Qty: 60 tablet, Refills: 3         Aspirin prescribed at discharge?  Yes High Intensity Statin Prescribed? (Lipitor 40-80mg  or Crestor 20-40mg ): Yes Beta Blocker Prescribed? Yes For EF <40%, was ACEI/ARB Prescribed? Yes ADP Receptor Inhibitor Prescribed? (i.e. Plavix etc.-Includes Medically Managed Patients): Yes For EF <40%, Aldosterone Inhibitor Prescribed? No:  Was EF assessed during THIS hospitalization? Yes Was Cardiac Rehab II ordered? (Included Medically managed Patients): Yes   Outstanding Labs/Studies   Outpatient 2D echo.  F/u BMP in 5 days (04/03/16)  Duration of Discharge Encounter   Greater than 30 minutes including physician time.  Signed, Lyda Jester PA-C 03/29/2016, 1:47 PM

## 2016-03-29 NOTE — Progress Notes (Signed)
Patient Name: Hunter Townsend Date of Encounter: 03/29/2016  Primary Cardiologist: Dr. Jesse Sans Problem List     Active Problems:   CAD S/P percutaneous coronary angioplasty   Hx of CABG   Accelerated hypertension with NYHA class 4 systolic heart failure [facility])   Patient Profile    Hunter Townsend is a 75 year old male with a past medical history of CAD s/p CABG in 1987, most recent heart cath in October 2014 that showed occlusion of all SVG grafts, got DES to LIMA to LAD. Admitted 03/28/16 with chest pain concerning for unstable angina. Troponin elevated at 0.79.   Subjective   Doing well this morning. No complaints. He denies recurrent CP. No resting dyspnea. Left radial cath site is stable.   Inpatient Medications    Scheduled Meds: . aspirin EC  81 mg Oral Daily  . carvedilol  18.75 mg Oral BID WC  . clopidogrel  75 mg Oral Daily  . furosemide  40 mg Intravenous BID  . gabapentin  100 mg Oral TID  . isosorbide mononitrate  30 mg Oral Daily  . losartan  25 mg Oral Daily  . multivitamin with minerals  1 tablet Oral Daily  . omega-3 acid ethyl esters  1 g Oral Daily  . pantoprazole  40 mg Oral Daily  . ranolazine  500 mg Oral BID  . sodium chloride flush  3 mL Intravenous Q12H   Continuous Infusions:   PRN Meds: sodium chloride, alum & mag hydroxide-simeth, LORazepam, nitroGLYCERIN, sodium chloride flush   Vital Signs    Vitals:   03/28/16 2000 03/28/16 2100 03/28/16 2200 03/29/16 0423  BP:  (!) 144/117 (!) 154/80 (!) 153/74  Pulse: 99 97 88 83  Resp: (!) 24 17 (!) 23 20  Temp:    97.1 F (36.2 C)  TempSrc:    Axillary  SpO2: 92% 92% 93% 93%  Weight:    179 lb 3.7 oz (81.3 kg)  Height:        Intake/Output Summary (Last 24 hours) at 03/29/16 0627 Last data filed at 03/29/16 0423  Gross per 24 hour  Intake           1462.5 ml  Output              800 ml  Net            662.5 ml   Filed Weights   03/28/16 1003 03/29/16 0423  Weight: 176 lb  (79.8 kg) 179 lb 3.7 oz (81.3 kg)    Physical Exam   GEN: Well nourished, well developed, in no acute distress.  HEENT: Grossly normal.  Neck: Supple, no JVD, carotid bruits, or masses. Cardiac: RRR, no murmurs, rubs, or gallops. No clubbing, cyanosis, edema.  Radials/DP/PT 2+ and equal bilaterally.  Respiratory:  Respirations regular and unlabored, clear to auscultation bilaterally. GI: Soft, nontender, nondistended, BS + x 4. MS: no deformity or atrophy. Skin: warm and dry, no rash. Neuro:  Strength and sensation are intact. Psych: AAOx3.  Normal affect.  Labs    CBC  Recent Labs  03/28/16 1012  WBC 7.5  HGB 14.1  HCT 40.7  MCV 91.1  PLT [id number]   Basic Metabolic Panel  Recent Labs  03/28/16 1012  NA 138  K 4.2  CL 106  CO2 25  GLUCOSE 110*  BUN 8  CREATININE 1.03  CALCIUM 9.4   Liver Function Tests  Recent Labs  03/28/16 1012  AST 22  ALT 17  ALKPHOS 99  BILITOT 1.7*  PROT 7.2  ALBUMIN 3.9   No results for input(s): LIPASE, AMYLASE in the last 72 hours. Cardiac Enzymes  Recent Labs  03/28/16 1613 03/28/16 2122 03/29/16 0412  TROPONINI <0.03 0.11* 0.79*   BNP Invalid input(s): POCBNP D-Dimer No results for input(s): DDIMER in the last 72 hours. Hemoglobin A1C No results for input(s): HGBA1C in the last 72 hours. Fasting Lipid Panel No results for input(s): CHOL, HDL, LDLCALC, TRIG, CHOLHDL, LDLDIRECT in the last 72 hours. Thyroid Function Tests No results for input(s): TSH, T4TOTAL, T3FREE, THYROIDAB in the last 72 hours.  Invalid input(s): FREET3  Telemetry    NSR - Personally Reviewed   Radiology    Dg Chest Portable 1 View  Result Date: 03/28/2016 CLINICAL DATA:  Chest pain EXAM: PORTABLE CHEST 1 VIEW COMPARISON:  05/17/2015 FINDINGS: Borderline cardiomegaly. Status post CABG. No infiltrate or pleural effusion. No pulmonary edema. IMPRESSION: Borderline cardiomegaly.  Status post CABG.  No active disease. Electronically Signed    By: Lahoma Crocker M.D.   On: 03/28/2016 10:26    Cardiac Studies   Procedures   Left Heart Cath and Cors/Grafts Angiography  Conclusion     Suspect Accelerated Hypertension with Acute on Chronic Combined Systolic and Diastolic Heart Failure As the Main Etiology for His Anginal Pain.  ____________________________  Colon Flattery RCA to Mid RCA lesion, 100 %stenosed.  Ost LAD to Mid LAD lesion, 100 %stenosed. Stented segment beyond graft insertion widely patent mild disease distally  Known occlusion of all 3 vein grafts. Not visualized.  Prox Cx lesion, 45 %stenosed. 2nd Mrg-1 lesion, 75 %stenosed. 2nd Mrg-2 lesion, 60 %stenosed. -- Angiographically maybe mild progression of disease since last catheterization  LIMA-LAD and is normal in caliber and large. Dist Graft stent, 40 percent in-stent restenosis  There is severe left ventricular systolic dysfunction. The left ventricular ejection fraction is less than 25% by visual estimate.  LV end diastolic pressure is severely elevated - 36 mmHg  There is mild (2+) mitral regurgitation.   Accelerated Hypertension with Acute on Chronic Combined Systolic and Diastolic Heart Failure. -- Severely reduced LVEF with severely elevated LVEDP  Angiographically relatively stable coronary artery disease from last PCI of the LAD with moderate in-stent restenosis of the LIMA-LAD DES stent. Otherwise mild progression of disease in the circumflex system. Known occlusion of all vein grafts. The RCA fills via collaterals from the Circumflex episode which itself appears to be codominant. Stable diffuse disease in the Circumflex.     Patient Profile    Hunter Townsend is a 75 year old male with a past medical history of CAD s/p CABG in 1987, most recent heart cath in October 2014 that showed occlusion of all SVG grafts, got DES to LIMA to LAD. Admitted 03/28/16 with chest pain concerning for unstable angina. Troponin elevated at 0.79.    Assessment & Plan     1.  CAD/ Angina: LHC 03/28/16 demonstrated angiographically relatively stable coronary artery disease from last PCI of the LAD with moderate in-stent restenosis of the LIMA-LAD DES stent. Otherwise mild progression of disease in the circumflex system. Known occlusion of all vein grafts. The RCA fills via collaterals from the Circumflex episode which itself appears to be codominant. Stable diffuse disease in the Circumflex. Suspect accelerated hypertension with acute on chronic combined systolic and diastolic HF as the main etiology for his anginal pain. Continue medical therapy for CAD, with upward titration of antianginals/ antihypertensives. He is stable  w/o CP this am. Left radial cath site is stable.   2. HTN: meds adjusted yesterday. Imdur 30 mg restarted. Coreg was increased to 18.75 mg BID. IV lasix, 40 mg given last PM. Also on losartan 25 mg.   3. Acute on Chronic Combined Systolic and Diastolic CHF: The left ventricular ejection fraction is less than 25% by visual estimate, per cath report. LV end diastolic pressure was severely elevated during cath at 36 mmHg. IV Lasix started last PM, ordered for 40 mg IV BID. We will give another dose of 40 mg IV this am, to total 80 mg. Monitor I/Os to assess response.  No resting dyspnea. Ambulated around the until today to assess for exertional dyspnea. Will give 20 mEq of Kdur to prevent hypokalemia.   4. HLD: LDL in 2015 was 101. Currently not on a statin. Will add Lipitor 40 mg. Recheck FLP in 6 weeks with HFTs.    Signed, Lyda Jester, PA-C  03/29/2016, 6:27 AM  Pt seen and examined  I agree with findings as noted above by B SImmons   Pt denies CP  Breathing is OK now  LUngs are CTA  Cardiac RRR  NO S3  Ext with triv edema Cath yesterday showed severe native dz  Occlusion of all grafts except LIMA to LAD  LVEF by visual estimate was 25%   LVEDP was signif elevated at 36 mm Hg  Plan to increased medical Rx  Lasix 80 this am then home on 40 qd with  10 KCL  Labs next [facility]  F?U in clinic the following Increase losartan to 50 for better bp control   Sched F/U echo as outpt when fluid down Discussed salt limitiation with pt.  Probable d/c today.  Dorris Carnes

## 2016-03-29 NOTE — Care Management Note (Signed)
Case Management Note  Patient Details  Name: Hunter Townsend MRN: [id number] Date of Birth: 1941/04/26  Subjective/Objective:   S/p heart cath, on plavix and lasix, NCM will cont to follow for dc needs.                 Action/Plan:   Expected Discharge Date:                  Expected Discharge Plan:  Home/Self Care  In-House Referral:     Discharge planning Services  CM Consult  Post Acute Care Choice:    Choice offered to:     DME Arranged:    DME Agency:     HH Arranged:    HH Agency:     Status of Service:  Completed, signed off  If discussed at [employer] of Stay Meetings, dates discussed:    Additional Comments:  Zenon Mayo, RN 03/29/2016, 9:10 AM

## 2016-04-01 DIAGNOSIS — D320 Benign neoplasm of cerebral meninges: Secondary | ICD-10-CM | POA: Diagnosis not present

## 2016-04-01 DIAGNOSIS — G939 Disorder of brain, unspecified: Secondary | ICD-10-CM | POA: Diagnosis not present

## 2016-04-01 DIAGNOSIS — D329 Benign neoplasm of meninges, unspecified: Secondary | ICD-10-CM | POA: Diagnosis not present

## 2016-04-01 DIAGNOSIS — Z510 Encounter for antineoplastic radiation therapy: Secondary | ICD-10-CM | POA: Diagnosis not present

## 2016-04-02 DIAGNOSIS — D329 Benign neoplasm of meninges, unspecified: Secondary | ICD-10-CM | POA: Diagnosis not present

## 2016-04-02 DIAGNOSIS — D320 Benign neoplasm of cerebral meninges: Secondary | ICD-10-CM | POA: Diagnosis not present

## 2016-04-02 DIAGNOSIS — Z510 Encounter for antineoplastic radiation therapy: Secondary | ICD-10-CM | POA: Diagnosis not present

## 2016-04-03 DIAGNOSIS — I110 Hypertensive heart disease with heart failure: Secondary | ICD-10-CM | POA: Diagnosis not present

## 2016-04-03 DIAGNOSIS — Z510 Encounter for antineoplastic radiation therapy: Secondary | ICD-10-CM | POA: Diagnosis not present

## 2016-04-03 DIAGNOSIS — I5020 Unspecified systolic (congestive) heart failure: Secondary | ICD-10-CM | POA: Diagnosis not present

## 2016-04-03 DIAGNOSIS — I25110 Atherosclerotic heart disease of native coronary artery with unstable angina pectoris: Secondary | ICD-10-CM | POA: Diagnosis not present

## 2016-04-03 DIAGNOSIS — D329 Benign neoplasm of meninges, unspecified: Secondary | ICD-10-CM | POA: Diagnosis not present

## 2016-04-04 ENCOUNTER — Encounter: Payer: Self-pay | Admitting: Cardiology

## 2016-04-04 DIAGNOSIS — D329 Benign neoplasm of meninges, unspecified: Secondary | ICD-10-CM | POA: Diagnosis not present

## 2016-04-04 DIAGNOSIS — Z510 Encounter for antineoplastic radiation therapy: Secondary | ICD-10-CM | POA: Diagnosis not present

## 2016-04-04 NOTE — Progress Notes (Signed)
Cardiology Office Note  Date: 04/10/2016   ID: FAWWAZ LOSCO, DOB 1940-07-14, MRN [id number]  PCP: Rory Percy, MD  Primary Cardiologist: Rozann Lesches, MD   Chief Complaint  Patient presents with  . Coronary Artery Disease  . Hospitalization Follow-up    History of Present Illness: Hunter Townsend is a 75 y.o. male that I last saw in August. I reviewed his records, he was recently admitted to [facility] with chest pain and mild elevation in troponin I levels up to 0.79. He underwent cardiac catheterization that revealed severe known graft disease, moderate in-stent restenosis of the DES within the LIMA to LAD and otherwise mild progression of circumflex disease. LVEDP was severely elevated at 36 mmHg and he was managed medically for suspected acute on chronic combined heart failure. I reviewed the medication adjustments made including the addition of diuretic.  He comes in today denying any chest pain. He was somewhat confused about his medications, we reviewed them in detail and have decided not to keep him on Imdur which was started in the hospital. He is already on Ranexa.  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  Past Medical History:  Diagnosis Date  . Arthritis   . BPH (benign prostatic hyperplasia)   . CHF (congestive heart failure) (Hunter Townsend)   . Coronary atherosclerosis of native coronary artery    Multivessel, NSTEMI  s/p PTCA/DES to mid LAD 06/24/11,, known patent LIMA-LAD and occlusion of all vein grafts  . Hypertension   . Ischemic cardiomyopathy    EF 35-40% by cath 06/24/11 (EF 37% by nuclear stress test 06/2010).  . Myocardial infarction 2013  . PAD (peripheral artery disease) (HCC)    Status post left SFA stent - Dr. Burt Knack    Past Surgical History:  Procedure Laterality Date  . BACK SURGERY     "cut me ~ 1/2inch to take sciatic nerve out of my right leg"  . CARDIAC CATHETERIZATION  03/28/2016    . CARDIAC CATHETERIZATION N/A 03/28/2016   Procedure: Left Heart Cath and Cors/Grafts Angiography;  Surgeon: Leonie Man, MD;  Location: [facility];  Service: Cardiovascular;  Laterality: N/A;  . CATARACT EXTRACTION W/ INTRAOCULAR LENS  IMPLANT, BILATERAL    . CORONARY ANGIOPLASTY    . CORONARY ARTERY BYPASS GRAFT  1987   [place] - LIMA to LAD, SVG to RCA presumably  . LEFT HEART CATHETERIZATION WITH CORONARY/GRAFT ANGIOGRAM  06/24/2011   Procedure: LEFT HEART CATHETERIZATION WITH Beatrix Fetters;  Surgeon: Burnell Blanks, MD;  Location: [facility] CATH LAB;  Service: Cardiovascular;;  . LEFT HEART CATHETERIZATION WITH CORONARY/GRAFT ANGIOGRAM N/A 02/26/2013   Procedure: LEFT HEART CATHETERIZATION WITH Beatrix Fetters;  Surgeon: Blane Ohara, MD;  Location: [facility] CATH LAB;  Service: Cardiovascular;  Laterality: N/A;  . LOWER EXTREMITY ANGIOGRAM N/A 10/30/2011   Procedure: LOWER EXTREMITY ANGIOGRAM;  Surgeon: Sherren Mocha, MD;  Location: [facility] CATH LAB;  Service: Cardiovascular;  Laterality: N/A;  . PERCUTANEOUS CORONARY STENT INTERVENTION (PCI-S)  02/26/2013   Procedure: PERCUTANEOUS CORONARY STENT INTERVENTION (PCI-S);  Surgeon: Blane Ohara, MD;  Location: [facility] CATH LAB;  Service: Cardiovascular;;    Current Outpatient Prescriptions  Medication Sig Dispense Refill  . aspirin EC 81 MG tablet Take 81 mg by mouth daily.    Marland Kitchen atorvastatin (LIPITOR) 40 MG tablet Take 1 tablet (40 mg total) by mouth daily at 6 PM. 30 tablet 5  . carvedilol (COREG)  12.5 MG tablet Take 1.5 tablets (18.75 mg total) by mouth 2 (two) times daily with a meal. 90 tablet 5  . clopidogrel (PLAVIX) 75 MG tablet TAKE 1 TABLET BY MOUTH ONCE DAILY. PATIENT NEEDS TO BE SEEN. 15 tablet 0  . furosemide (LASIX) 40 MG tablet Take 1 tablet (40 mg total) by mouth daily. 30 tablet 5  . gabapentin (NEURONTIN) 100 MG capsule Take 100 mg by mouth 3 (three) times daily.     Marland Kitchen LORazepam (ATIVAN) 1 MG  tablet Take 1 mg by mouth 2 (two) times daily as needed for anxiety (sleep).     . losartan (COZAAR) 50 MG tablet Take 1 tablet (50 mg total) by mouth daily. 30 tablet 5  . Multiple Vitamin (MULTIVITAMIN) tablet Take 1 tablet by mouth daily.    . nitroGLYCERIN (NITROSTAT) 0.4 MG SL tablet Place 1 tablet (0.4 mg total) under the tongue every 5 (five) minutes as needed. For chest pain 25 tablet 3  . Omega-3 Fatty Acids (FISH OIL) 1000 MG CAPS Take 2,000 mg by mouth daily.    . pantoprazole (PROTONIX) 40 MG tablet Take 40 mg by mouth daily.     . potassium chloride (K-DUR) 10 MEQ tablet Take 1 tablet (10 mEq total) by mouth daily. 30 tablet 5  . ranolazine (RANEXA) 500 MG 12 hr tablet Take 1 tablet (500 mg total) by mouth 2 (two) times daily. 60 tablet 3   No current facility-administered medications for this visit.    Allergies:  Patient has no known allergies.   Social History: The patient  reports that he quit smoking about 22 years ago. His smoking use included Cigarettes. He has a 80.00 pack-year smoking history. He has never used smokeless tobacco. He reports that he does not drink alcohol or use drugs.   ROS:  Please see the history of present illness. Otherwise, complete review of systems is positive for fatigue. All other systems are reviewed and negative.  Physical Exam: VS:  BP (!) 147/80   Pulse 81   Ht 5\' 5"  (1.651 m)   Wt 172 lb (78 kg)   SpO2 98%   BMI 28.62 kg/m , BMI Body mass index is 28.62 kg/m.  Wt Readings from Last 3 Encounters:  04/10/16 172 lb (78 kg)  03/29/16 179 lb 3.7 oz (81.3 kg)  01/12/16 181 lb (82.1 kg)    Normally nourished appearing male, appears comfortable at rest.  HEENT: Conjunctiva and lids normal, oropharynx.  Neck: Supple, no elevated JVP or bruits. No thyromegaly.  Lungs: Clear auscultation, nonlabored.  Cardiac: Regular rate and rhythm, soft systolic murmur, split S2, no gallop.  Abdomen: Soft, nontender, bowel sounds present.  Skin:  Warm and dry. Extremities: No pitting edema. Distal pulses 1+.  Musculoskeletal: No kyphosis.  Neuropsychiatric: Alert and oriented x3, affect appropriate.   ECG: I personally reviewed the tracing from 03/28/2016 which showed sinus rhythm with nonspecific ST changes.  Recent Labwork: 03/28/2016: ALT 17; AST 22; B Natriuretic Peptide 217.1; BUN 8; Creatinine, Ser 1.03; Hemoglobin 14.1; Platelets 268; Potassium 4.2; Sodium 138     Component Value Date/Time   CHOL 152 06/25/2011 0400   TRIG 150 (H) 06/25/2011 0400   HDL 28 (L) 06/25/2011 0400   CHOLHDL 5.4 06/25/2011 0400   VLDL 30 06/25/2011 0400   LDLCALC 94 06/25/2011 0400    Other Studies Reviewed Today:  Cardiac catheterization 03/28/2016:  Suspect Accelerated Hypertension with Acute on Chronic Combined Systolic and Diastolic Heart Failure As the  Main Etiology for His Anginal Pain.  Ost RCA to Mid RCA lesion, 100 %stenosed.  Ost LAD to Mid LAD lesion, 100 %stenosed. Stented segment beyond graft insertion widely patent mild disease distally  Known occlusion of all 3 vein grafts. Not visualized.  Prox Cx lesion, 45 %stenosed. 2nd Mrg-1 lesion, 75 %stenosed. 2nd Mrg-2 lesion, 60 %stenosed. -- Angiographically maybe mild progression of disease since last catheterization  LIMA-LAD and is normal in caliber and large. Dist Graft stent, 40 percent in-stent restenosis  There is severe left ventricular systolic dysfunction. The left ventricular ejection fraction is less than 25% by visual estimate.  LV end diastolic pressure is severely elevated - 36 mmHg  There is mild (2+) mitral regurgitation.   Accelerated Hypertension with Acute on Chronic Combined Systolic and Diastolic Heart Failure. -- Severely reduced LVEF with severely elevated LVEDP  Angiographically relatively stable coronary artery disease from last PCI of the LAD with moderate in-stent restenosis of the LIMA-LAD DES stent. Otherwise mild progression of disease in the  circumflex system. Known occlusion of all vein grafts. The RCA fills via collaterals from the Circumflex episode which itself appears to be codominant. Stable diffuse disease in the Circumflex.  Echocardiogram 02/28/2016: Study Conclusions  - Left ventricle: The cavity size was at the upper limits of   normal. Wall thickness was normal. Systolic function appeared   mildly reduced in some views and moderately reduced in others. In   summation, the estimated ejection fraction was approximately 45%.   Left ventricular diastolic function parameters were normal. - Regional wall motion abnormality: Akinesis of the mid anterior,   mid anteroseptal, basal inferoseptal, and mid anterolateral   myocardium; severe hypokinesis of the basal anteroseptal   myocardium; moderate hypokinesis of the apical septal myocardium;   mild hypokinesis of the mid inferoseptal and apical myocardium. - Aortic valve: Trileaflet; mildly thickened, mildly calcified   leaflets. There was trivial regurgitation. - Mitral valve: There was moderate regurgitation. - Left atrium: The atrium was mildly dilated. - Right atrium: The atrium was mildly dilated. - Tricuspid valve: There was mild to moderate eccentric   regurgitation. - Pulmonary arteries: PA peak pressure: 37 mm Hg (S).  Assessment and Plan:  1. Multivessel CAD status post CABG with graft disease. Recent cardiac catheterization showed occlusion of all vein grafts with patent LIMA to LAD and patent DES within the distal graft and only 40% in-stent restenosis. LVEDP was significantly elevated and medications were adjusted including the addition of a diuretic which he is tolerating. He will drop Imdur since he is on Ranexa and otherwise continue with medical therapy. Check BMET in 2 weeks.  2. Ischemic cardiomyopathy with LVEF approximately 45% as outlined above. He was recently placed on diuretic and otherwise continues on Coreg and Cozaar.  3. Essential  hypertension, continue current regimen with recent changes. May need to further up titrate Cozaar eventually.  4. PAD with stable claudication. History of prior left SFA stent by Dr. Burt Knack.  Current medicines were reviewed with the patient today.   Orders Placed This Encounter  Procedures  . Basic Metabolic Panel (BMET)    Disposition: Follow-up in 3 months.  Signed, Satira Sark, MD, [facility] 04/10/2016 1:35 PM    [facility] [place], [place], [place] [address] Phone: [phone]; Fax: [phone]

## 2016-04-05 DIAGNOSIS — D329 Benign neoplasm of meninges, unspecified: Secondary | ICD-10-CM | POA: Diagnosis not present

## 2016-04-05 DIAGNOSIS — D320 Benign neoplasm of cerebral meninges: Secondary | ICD-10-CM | POA: Diagnosis not present

## 2016-04-05 DIAGNOSIS — Z510 Encounter for antineoplastic radiation therapy: Secondary | ICD-10-CM | POA: Diagnosis not present

## 2016-04-08 ENCOUNTER — Encounter: Payer: Self-pay | Admitting: *Deleted

## 2016-04-09 DIAGNOSIS — M79672 Pain in left foot: Secondary | ICD-10-CM | POA: Diagnosis not present

## 2016-04-09 DIAGNOSIS — G5790 Unspecified mononeuropathy of unspecified lower limb: Secondary | ICD-10-CM | POA: Diagnosis not present

## 2016-04-09 DIAGNOSIS — M79671 Pain in right foot: Secondary | ICD-10-CM | POA: Diagnosis not present

## 2016-04-09 DIAGNOSIS — G600 Hereditary motor and sensory neuropathy: Secondary | ICD-10-CM | POA: Diagnosis not present

## 2016-04-10 ENCOUNTER — Ambulatory Visit (INDEPENDENT_AMBULATORY_CARE_PROVIDER_SITE_OTHER): Payer: Medicare Other | Admitting: Cardiology

## 2016-04-10 ENCOUNTER — Encounter: Payer: Self-pay | Admitting: Cardiology

## 2016-04-10 VITALS — BP 147/80 | HR 81 | Ht 65.0 in | Wt 172.0 lb

## 2016-04-10 DIAGNOSIS — I10 Essential (primary) hypertension: Secondary | ICD-10-CM

## 2016-04-10 DIAGNOSIS — I255 Ischemic cardiomyopathy: Secondary | ICD-10-CM

## 2016-04-10 DIAGNOSIS — I739 Peripheral vascular disease, unspecified: Secondary | ICD-10-CM

## 2016-04-10 DIAGNOSIS — I2510 Atherosclerotic heart disease of native coronary artery without angina pectoris: Secondary | ICD-10-CM | POA: Diagnosis not present

## 2016-04-10 DIAGNOSIS — G6289 Other specified polyneuropathies: Secondary | ICD-10-CM | POA: Diagnosis not present

## 2016-04-10 NOTE — Patient Instructions (Addendum)
Your physician recommends that you schedule a follow-up appointment in: [place] has recommended you make the following change in your medication:   [place] physician recommends that you return for lab work in: 2 WEEKS BMP  Thank you for choosing [facility]!!

## 2016-04-12 ENCOUNTER — Telehealth: Payer: Self-pay | Admitting: Cardiology

## 2016-04-12 DIAGNOSIS — I25110 Atherosclerotic heart disease of native coronary artery with unstable angina pectoris: Secondary | ICD-10-CM

## 2016-04-12 NOTE — Telephone Encounter (Signed)
Patient walked in - he can not take atorvastatin (LIPITOR) 40 MG tablet  Makes him very achy and not able to move well

## 2016-04-15 ENCOUNTER — Encounter: Payer: Self-pay | Admitting: *Deleted

## 2016-04-15 NOTE — Telephone Encounter (Signed)
He has a history of statin intolerance per chart review. Ask him to stop the Lipitor for now, see if he can recall which other medications he has tried before. We still might be able to find an option.

## 2016-04-15 NOTE — Telephone Encounter (Signed)
Will have labs routed when received

## 2016-04-15 NOTE — Telephone Encounter (Signed)
No recent lipids done from Dr. Nadara Mustard - had BMP scanned into chart

## 2016-04-15 NOTE — Telephone Encounter (Signed)
Pt stopped Lipitor - says he doesn't remember being on anything else other than atorvastatin. Says that last labs from Dr. Nadara Mustard lipids were ok (requested)

## 2016-04-15 NOTE — Telephone Encounter (Signed)
I will review lab work when available and can decide about options at that time.

## 2016-04-16 NOTE — Telephone Encounter (Signed)
Please have him obtain an FLP.

## 2016-04-16 NOTE — Telephone Encounter (Signed)
Will mail lab orders to pt - pt aware to fast prior

## 2016-04-25 DIAGNOSIS — I25110 Atherosclerotic heart disease of native coronary artery with unstable angina pectoris: Secondary | ICD-10-CM | POA: Diagnosis not present

## 2016-05-01 ENCOUNTER — Telehealth: Payer: Self-pay | Admitting: *Deleted

## 2016-05-01 ENCOUNTER — Telehealth: Payer: Self-pay | Admitting: Cardiology

## 2016-05-01 MED ORDER — RANOLAZINE ER 500 MG PO TB12: 500.0000 mg | ORAL_TABLET | Freq: Two times a day (BID) | ORAL | 0 refills | Status: DC

## 2016-05-01 MED ORDER — ROSUVASTATIN CALCIUM 5 MG PO TABS: 5.0000 mg | ORAL_TABLET | Freq: Every day | ORAL | 3 refills | Status: DC

## 2016-05-01 NOTE — Telephone Encounter (Signed)
Pt willing to try Crestor 5 mg - will update medication list and route to pcp

## 2016-05-01 NOTE — Telephone Encounter (Signed)
ranolazine (RANEXA) 500 MG 12 hr tablet   Wants to know if there is a cheaper medication

## 2016-05-01 NOTE — Telephone Encounter (Signed)
Pt currently in hole with insurance - will give samples of Ranexa - pt will come by office to pick up

## 2016-05-01 NOTE — Telephone Encounter (Signed)
-----   Message from Satira Sark, MD sent at 04/30/2016  2:07 PM EST ----- Results reviewed. LDL is 93, HDL 30. Since he did not tolerate Lipitor, we might try Crestor 5 mg daily for aggressive lipid management in light of his CAD and PAD. A copy of this test should be forwarded to Rory Percy, MD.

## 2016-05-07 DIAGNOSIS — M79671 Pain in right foot: Secondary | ICD-10-CM | POA: Diagnosis not present

## 2016-05-07 DIAGNOSIS — G600 Hereditary motor and sensory neuropathy: Secondary | ICD-10-CM | POA: Diagnosis not present

## 2016-05-07 DIAGNOSIS — M79672 Pain in left foot: Secondary | ICD-10-CM | POA: Diagnosis not present

## 2016-05-07 DIAGNOSIS — G5790 Unspecified mononeuropathy of unspecified lower limb: Secondary | ICD-10-CM | POA: Diagnosis not present

## 2016-05-08 DIAGNOSIS — M791 Myalgia: Secondary | ICD-10-CM | POA: Diagnosis not present

## 2016-05-08 DIAGNOSIS — I10 Essential (primary) hypertension: Secondary | ICD-10-CM | POA: Diagnosis not present

## 2016-05-08 DIAGNOSIS — G629 Polyneuropathy, unspecified: Secondary | ICD-10-CM | POA: Diagnosis not present

## 2016-05-08 DIAGNOSIS — Z6827 Body mass index (BMI) 27.0-27.9, adult: Secondary | ICD-10-CM | POA: Diagnosis not present

## 2016-05-08 DIAGNOSIS — I25110 Atherosclerotic heart disease of native coronary artery with unstable angina pectoris: Secondary | ICD-10-CM | POA: Diagnosis not present

## 2016-05-16 DIAGNOSIS — G629 Polyneuropathy, unspecified: Secondary | ICD-10-CM | POA: Diagnosis not present

## 2016-05-16 DIAGNOSIS — I25110 Atherosclerotic heart disease of native coronary artery with unstable angina pectoris: Secondary | ICD-10-CM | POA: Diagnosis not present

## 2016-05-31 DIAGNOSIS — Z7982 Long term (current) use of aspirin: Secondary | ICD-10-CM | POA: Diagnosis not present

## 2016-05-31 DIAGNOSIS — Z7902 Long term (current) use of antithrombotics/antiplatelets: Secondary | ICD-10-CM | POA: Diagnosis not present

## 2016-05-31 DIAGNOSIS — I10 Essential (primary) hypertension: Secondary | ICD-10-CM | POA: Diagnosis not present

## 2016-05-31 DIAGNOSIS — J111 Influenza due to unidentified influenza virus with other respiratory manifestations: Secondary | ICD-10-CM | POA: Diagnosis not present

## 2016-05-31 DIAGNOSIS — Z79899 Other long term (current) drug therapy: Secondary | ICD-10-CM | POA: Diagnosis not present

## 2016-05-31 DIAGNOSIS — R05 Cough: Secondary | ICD-10-CM | POA: Diagnosis not present

## 2016-05-31 DIAGNOSIS — R079 Chest pain, unspecified: Secondary | ICD-10-CM | POA: Diagnosis not present

## 2016-06-06 ENCOUNTER — Ambulatory Visit (INDEPENDENT_AMBULATORY_CARE_PROVIDER_SITE_OTHER): Payer: Medicare Other | Admitting: Otolaryngology

## 2016-06-17 DIAGNOSIS — Z6828 Body mass index (BMI) 28.0-28.9, adult: Secondary | ICD-10-CM | POA: Diagnosis not present

## 2016-06-17 DIAGNOSIS — Z0001 Encounter for general adult medical examination with abnormal findings: Secondary | ICD-10-CM | POA: Diagnosis not present

## 2016-06-17 DIAGNOSIS — Z1389 Encounter for screening for other disorder: Secondary | ICD-10-CM | POA: Diagnosis not present

## 2016-06-17 DIAGNOSIS — I10 Essential (primary) hypertension: Secondary | ICD-10-CM | POA: Diagnosis not present

## 2016-06-17 DIAGNOSIS — G629 Polyneuropathy, unspecified: Secondary | ICD-10-CM | POA: Diagnosis not present

## 2016-06-17 DIAGNOSIS — I2510 Atherosclerotic heart disease of native coronary artery without angina pectoris: Secondary | ICD-10-CM | POA: Diagnosis not present

## 2016-07-02 ENCOUNTER — Other Ambulatory Visit: Payer: Self-pay | Admitting: Cardiology

## 2016-07-16 NOTE — Progress Notes (Signed)
Cardiology Office Note  Date: 07/17/2016   ID: Hunter Townsend, DOB 1940/10/04, MRN [id number]  PCP: Rory Percy, MD  Primary Cardiologist: Rozann Lesches, MD   Chief Complaint  Patient presents with  . Coronary Artery Disease    History of Present Illness: Hunter Townsend is a 76 y.o. male last seen in November 2017. He presents for a routine follow-up visit, states that he has done well from a cardiac perspective. No significant angina symptoms or nitroglycerin use since last encounter. He does report interval diagnosis of influenza, is back to baseline now.  I reviewed his medications which are stable and outlined below. He has done well with Ranexa. He continues to see Dr. Nadara Mustard every 3 months. Labwork from November 2017 showed LDL 93. He has been able to tolerate low-dose Crestor, has had other statin intolerances however.  His last echocardiogram revealed LVEF 45%. He does not report any weight gain or leg swelling.  Past Medical History:  Diagnosis Date  . Arthritis   . BPH (benign prostatic hyperplasia)   . CHF (congestive heart failure) ([place])   . Coronary atherosclerosis of native coronary artery    Multivessel, NSTEMI  s/p PTCA/DES to mid LAD 06/24/11,, known patent LIMA-LAD and occlusion of all vein grafts  . Hypertension   . Ischemic cardiomyopathy    EF 35-40% by cath 06/24/11 (EF 37% by nuclear stress test 06/2010).  . Myocardial infarction 2013  . PAD (peripheral artery disease) (HCC)    Status post left SFA stent - Dr. Burt Knack    Past Surgical History:  Procedure Laterality Date  . BACK SURGERY     "cut me ~ 1/2inch to take sciatic nerve out of my right leg"  . CARDIAC CATHETERIZATION  03/28/2016  . CARDIAC CATHETERIZATION N/A 03/28/2016   Procedure: Left Heart Cath and Cors/Grafts Angiography;  Surgeon: Leonie Man, MD;  Location: [facility];  Service: Cardiovascular;  Laterality: N/A;  . CATARACT EXTRACTION W/ INTRAOCULAR LENS  IMPLANT,  BILATERAL    . CORONARY ANGIOPLASTY    . CORONARY ARTERY BYPASS GRAFT  1987   [place], [place] - LIMA to LAD, SVG to RCA presumably  . LEFT HEART CATHETERIZATION WITH CORONARY/GRAFT ANGIOGRAM  06/24/2011   Procedure: LEFT HEART CATHETERIZATION WITH Beatrix Fetters;  Surgeon: Burnell Blanks, MD;  Location: [facility] CATH LAB;  Service: Cardiovascular;;  . LEFT HEART CATHETERIZATION WITH CORONARY/GRAFT ANGIOGRAM N/A 02/26/2013   Procedure: LEFT HEART CATHETERIZATION WITH Beatrix Fetters;  Surgeon: Blane Ohara, MD;  Location: [facility] CATH LAB;  Service: Cardiovascular;  Laterality: N/A;  . LOWER EXTREMITY ANGIOGRAM N/A 10/30/2011   Procedure: LOWER EXTREMITY ANGIOGRAM;  Surgeon: Sherren Mocha, MD;  Location: [facility] CATH LAB;  Service: Cardiovascular;  Laterality: N/A;  . PERCUTANEOUS CORONARY STENT INTERVENTION (PCI-S)  02/26/2013   Procedure: PERCUTANEOUS CORONARY STENT INTERVENTION (PCI-S);  Surgeon: Blane Ohara, MD;  Location: [facility] CATH LAB;  Service: Cardiovascular;;    Current Outpatient Prescriptions  Medication Sig Dispense Refill  . carvedilol (COREG) 12.5 MG tablet Take 1.5 tablets (18.75 mg total) by mouth 2 (two) times daily with a meal. 90 tablet 5  . clopidogrel (PLAVIX) 75 MG tablet TAKE 1 TABLET BY MOUTH ONCE DAILY. PATIENT NEEDS TO Townsend SEEN. 15 tablet 0  . furosemide (LASIX) 40 MG tablet Take 1 tablet (40 mg total) by mouth daily. 30 tablet 5  . gabapentin (NEURONTIN) 100 MG capsule Take 100 mg by mouth 3 (three) times daily.     Marland Kitchen  LORazepam (ATIVAN) 1 MG tablet Take 1 mg by mouth 2 (two) times daily as needed for anxiety (sleep).     . losartan (COZAAR) 50 MG tablet Take 1 tablet (50 mg total) by mouth daily. 30 tablet 5  . Multiple Vitamin (MULTIVITAMIN) tablet Take 1 tablet by mouth daily.    . nitroGLYCERIN (NITROSTAT) 0.4 MG SL tablet Place 1 tablet (0.4 mg total) under the tongue every 5 (five) minutes as needed. For chest pain 25 tablet 3  . Omega-3 Fatty Acids  (FISH OIL) 1000 MG CAPS Take 2,000 mg by mouth daily.    . pantoprazole (PROTONIX) 40 MG tablet Take 40 mg by mouth daily.     . potassium chloride (K-DUR) 10 MEQ tablet Take 1 tablet (10 mEq total) by mouth daily. 30 tablet 5  . ranolazine (RANEXA) 500 MG 12 hr tablet Take 1 tablet (500 mg total) by mouth 2 (two) times daily. 56 tablet 0  . rosuvastatin (CRESTOR) 5 MG tablet Take 1 tablet (5 mg total) by mouth daily. 90 tablet 3  . aspirin EC 81 MG tablet Take 81 mg by mouth daily.    Marland Kitchen atorvastatin (LIPITOR) 40 MG tablet Take 1 tablet (40 mg total) by mouth daily at 6 PM. 30 tablet 5   No current facility-administered medications for this visit.    Allergies:  Patient has no known allergies.   Social History: The patient  reports that he quit smoking about 23 years ago. His smoking use included Cigarettes. He has a 80.00 pack-year smoking history. He has never used smokeless tobacco. He reports that he does not drink alcohol or use drugs.   ROS:  Please see the history of present illness. Otherwise, complete review of systems is positive for arthritic pains mainly in his back and legs.  All other systems are reviewed and negative.   Physical Exam: VS:  BP (!) 145/78   Pulse 81   Ht 5' (1.524 m)   Wt 167 lb 12.8 oz (76.1 kg)   SpO2 99%   BMI 32.77 kg/m , BMI Body mass index is 32.77 kg/m.  Wt Readings from Last 3 Encounters:  07/17/16 167 lb 12.8 oz (76.1 kg)  04/10/16 172 lb (78 kg)  03/29/16 179 lb 3.7 oz (81.3 kg)    Normally nourished appearing male, appears comfortable at rest.  HEENT: Conjunctiva and lids normal, oropharynx.  Neck: Supple, no elevated JVP or bruits. No thyromegaly.  Lungs: Clear auscultation, nonlabored.  Cardiac: Regular rate and rhythm, soft systolic murmur, split S2, no gallop.  Abdomen: Soft, nontender, bowel sounds present.  Skin: Warm and dry. Extremities: No pitting edema. Distal pulses 1+.  Musculoskeletal: No kyphosis.   Neuropsychiatric: Alert and oriented x3, affect appropriate.   ECG: I personally reviewed the tracing from 03/28/2016 which showed sinus rhythm with nonspecific ST changes.  Recent Labwork: 03/28/2016: ALT 17; AST 22; B Natriuretic Peptide 217.1; BUN 8; Creatinine, Ser 1.03; Hemoglobin 14.1; Platelets 268; Potassium 4.2; Sodium 138  November 2017: Cholesterol 146, triglycerides 113, HDL 30, LDL 93  Other Studies Reviewed Today:  Cardiac catheterization 03/28/2016:  Suspect Accelerated Hypertension with Acute on Chronic Combined Systolic and Diastolic Heart Failure As the Main Etiology for His Anginal Pain.  Ost RCA to Mid RCA lesion, 100 %stenosed.  Ost LAD to Mid LAD lesion, 100 %stenosed. Stented segment beyond graft insertion widely patent mild disease distally  Known occlusion of all 3 vein grafts. Not visualized.  Prox Cx lesion, 45 %stenosed. 2nd  Mrg-1 lesion, 75 %stenosed. 2nd Mrg-2 lesion, 60 %stenosed. -- Angiographically maybe mild progression of disease since last catheterization  LIMA-LAD and is normal in caliber and large. Dist Graft stent, 40 percent in-stent restenosis  There is severe left ventricular systolic dysfunction. The left ventricular ejection fraction is less than 25% by visual estimate.  LV end diastolic pressure is severely elevated - 36 mmHg  There is mild (2+) mitral regurgitation.  Accelerated Hypertension with Acute on Chronic Combined Systolic and Diastolic Heart Failure. -- Severely reduced LVEF with severely elevated LVEDP  Angiographically relatively stable coronary artery disease from last PCI of the LAD with moderate in-stent restenosis of the LIMA-LAD DES stent. Otherwise mild progression of disease in the circumflex system. Known occlusion of all vein grafts. The RCA fills via collaterals from the Circumflex episode which itself appears to Townsend codominant. Stable diffuse disease in the Circumflex.  Echocardiogram 02/28/2016: Study  Conclusions  - Left ventricle: The cavity size was at the upper limits of normal. Wall thickness was normal. Systolic function appeared mildly reduced in some views and moderately reduced in others. In summation, the estimated ejection fraction was approximately 45%. Left ventricular diastolic function parameters were normal. - Regional wall motion abnormality: Akinesis of the mid anterior, mid anteroseptal, basal inferoseptal, and mid anterolateral myocardium; severe hypokinesis of the basal anteroseptal myocardium; moderate hypokinesis of the apical septal myocardium; mild hypokinesis of the mid inferoseptal and apical myocardium. - Aortic valve: Trileaflet; mildly thickened, mildly calcified leaflets. There was trivial regurgitation. - Mitral valve: There was moderate regurgitation. - Left atrium: The atrium was mildly dilated. - Right atrium: The atrium was mildly dilated. - Tricuspid valve: There was mild to moderate eccentric regurgitation. - Pulmonary arteries: PA peak pressure: 37 mm Hg (S).  Assessment and Plan:  1. Symptomatically stable multivessel CAD status post CABG with graft disease. Most recent assessment shows patent LIMA to LAD and patent DES within the distal graft with only 40% in-stent restenosis. He is being managed medically and has done well with Ranexa. He remains on dual antiplatelet therapy.  2. Ischemic cardiomyopathy with LVEF 45% by most recent assessment. No evidence of volume overload.  3. Essential hypertension, no changes made to current regimen.  4. Hyperlipidemia, tolerating low-dose Crestor.  Current medicines were reviewed with the patient today.  Disposition: Follow-up in 6 months.  Signed, Satira Sark, MD, [facility] 07/17/2016 1:20 PM    [facility] [place], [place],  [address] Phone: [phone]; Fax: [phone]

## 2016-07-17 ENCOUNTER — Encounter: Payer: Self-pay | Admitting: Cardiology

## 2016-07-17 ENCOUNTER — Ambulatory Visit (INDEPENDENT_AMBULATORY_CARE_PROVIDER_SITE_OTHER): Payer: Medicare Other | Admitting: Cardiology

## 2016-07-17 VITALS — BP 145/78 | HR 81 | Ht 60.0 in | Wt 167.8 lb

## 2016-07-17 DIAGNOSIS — E782 Mixed hyperlipidemia: Secondary | ICD-10-CM | POA: Diagnosis not present

## 2016-07-17 DIAGNOSIS — I255 Ischemic cardiomyopathy: Secondary | ICD-10-CM

## 2016-07-17 DIAGNOSIS — I2510 Atherosclerotic heart disease of native coronary artery without angina pectoris: Secondary | ICD-10-CM | POA: Diagnosis not present

## 2016-07-17 DIAGNOSIS — I10 Essential (primary) hypertension: Secondary | ICD-10-CM

## 2016-07-17 NOTE — Patient Instructions (Signed)
Your physician wants you to follow-up in: 6 months with Dr. McDowell You will receive a reminder letter in the mail two months in advance. If you don't receive a letter, please call our office to schedule the follow-up appointment.  Your physician recommends that you continue on your current medications as directed. Please refer to the Current Medication list given to you today.  Thank you for choosing [facility]!!    

## 2016-08-22 DIAGNOSIS — M47812 Spondylosis without myelopathy or radiculopathy, cervical region: Secondary | ICD-10-CM | POA: Diagnosis not present

## 2016-08-22 DIAGNOSIS — M47816 Spondylosis without myelopathy or radiculopathy, lumbar region: Secondary | ICD-10-CM | POA: Diagnosis not present

## 2016-08-31 ENCOUNTER — Other Ambulatory Visit: Payer: Self-pay | Admitting: Cardiology

## 2016-09-02 DIAGNOSIS — M79672 Pain in left foot: Secondary | ICD-10-CM | POA: Diagnosis not present

## 2016-09-02 DIAGNOSIS — M79671 Pain in right foot: Secondary | ICD-10-CM | POA: Diagnosis not present

## 2016-09-02 DIAGNOSIS — M25579 Pain in unspecified ankle and joints of unspecified foot: Secondary | ICD-10-CM | POA: Diagnosis not present

## 2016-09-10 DIAGNOSIS — H35342 Macular cyst, hole, or pseudohole, left eye: Secondary | ICD-10-CM | POA: Diagnosis not present

## 2016-09-10 DIAGNOSIS — H35373 Puckering of macula, bilateral: Secondary | ICD-10-CM | POA: Diagnosis not present

## 2016-09-25 ENCOUNTER — Other Ambulatory Visit: Payer: Self-pay | Admitting: Cardiology

## 2016-09-30 ENCOUNTER — Other Ambulatory Visit: Payer: Self-pay | Admitting: Cardiology

## 2016-09-30 ENCOUNTER — Telehealth: Payer: Self-pay | Admitting: Cardiology

## 2016-09-30 DIAGNOSIS — M79671 Pain in right foot: Secondary | ICD-10-CM | POA: Diagnosis not present

## 2016-09-30 DIAGNOSIS — M25579 Pain in unspecified ankle and joints of unspecified foot: Secondary | ICD-10-CM | POA: Diagnosis not present

## 2016-09-30 DIAGNOSIS — M79672 Pain in left foot: Secondary | ICD-10-CM | POA: Diagnosis not present

## 2016-09-30 MED ORDER — LOSARTAN POTASSIUM 50 MG PO TABS: 50.0000 mg | ORAL_TABLET | Freq: Every day | ORAL | 6 refills | Status: DC

## 2016-09-30 NOTE — Telephone Encounter (Signed)
losartan (COZAAR) 50 MG tablet refill needed [place]

## 2016-10-17 DIAGNOSIS — M47816 Spondylosis without myelopathy or radiculopathy, lumbar region: Secondary | ICD-10-CM | POA: Diagnosis not present

## 2016-10-17 DIAGNOSIS — M9903 Segmental and somatic dysfunction of lumbar region: Secondary | ICD-10-CM | POA: Diagnosis not present

## 2016-10-17 DIAGNOSIS — M9901 Segmental and somatic dysfunction of cervical region: Secondary | ICD-10-CM | POA: Diagnosis not present

## 2016-10-17 DIAGNOSIS — M47812 Spondylosis without myelopathy or radiculopathy, cervical region: Secondary | ICD-10-CM | POA: Diagnosis not present

## 2016-10-17 DIAGNOSIS — M9902 Segmental and somatic dysfunction of thoracic region: Secondary | ICD-10-CM | POA: Diagnosis not present

## 2016-10-22 DIAGNOSIS — M47816 Spondylosis without myelopathy or radiculopathy, lumbar region: Secondary | ICD-10-CM | POA: Diagnosis not present

## 2016-10-22 DIAGNOSIS — M9903 Segmental and somatic dysfunction of lumbar region: Secondary | ICD-10-CM | POA: Diagnosis not present

## 2016-10-22 DIAGNOSIS — M9902 Segmental and somatic dysfunction of thoracic region: Secondary | ICD-10-CM | POA: Diagnosis not present

## 2016-10-22 DIAGNOSIS — M9901 Segmental and somatic dysfunction of cervical region: Secondary | ICD-10-CM | POA: Diagnosis not present

## 2016-10-22 DIAGNOSIS — M47812 Spondylosis without myelopathy or radiculopathy, cervical region: Secondary | ICD-10-CM | POA: Diagnosis not present

## 2016-10-24 DIAGNOSIS — M9901 Segmental and somatic dysfunction of cervical region: Secondary | ICD-10-CM | POA: Diagnosis not present

## 2016-10-24 DIAGNOSIS — M47812 Spondylosis without myelopathy or radiculopathy, cervical region: Secondary | ICD-10-CM | POA: Diagnosis not present

## 2016-10-24 DIAGNOSIS — M47816 Spondylosis without myelopathy or radiculopathy, lumbar region: Secondary | ICD-10-CM | POA: Diagnosis not present

## 2016-10-24 DIAGNOSIS — M9903 Segmental and somatic dysfunction of lumbar region: Secondary | ICD-10-CM | POA: Diagnosis not present

## 2016-10-24 DIAGNOSIS — M9902 Segmental and somatic dysfunction of thoracic region: Secondary | ICD-10-CM | POA: Diagnosis not present

## 2016-10-25 ENCOUNTER — Other Ambulatory Visit: Payer: Self-pay | Admitting: Cardiology

## 2016-10-28 DIAGNOSIS — M79672 Pain in left foot: Secondary | ICD-10-CM | POA: Diagnosis not present

## 2016-10-28 DIAGNOSIS — M25579 Pain in unspecified ankle and joints of unspecified foot: Secondary | ICD-10-CM | POA: Diagnosis not present

## 2016-10-28 DIAGNOSIS — M79671 Pain in right foot: Secondary | ICD-10-CM | POA: Diagnosis not present

## 2016-10-30 DIAGNOSIS — M47812 Spondylosis without myelopathy or radiculopathy, cervical region: Secondary | ICD-10-CM | POA: Diagnosis not present

## 2016-10-30 DIAGNOSIS — M47816 Spondylosis without myelopathy or radiculopathy, lumbar region: Secondary | ICD-10-CM | POA: Diagnosis not present

## 2016-10-30 DIAGNOSIS — M9903 Segmental and somatic dysfunction of lumbar region: Secondary | ICD-10-CM | POA: Diagnosis not present

## 2016-10-30 DIAGNOSIS — M9901 Segmental and somatic dysfunction of cervical region: Secondary | ICD-10-CM | POA: Diagnosis not present

## 2016-10-30 DIAGNOSIS — M9902 Segmental and somatic dysfunction of thoracic region: Secondary | ICD-10-CM | POA: Diagnosis not present

## 2016-11-04 DIAGNOSIS — D320 Benign neoplasm of cerebral meninges: Secondary | ICD-10-CM | POA: Diagnosis not present

## 2016-11-04 DIAGNOSIS — D329 Benign neoplasm of meninges, unspecified: Secondary | ICD-10-CM | POA: Diagnosis not present

## 2016-11-04 DIAGNOSIS — Z483 Aftercare following surgery for neoplasm: Secondary | ICD-10-CM | POA: Diagnosis not present

## 2016-11-04 DIAGNOSIS — Z923 Personal history of irradiation: Secondary | ICD-10-CM | POA: Diagnosis not present

## 2016-11-05 DIAGNOSIS — M47816 Spondylosis without myelopathy or radiculopathy, lumbar region: Secondary | ICD-10-CM | POA: Diagnosis not present

## 2016-11-05 DIAGNOSIS — M9902 Segmental and somatic dysfunction of thoracic region: Secondary | ICD-10-CM | POA: Diagnosis not present

## 2016-11-05 DIAGNOSIS — M47812 Spondylosis without myelopathy or radiculopathy, cervical region: Secondary | ICD-10-CM | POA: Diagnosis not present

## 2016-11-05 DIAGNOSIS — M9901 Segmental and somatic dysfunction of cervical region: Secondary | ICD-10-CM | POA: Diagnosis not present

## 2016-11-05 DIAGNOSIS — M9903 Segmental and somatic dysfunction of lumbar region: Secondary | ICD-10-CM | POA: Diagnosis not present

## 2016-11-12 DIAGNOSIS — M47812 Spondylosis without myelopathy or radiculopathy, cervical region: Secondary | ICD-10-CM | POA: Diagnosis not present

## 2016-11-12 DIAGNOSIS — M9901 Segmental and somatic dysfunction of cervical region: Secondary | ICD-10-CM | POA: Diagnosis not present

## 2016-11-12 DIAGNOSIS — M9903 Segmental and somatic dysfunction of lumbar region: Secondary | ICD-10-CM | POA: Diagnosis not present

## 2016-11-12 DIAGNOSIS — M47816 Spondylosis without myelopathy or radiculopathy, lumbar region: Secondary | ICD-10-CM | POA: Diagnosis not present

## 2016-11-12 DIAGNOSIS — M9902 Segmental and somatic dysfunction of thoracic region: Secondary | ICD-10-CM | POA: Diagnosis not present

## 2016-11-19 DIAGNOSIS — M9903 Segmental and somatic dysfunction of lumbar region: Secondary | ICD-10-CM | POA: Diagnosis not present

## 2016-11-19 DIAGNOSIS — M9902 Segmental and somatic dysfunction of thoracic region: Secondary | ICD-10-CM | POA: Diagnosis not present

## 2016-11-19 DIAGNOSIS — M47816 Spondylosis without myelopathy or radiculopathy, lumbar region: Secondary | ICD-10-CM | POA: Diagnosis not present

## 2016-11-19 DIAGNOSIS — M47812 Spondylosis without myelopathy or radiculopathy, cervical region: Secondary | ICD-10-CM | POA: Diagnosis not present

## 2016-11-19 DIAGNOSIS — M9901 Segmental and somatic dysfunction of cervical region: Secondary | ICD-10-CM | POA: Diagnosis not present

## 2016-11-21 DIAGNOSIS — M47816 Spondylosis without myelopathy or radiculopathy, lumbar region: Secondary | ICD-10-CM | POA: Diagnosis not present

## 2016-11-21 DIAGNOSIS — M9901 Segmental and somatic dysfunction of cervical region: Secondary | ICD-10-CM | POA: Diagnosis not present

## 2016-11-21 DIAGNOSIS — M47812 Spondylosis without myelopathy or radiculopathy, cervical region: Secondary | ICD-10-CM | POA: Diagnosis not present

## 2016-11-21 DIAGNOSIS — M9903 Segmental and somatic dysfunction of lumbar region: Secondary | ICD-10-CM | POA: Diagnosis not present

## 2016-11-21 DIAGNOSIS — M9902 Segmental and somatic dysfunction of thoracic region: Secondary | ICD-10-CM | POA: Diagnosis not present

## 2016-11-28 DIAGNOSIS — M47816 Spondylosis without myelopathy or radiculopathy, lumbar region: Secondary | ICD-10-CM | POA: Diagnosis not present

## 2016-11-28 DIAGNOSIS — M47812 Spondylosis without myelopathy or radiculopathy, cervical region: Secondary | ICD-10-CM | POA: Diagnosis not present

## 2016-11-28 DIAGNOSIS — M9901 Segmental and somatic dysfunction of cervical region: Secondary | ICD-10-CM | POA: Diagnosis not present

## 2016-11-28 DIAGNOSIS — M9902 Segmental and somatic dysfunction of thoracic region: Secondary | ICD-10-CM | POA: Diagnosis not present

## 2016-11-28 DIAGNOSIS — M9903 Segmental and somatic dysfunction of lumbar region: Secondary | ICD-10-CM | POA: Diagnosis not present

## 2016-11-29 ENCOUNTER — Other Ambulatory Visit: Payer: Self-pay | Admitting: Cardiology

## 2016-12-03 DIAGNOSIS — M79672 Pain in left foot: Secondary | ICD-10-CM | POA: Diagnosis not present

## 2016-12-03 DIAGNOSIS — M79671 Pain in right foot: Secondary | ICD-10-CM | POA: Diagnosis not present

## 2016-12-03 DIAGNOSIS — M25579 Pain in unspecified ankle and joints of unspecified foot: Secondary | ICD-10-CM | POA: Diagnosis not present

## 2016-12-09 DIAGNOSIS — M9903 Segmental and somatic dysfunction of lumbar region: Secondary | ICD-10-CM | POA: Diagnosis not present

## 2016-12-09 DIAGNOSIS — M47812 Spondylosis without myelopathy or radiculopathy, cervical region: Secondary | ICD-10-CM | POA: Diagnosis not present

## 2016-12-09 DIAGNOSIS — M9902 Segmental and somatic dysfunction of thoracic region: Secondary | ICD-10-CM | POA: Diagnosis not present

## 2016-12-09 DIAGNOSIS — M47816 Spondylosis without myelopathy or radiculopathy, lumbar region: Secondary | ICD-10-CM | POA: Diagnosis not present

## 2016-12-09 DIAGNOSIS — M9901 Segmental and somatic dysfunction of cervical region: Secondary | ICD-10-CM | POA: Diagnosis not present

## 2016-12-11 DIAGNOSIS — I10 Essential (primary) hypertension: Secondary | ICD-10-CM | POA: Diagnosis not present

## 2016-12-11 DIAGNOSIS — G629 Polyneuropathy, unspecified: Secondary | ICD-10-CM | POA: Diagnosis not present

## 2016-12-11 DIAGNOSIS — M1990 Unspecified osteoarthritis, unspecified site: Secondary | ICD-10-CM | POA: Diagnosis not present

## 2016-12-11 DIAGNOSIS — M4800 Spinal stenosis, site unspecified: Secondary | ICD-10-CM | POA: Diagnosis not present

## 2016-12-11 DIAGNOSIS — F419 Anxiety disorder, unspecified: Secondary | ICD-10-CM | POA: Diagnosis not present

## 2016-12-11 DIAGNOSIS — Z6828 Body mass index (BMI) 28.0-28.9, adult: Secondary | ICD-10-CM | POA: Diagnosis not present

## 2016-12-11 DIAGNOSIS — I209 Angina pectoris, unspecified: Secondary | ICD-10-CM | POA: Diagnosis not present

## 2016-12-11 DIAGNOSIS — I739 Peripheral vascular disease, unspecified: Secondary | ICD-10-CM | POA: Diagnosis not present

## 2016-12-16 DIAGNOSIS — M9903 Segmental and somatic dysfunction of lumbar region: Secondary | ICD-10-CM | POA: Diagnosis not present

## 2016-12-16 DIAGNOSIS — M47816 Spondylosis without myelopathy or radiculopathy, lumbar region: Secondary | ICD-10-CM | POA: Diagnosis not present

## 2016-12-16 DIAGNOSIS — M9901 Segmental and somatic dysfunction of cervical region: Secondary | ICD-10-CM | POA: Diagnosis not present

## 2016-12-16 DIAGNOSIS — M9902 Segmental and somatic dysfunction of thoracic region: Secondary | ICD-10-CM | POA: Diagnosis not present

## 2016-12-16 DIAGNOSIS — M47812 Spondylosis without myelopathy or radiculopathy, cervical region: Secondary | ICD-10-CM | POA: Diagnosis not present

## 2016-12-19 ENCOUNTER — Other Ambulatory Visit: Payer: Self-pay | Admitting: *Deleted

## 2016-12-19 MED ORDER — RANOLAZINE ER 500 MG PO TB12: ORAL_TABLET | ORAL | 1 refills | Status: DC

## 2016-12-23 ENCOUNTER — Other Ambulatory Visit: Payer: Self-pay | Admitting: *Deleted

## 2016-12-23 DIAGNOSIS — M9903 Segmental and somatic dysfunction of lumbar region: Secondary | ICD-10-CM | POA: Diagnosis not present

## 2016-12-23 DIAGNOSIS — M9901 Segmental and somatic dysfunction of cervical region: Secondary | ICD-10-CM | POA: Diagnosis not present

## 2016-12-23 DIAGNOSIS — M47816 Spondylosis without myelopathy or radiculopathy, lumbar region: Secondary | ICD-10-CM | POA: Diagnosis not present

## 2016-12-23 DIAGNOSIS — M9902 Segmental and somatic dysfunction of thoracic region: Secondary | ICD-10-CM | POA: Diagnosis not present

## 2016-12-23 DIAGNOSIS — M47812 Spondylosis without myelopathy or radiculopathy, cervical region: Secondary | ICD-10-CM | POA: Diagnosis not present

## 2016-12-23 MED ORDER — ROSUVASTATIN CALCIUM 5 MG PO TABS: 5.0000 mg | ORAL_TABLET | Freq: Every day | ORAL | 1 refills | Status: DC

## 2016-12-30 DIAGNOSIS — M9901 Segmental and somatic dysfunction of cervical region: Secondary | ICD-10-CM | POA: Diagnosis not present

## 2016-12-30 DIAGNOSIS — M47812 Spondylosis without myelopathy or radiculopathy, cervical region: Secondary | ICD-10-CM | POA: Diagnosis not present

## 2016-12-30 DIAGNOSIS — M9903 Segmental and somatic dysfunction of lumbar region: Secondary | ICD-10-CM | POA: Diagnosis not present

## 2016-12-30 DIAGNOSIS — M47816 Spondylosis without myelopathy or radiculopathy, lumbar region: Secondary | ICD-10-CM | POA: Diagnosis not present

## 2016-12-30 DIAGNOSIS — M9902 Segmental and somatic dysfunction of thoracic region: Secondary | ICD-10-CM | POA: Diagnosis not present

## 2017-01-06 DIAGNOSIS — M9902 Segmental and somatic dysfunction of thoracic region: Secondary | ICD-10-CM | POA: Diagnosis not present

## 2017-01-06 DIAGNOSIS — M9901 Segmental and somatic dysfunction of cervical region: Secondary | ICD-10-CM | POA: Diagnosis not present

## 2017-01-06 DIAGNOSIS — M47816 Spondylosis without myelopathy or radiculopathy, lumbar region: Secondary | ICD-10-CM | POA: Diagnosis not present

## 2017-01-06 DIAGNOSIS — M9903 Segmental and somatic dysfunction of lumbar region: Secondary | ICD-10-CM | POA: Diagnosis not present

## 2017-01-06 DIAGNOSIS — M47812 Spondylosis without myelopathy or radiculopathy, cervical region: Secondary | ICD-10-CM | POA: Diagnosis not present

## 2017-01-07 DIAGNOSIS — M25579 Pain in unspecified ankle and joints of unspecified foot: Secondary | ICD-10-CM | POA: Diagnosis not present

## 2017-01-07 DIAGNOSIS — M79672 Pain in left foot: Secondary | ICD-10-CM | POA: Diagnosis not present

## 2017-01-07 DIAGNOSIS — M79671 Pain in right foot: Secondary | ICD-10-CM | POA: Diagnosis not present

## 2017-01-07 DIAGNOSIS — M722 Plantar fascial fibromatosis: Secondary | ICD-10-CM | POA: Diagnosis not present

## 2017-01-10 NOTE — Progress Notes (Signed)
Cardiology Office Note  Date: 01/13/2017   ID: Reymundo, Winship 1940-11-29, MRN [id number]  PCP: Rory Percy, MD  Primary Cardiologist: Rozann Lesches, MD   Chief Complaint  Patient presents with  . Coronary Artery Disease    History of Present Illness: Hunter Townsend is a 76 y.o. male last seen in February. He presents for a routine follow-up visit. Reports no progressive angina symptoms on medical therapy. He has been doing some walking for exercise, usually goes indoors at [facility] given the high heat and humidity outside.  I reviewed his medications which are outlined below. He reports no specific changes. We are continuing with dual antiplatelet therapy, he does not report any significant bleeding problems.  He continues to follow with Dr. Nadara Mustard. He does not report any interval health changes.  Past Medical History:  Diagnosis Date  . Arthritis   . BPH (benign prostatic hyperplasia)   . CHF (congestive heart failure) ([place])   . Coronary atherosclerosis of native coronary artery    Multivessel, NSTEMI  s/p PTCA/DES to mid LAD 06/24/11,, known patent LIMA-LAD and occlusion of all vein grafts  . Hypertension   . Ischemic cardiomyopathy    EF 35-40% by cath 06/24/11 (EF 37% by nuclear stress test 06/2010).  . Myocardial infarction ([place]) 2013  . PAD (peripheral artery disease) (HCC)    Status post left SFA stent - Dr. Burt Knack    Past Surgical History:  Procedure Laterality Date  . BACK SURGERY     "cut me ~ 1/2inch to take sciatic nerve out of my right leg"  . CARDIAC CATHETERIZATION  03/28/2016  . CARDIAC CATHETERIZATION N/A 03/28/2016   Procedure: Left Heart Cath and Cors/Grafts Angiography;  Surgeon: Leonie Man, MD;  Location: [place] [facility];  Service: Cardiovascular;  Laterality: N/A;  . CATARACT EXTRACTION W/ INTRAOCULAR LENS  IMPLANT, BILATERAL    . CORONARY ANGIOPLASTY    . CORONARY ARTERY BYPASS GRAFT  1987   [place], [place] - LIMA to LAD, SVG to  RCA presumably  . LEFT HEART CATHETERIZATION WITH CORONARY/GRAFT ANGIOGRAM  06/24/2011   Procedure: LEFT HEART CATHETERIZATION WITH Beatrix Fetters;  Surgeon: Burnell Blanks, MD;  Location: [facility] CATH LAB;  Service: Cardiovascular;;  . LEFT HEART CATHETERIZATION WITH CORONARY/GRAFT ANGIOGRAM N/A 02/26/2013   Procedure: LEFT HEART CATHETERIZATION WITH Beatrix Fetters;  Surgeon: Blane Ohara, MD;  Location: [facility] CATH LAB;  Service: Cardiovascular;  Laterality: N/A;  . LOWER EXTREMITY ANGIOGRAM N/A 10/30/2011   Procedure: LOWER EXTREMITY ANGIOGRAM;  Surgeon: Sherren Mocha, MD;  Location: [facility] CATH LAB;  Service: Cardiovascular;  Laterality: N/A;  . PERCUTANEOUS CORONARY STENT INTERVENTION (PCI-S)  02/26/2013   Procedure: PERCUTANEOUS CORONARY STENT INTERVENTION (PCI-S);  Surgeon: Blane Ohara, MD;  Location: [facility] CATH LAB;  Service: Cardiovascular;;    Current Outpatient Prescriptions  Medication Sig Dispense Refill  . aspirin EC 81 MG tablet Take 81 mg by mouth daily.    . carvedilol (COREG) 12.5 MG tablet Take 1.5 tablets (18.75 mg total) by mouth 2 (two) times daily with a meal. 90 tablet 5  . clopidogrel (PLAVIX) 75 MG tablet Take 75 mg by mouth daily.    . furosemide (LASIX) 40 MG tablet Take 1 tablet (40 mg total) by mouth daily. 30 tablet 5  . gabapentin (NEURONTIN) 100 MG capsule Take 100 mg by mouth 3 (three) times daily.     Marland Kitchen LORazepam (ATIVAN) 1 MG tablet Take 1 mg by mouth 2 (two) times  daily as needed for anxiety (sleep).     . losartan (COZAAR) 50 MG tablet Take 1 tablet (50 mg total) by mouth daily. 30 tablet 6  . Multiple Vitamin (MULTIVITAMIN) tablet Take 1 tablet by mouth daily.    . nitroGLYCERIN (NITROSTAT) 0.4 MG SL tablet Place 1 tablet (0.4 mg total) under the tongue every 5 (five) minutes as needed. For chest pain 25 tablet 3  . Omega-3 Fatty Acids (FISH OIL) 1000 MG CAPS Take 2,000 mg by mouth daily.    . pantoprazole (PROTONIX) 40 MG tablet Take 40  mg by mouth daily.     . potassium chloride (K-DUR) 10 MEQ tablet TAKE 1 TABLET ONCE DAILY. 30 tablet 6  . ranolazine (RANEXA) 500 MG 12 hr tablet TAKE (1) TABLET TWICE DAILY. 180 tablet 1  . rosuvastatin (CRESTOR) 5 MG tablet Take 1 tablet (5 mg total) by mouth daily. 90 tablet 1   No current facility-administered medications for this visit.    Allergies:  Patient has no known allergies.   Social History: The patient  reports that he quit smoking about 23 years ago. His smoking use included Cigarettes. He has a 80.00 pack-year smoking history. He has never used smokeless tobacco. He reports that he does not drink alcohol or use drugs.   ROS:  Please see the history of present illness. Otherwise, complete review of systems is positive for chronic back pain.  All other systems are reviewed and negative.   Physical Exam: VS:  BP 128/64   Pulse 68   Ht 5\' 5"  (1.651 m)   Wt 168 lb (76.2 kg)   SpO2 98%   BMI 27.96 kg/m , BMI Body mass index is 27.96 kg/m.  Wt Readings from Last 3 Encounters:  01/13/17 168 lb (76.2 kg)  07/17/16 167 lb 12.8 oz (76.1 kg)  04/10/16 172 lb (78 kg)    Normally nourished appearing male, appears comfortable at rest.  HEENT: Conjunctiva and lids normal, oropharynx.  Neck: Supple, no elevated JVP or bruits. No thyromegaly.  Lungs: Clear auscultation, nonlabored.  Cardiac: Regular rate and rhythm, soft systolic murmur, split S2, no gallop.  Abdomen: Soft, nontender, bowel sounds present.  Skin: Warm and dry. Extremities: No pitting edema. Distal pulses 1+.  Musculoskeletal: No kyphosis.  Neuropsychiatric: Alert and oriented x3, affect appropriate.   ECG: I personally reviewed the tracing from 03/28/2016 which showed sinus rhythm with nonspecific ST changes.  Recent Labwork: 03/28/2016: ALT 17; AST 22; B Natriuretic Peptide 217.1; BUN 8; Creatinine, Ser 1.03; Hemoglobin 14.1; Platelets 268; Potassium 4.2; Sodium 138     Component Value Date/Time    CHOL 152 06/25/2011 0400   TRIG 150 (H) 06/25/2011 0400   HDL 28 (L) 06/25/2011 0400   CHOLHDL 5.4 06/25/2011 0400   VLDL 30 06/25/2011 0400   LDLCALC 94 06/25/2011 0400    Other Studies Reviewed Today:  Cardiac catheterization 03/28/2016:  Suspect Accelerated Hypertension with Acute on Chronic Combined Systolic and Diastolic Heart Failure As the Main Etiology for His Anginal Pain.  Ost RCA to Mid RCA lesion, 100 %stenosed.  Ost LAD to Mid LAD lesion, 100 %stenosed. Stented segment beyond graft insertion widely patent mild disease distally  Known occlusion of all 3 vein grafts. Not visualized.  Prox Cx lesion, 45 %stenosed. 2nd Mrg-1 lesion, 75 %stenosed. 2nd Mrg-2 lesion, 60 %stenosed. -- Angiographically maybe mild progression of disease since last catheterization  LIMA-LAD and is normal in caliber and large. Dist Graft stent, 40 percent in-stent  restenosis  There is severe left ventricular systolic dysfunction. The left ventricular ejection fraction is less than 25% by visual estimate.  LV end diastolic pressure is severely elevated - 36 mmHg  There is mild (2+) mitral regurgitation.  Accelerated Hypertension with Acute on Chronic Combined Systolic and Diastolic Heart Failure. -- Severely reduced LVEF with severely elevated LVEDP  Angiographically relatively stable coronary artery disease from last PCI of the LAD with moderate in-stent restenosis of the LIMA-LAD DES stent. Otherwise mild progression of disease in the circumflex system. Known occlusion of all vein grafts. The RCA fills via collaterals from the Circumflex episode which itself appears to be codominant. Stable diffuse disease in the Circumflex.  Echocardiogram 02/28/2016: Study Conclusions  - Left ventricle: The cavity size was at the upper limits of normal. Wall thickness was normal. Systolic function appeared mildly reduced in some views and moderately reduced in others. In summation, the estimated  ejection fraction was approximately 45%. Left ventricular diastolic function parameters were normal. - Regional wall motion abnormality: Akinesis of the mid anterior, mid anteroseptal, basal inferoseptal, and mid anterolateral myocardium; severe hypokinesis of the basal anteroseptal myocardium; moderate hypokinesis of the apical septal myocardium; mild hypokinesis of the mid inferoseptal and apical myocardium. - Aortic valve: Trileaflet; mildly thickened, mildly calcified leaflets. There was trivial regurgitation. - Mitral valve: There was moderate regurgitation. - Left atrium: The atrium was mildly dilated. - Right atrium: The atrium was mildly dilated. - Tricuspid valve: There was mild to moderate eccentric regurgitation. - Pulmonary arteries: PA peak pressure: 37 mm Hg (S).  Assessment and Plan:  1. Multivessel CAD status post CABG with graft disease, cardiac catheter is a from last year showed patent LIMA to the LAD and patent DES within the distal graft with only 40% in-stent restenosis. He is symptomatically stable on medical therapy including Ranexa. Plan to continue aspirin and Plavix.  2. Ischemic cardiomyopathy with LVEF 45%. No evidence of fluid overload on examination. Weight is stable.  3. Essential hypertension, blood pressure is adequately controlled today. Keep follow-up with Dr. Nadara Mustard.  4. Hyperlipidemia, continues on Crestor. Follows routine lab work with Dr. Nadara Mustard.  Current medicines were reviewed with the patient today.  Disposition: Follow-up in 6 months.  Signed, Satira Sark, MD, [facility] 01/13/2017 1:53 PM    [facility] at [place], [place], [place] [address] Phone: [phone]; Fax: [phone]

## 2017-01-13 ENCOUNTER — Encounter: Payer: Self-pay | Admitting: Cardiology

## 2017-01-13 ENCOUNTER — Ambulatory Visit (INDEPENDENT_AMBULATORY_CARE_PROVIDER_SITE_OTHER): Payer: Medicare Other | Admitting: Cardiology

## 2017-01-13 VITALS — BP 128/64 | HR 68 | Ht 65.0 in | Wt 168.0 lb

## 2017-01-13 DIAGNOSIS — E782 Mixed hyperlipidemia: Secondary | ICD-10-CM | POA: Diagnosis not present

## 2017-01-13 DIAGNOSIS — M9901 Segmental and somatic dysfunction of cervical region: Secondary | ICD-10-CM | POA: Diagnosis not present

## 2017-01-13 DIAGNOSIS — M47812 Spondylosis without myelopathy or radiculopathy, cervical region: Secondary | ICD-10-CM | POA: Diagnosis not present

## 2017-01-13 DIAGNOSIS — I10 Essential (primary) hypertension: Secondary | ICD-10-CM

## 2017-01-13 DIAGNOSIS — I255 Ischemic cardiomyopathy: Secondary | ICD-10-CM

## 2017-01-13 DIAGNOSIS — I25119 Atherosclerotic heart disease of native coronary artery with unspecified angina pectoris: Secondary | ICD-10-CM | POA: Diagnosis not present

## 2017-01-13 DIAGNOSIS — I209 Angina pectoris, unspecified: Secondary | ICD-10-CM | POA: Diagnosis not present

## 2017-01-13 DIAGNOSIS — M9902 Segmental and somatic dysfunction of thoracic region: Secondary | ICD-10-CM | POA: Diagnosis not present

## 2017-01-13 DIAGNOSIS — M9903 Segmental and somatic dysfunction of lumbar region: Secondary | ICD-10-CM | POA: Diagnosis not present

## 2017-01-13 DIAGNOSIS — M47816 Spondylosis without myelopathy or radiculopathy, lumbar region: Secondary | ICD-10-CM | POA: Diagnosis not present

## 2017-01-13 MED ORDER — CLOPIDOGREL BISULFATE 75 MG PO TABS: 75.0000 mg | ORAL_TABLET | Freq: Every day | ORAL | 1 refills | Status: DC

## 2017-01-13 NOTE — Addendum Note (Signed)
Addended by: Acquanetta Chain on: 01/13/2017 02:06 PM   Modules accepted: Orders

## 2017-01-13 NOTE — Patient Instructions (Signed)

## 2017-01-17 ENCOUNTER — Other Ambulatory Visit: Payer: Self-pay | Admitting: Cardiology

## 2017-01-21 DIAGNOSIS — M47816 Spondylosis without myelopathy or radiculopathy, lumbar region: Secondary | ICD-10-CM | POA: Diagnosis not present

## 2017-01-21 DIAGNOSIS — M9901 Segmental and somatic dysfunction of cervical region: Secondary | ICD-10-CM | POA: Diagnosis not present

## 2017-01-21 DIAGNOSIS — M47812 Spondylosis without myelopathy or radiculopathy, cervical region: Secondary | ICD-10-CM | POA: Diagnosis not present

## 2017-01-21 DIAGNOSIS — M9902 Segmental and somatic dysfunction of thoracic region: Secondary | ICD-10-CM | POA: Diagnosis not present

## 2017-01-21 DIAGNOSIS — M9903 Segmental and somatic dysfunction of lumbar region: Secondary | ICD-10-CM | POA: Diagnosis not present

## 2017-01-28 ENCOUNTER — Other Ambulatory Visit: Payer: Self-pay | Admitting: Cardiology

## 2017-01-28 DIAGNOSIS — M47816 Spondylosis without myelopathy or radiculopathy, lumbar region: Secondary | ICD-10-CM | POA: Diagnosis not present

## 2017-01-28 DIAGNOSIS — M9901 Segmental and somatic dysfunction of cervical region: Secondary | ICD-10-CM | POA: Diagnosis not present

## 2017-01-28 DIAGNOSIS — M47812 Spondylosis without myelopathy or radiculopathy, cervical region: Secondary | ICD-10-CM | POA: Diagnosis not present

## 2017-01-28 DIAGNOSIS — M9903 Segmental and somatic dysfunction of lumbar region: Secondary | ICD-10-CM | POA: Diagnosis not present

## 2017-01-28 DIAGNOSIS — M9902 Segmental and somatic dysfunction of thoracic region: Secondary | ICD-10-CM | POA: Diagnosis not present

## 2017-02-01 ENCOUNTER — Other Ambulatory Visit: Payer: Self-pay | Admitting: Cardiology

## 2017-02-04 DIAGNOSIS — M25579 Pain in unspecified ankle and joints of unspecified foot: Secondary | ICD-10-CM | POA: Diagnosis not present

## 2017-02-04 DIAGNOSIS — M79671 Pain in right foot: Secondary | ICD-10-CM | POA: Diagnosis not present

## 2017-02-04 DIAGNOSIS — M79672 Pain in left foot: Secondary | ICD-10-CM | POA: Diagnosis not present

## 2017-02-05 DIAGNOSIS — M47812 Spondylosis without myelopathy or radiculopathy, cervical region: Secondary | ICD-10-CM | POA: Diagnosis not present

## 2017-02-05 DIAGNOSIS — M9903 Segmental and somatic dysfunction of lumbar region: Secondary | ICD-10-CM | POA: Diagnosis not present

## 2017-02-05 DIAGNOSIS — M47816 Spondylosis without myelopathy or radiculopathy, lumbar region: Secondary | ICD-10-CM | POA: Diagnosis not present

## 2017-02-05 DIAGNOSIS — M9901 Segmental and somatic dysfunction of cervical region: Secondary | ICD-10-CM | POA: Diagnosis not present

## 2017-02-05 DIAGNOSIS — M9902 Segmental and somatic dysfunction of thoracic region: Secondary | ICD-10-CM | POA: Diagnosis not present

## 2017-02-10 ENCOUNTER — Other Ambulatory Visit: Payer: Self-pay | Admitting: Cardiology

## 2017-02-18 DIAGNOSIS — M47816 Spondylosis without myelopathy or radiculopathy, lumbar region: Secondary | ICD-10-CM | POA: Diagnosis not present

## 2017-02-18 DIAGNOSIS — Z6827 Body mass index (BMI) 27.0-27.9, adult: Secondary | ICD-10-CM | POA: Diagnosis not present

## 2017-02-19 DIAGNOSIS — M9902 Segmental and somatic dysfunction of thoracic region: Secondary | ICD-10-CM | POA: Diagnosis not present

## 2017-02-19 DIAGNOSIS — M9903 Segmental and somatic dysfunction of lumbar region: Secondary | ICD-10-CM | POA: Diagnosis not present

## 2017-02-19 DIAGNOSIS — M9901 Segmental and somatic dysfunction of cervical region: Secondary | ICD-10-CM | POA: Diagnosis not present

## 2017-02-19 DIAGNOSIS — M47812 Spondylosis without myelopathy or radiculopathy, cervical region: Secondary | ICD-10-CM | POA: Diagnosis not present

## 2017-02-19 DIAGNOSIS — M47816 Spondylosis without myelopathy or radiculopathy, lumbar region: Secondary | ICD-10-CM | POA: Diagnosis not present

## 2017-02-24 DIAGNOSIS — K219 Gastro-esophageal reflux disease without esophagitis: Secondary | ICD-10-CM | POA: Diagnosis not present

## 2017-02-24 DIAGNOSIS — Z23 Encounter for immunization: Secondary | ICD-10-CM | POA: Diagnosis not present

## 2017-02-24 DIAGNOSIS — I10 Essential (primary) hypertension: Secondary | ICD-10-CM | POA: Diagnosis not present

## 2017-02-24 DIAGNOSIS — Z6827 Body mass index (BMI) 27.0-27.9, adult: Secondary | ICD-10-CM | POA: Diagnosis not present

## 2017-02-25 ENCOUNTER — Encounter: Payer: Self-pay | Admitting: Internal Medicine

## 2017-03-04 DIAGNOSIS — M79671 Pain in right foot: Secondary | ICD-10-CM | POA: Diagnosis not present

## 2017-03-04 DIAGNOSIS — M25579 Pain in unspecified ankle and joints of unspecified foot: Secondary | ICD-10-CM | POA: Diagnosis not present

## 2017-03-04 DIAGNOSIS — M79672 Pain in left foot: Secondary | ICD-10-CM | POA: Diagnosis not present

## 2017-03-05 DIAGNOSIS — M47816 Spondylosis without myelopathy or radiculopathy, lumbar region: Secondary | ICD-10-CM | POA: Diagnosis not present

## 2017-03-05 DIAGNOSIS — M47812 Spondylosis without myelopathy or radiculopathy, cervical region: Secondary | ICD-10-CM | POA: Diagnosis not present

## 2017-03-05 DIAGNOSIS — M9903 Segmental and somatic dysfunction of lumbar region: Secondary | ICD-10-CM | POA: Diagnosis not present

## 2017-03-05 DIAGNOSIS — M9901 Segmental and somatic dysfunction of cervical region: Secondary | ICD-10-CM | POA: Diagnosis not present

## 2017-03-05 DIAGNOSIS — M9902 Segmental and somatic dysfunction of thoracic region: Secondary | ICD-10-CM | POA: Diagnosis not present

## 2017-03-27 ENCOUNTER — Ambulatory Visit (INDEPENDENT_AMBULATORY_CARE_PROVIDER_SITE_OTHER): Payer: Medicare Other | Admitting: Nurse Practitioner

## 2017-03-27 ENCOUNTER — Encounter: Payer: Self-pay | Admitting: Nurse Practitioner

## 2017-03-27 DIAGNOSIS — K219 Gastro-esophageal reflux disease without esophagitis: Secondary | ICD-10-CM | POA: Insufficient documentation

## 2017-03-27 DIAGNOSIS — I255 Ischemic cardiomyopathy: Secondary | ICD-10-CM | POA: Diagnosis not present

## 2017-03-27 MED ORDER — PANTOPRAZOLE SODIUM 40 MG PO TBEC: 40.0000 mg | DELAYED_RELEASE_TABLET | Freq: Two times a day (BID) | ORAL | 3 refills | Status: DC

## 2017-03-27 MED ORDER — SUCRALFATE 1 G PO TABS: 1.0000 g | ORAL_TABLET | Freq: Three times a day (TID) | ORAL | 3 refills | Status: DC

## 2017-03-27 NOTE — Patient Instructions (Signed)
1. Increase your Protonix to twice a day. I sent the change in your dosing to the pharmacy. 2. If you have breakthrough symptoms you can use Carafate tablets. Crush this up and mix it in a little bit of water and drink it. 3. Return for follow-up in 4 weeks. 4. Call us if you have any questions or concerns.

## 2017-03-27 NOTE — Assessment & Plan Note (Signed)
The patient has a history of GERD and earlier this year began having worsening symptoms. He has esophageal burning and a bitter taste in his mouth. Symptoms are worse at night, but do occur throughout the day. He is having hoarseness as well. He was on Protonix once a day. His primary care added ranitidine. At this point I'll have him stop the ranitidine, increase Protonix to twice a day, Carafate for breakthrough symptoms. Return for follow-up in 4 weeks and if remains symptomatic we can discuss possibility of need for EGD to further evaluate as well as check for H. pylori. I do not feel that he would likely tolerate 2 weeks off of his PPI at this point for H. pylori breath test.

## 2017-03-27 NOTE — Progress Notes (Signed)
Primary Care Physician:  Rory Percy, MD Primary Gastroenterologist:  Dr. Gala Romney  Chief Complaint  Patient presents with  . Hoarse    x summer time  . Gastroesophageal Reflux    HPI:   Hunter Townsend is a 76 y.o. male who presents on referral from primary care for onset of hoarseness which began about the same time is worsening reflux. This all started about 2 months prior. The patient was given a prescription of ranitidine by his primary care. No previous endoscopy found in our system.  Today he states he's doing ok. Had the flu in the summer and since then has had worsening "indigestion" and a hoarse voice. GERD symptoms include esophageal burning, bitter taste in his mouth. Occurs as often as every day but sometimes less. Eating late at night makes symptoms worse; tries to not eat after 6 pm and typically goes to be about 10 pm. Symptoms worse later at night. Denies abdominal pain. Has some nausea in conjunction with GERD flare, but no vomiting. Denies hematochezia, melena, unintentional weight loss, fever, chills. Has chronic constipation from medications and takes stool softener and intermittent suppositories which all cause his bowel movements to be pasty. Denies chest pain, dyspnea, dizziness, lightheadedness, syncope, near syncope. Denies any other upper or lower GI symptoms.  Has significant cardiac history and is scheduled for cardiology f/u in 1-2 months. Last OV 4 months ago was unremarkable ("I had a good checkup".)  Last colonoscopy was "a while ago" and has never had any polyps, told no further screening necessary. Takes Tylenol but denies NSAIDs, ASA powders.  Past Medical History:  Diagnosis Date  . Arthritis   . BPH (benign prostatic hyperplasia)   . CHF (congestive heart failure) ([place])   . Coronary atherosclerosis of native coronary artery    Multivessel, NSTEMI  s/p PTCA/DES to mid LAD 06/24/11,, known patent LIMA-LAD and occlusion of all vein grafts  .  Hypertension   . Ischemic cardiomyopathy    EF 35-40% by cath 06/24/11 (EF 37% by nuclear stress test 06/2010).  . Myocardial infarction ([place]) 2013  . PAD (peripheral artery disease) ([facility])    Status post left SFA stent - Dr. Burt Knack    Past Surgical History:  Procedure Laterality Date  . BACK SURGERY     "cut me ~ 1/2inch to take sciatic nerve out of my right leg"  . CARDIAC CATHETERIZATION  03/28/2016  . CARDIAC CATHETERIZATION N/A 03/28/2016   Procedure: Left Heart Cath and Cors/Grafts Angiography;  Surgeon: Leonie Man, MD;  Location: [place] [facility];  Service: Cardiovascular;  Laterality: N/A;  . CATARACT EXTRACTION W/ INTRAOCULAR LENS  IMPLANT, BILATERAL    . CORONARY ANGIOPLASTY    . CORONARY ARTERY BYPASS GRAFT  1987   [place], [place] - LIMA to LAD, SVG to RCA presumably  . LEFT HEART CATHETERIZATION WITH CORONARY/GRAFT ANGIOGRAM  06/24/2011   Procedure: LEFT HEART CATHETERIZATION WITH Beatrix Fetters;  Surgeon: Burnell Blanks, MD;  Location: [facility] CATH LAB;  Service: Cardiovascular;;  . LEFT HEART CATHETERIZATION WITH CORONARY/GRAFT ANGIOGRAM N/A 02/26/2013   Procedure: LEFT HEART CATHETERIZATION WITH Beatrix Fetters;  Surgeon: Blane Ohara, MD;  Location: [facility] CATH LAB;  Service: Cardiovascular;  Laterality: N/A;  . LOWER EXTREMITY ANGIOGRAM N/A 10/30/2011   Procedure: LOWER EXTREMITY ANGIOGRAM;  Surgeon: Sherren Mocha, MD;  Location: [facility] CATH LAB;  Service: Cardiovascular;  Laterality: N/A;  . PERCUTANEOUS CORONARY STENT INTERVENTION (PCI-S)  02/26/2013   Procedure: PERCUTANEOUS CORONARY STENT INTERVENTION (  PCI-S);  Surgeon: Blane Ohara, MD;  [facility] CATH LAB;  Service: Cardiovascular;;    Current Outpatient Prescriptions  Medication Sig Dispense Refill  . aspirin EC 81 MG tablet Take 81 mg by mouth daily.    . carvedilol (COREG) 12.5 MG tablet TAKE 1 & 1/2 TABLETS BY MOUTH TWICE DAILY WITH A MEAL (Patient taking differently: TAKE 1 TABLET BY  MOUTH TWICE DAILY WITH A MEAL) 90 tablet 6  . clopidogrel (PLAVIX) 75 MG tablet Take 1 tablet (75 mg total) by mouth daily. 90 tablet 1  . furosemide (LASIX) 40 MG tablet TAKE (1) TABLET BY MOUTH ONCE DAILY. 30 tablet 0  . gabapentin (NEURONTIN) 100 MG capsule Take 100 mg by mouth 3 (three) times daily.     Marland Kitchen LORazepam (ATIVAN) 1 MG tablet Take 1 mg by mouth 2 (two) times daily as needed for anxiety (sleep).     . losartan (COZAAR) 50 MG tablet Take 1 tablet (50 mg total) by mouth daily. 30 tablet 6  . Multiple Vitamin (MULTIVITAMIN) tablet Take 1 tablet by mouth daily.    . nitroGLYCERIN (NITROSTAT) 0.4 MG SL tablet Place 1 tablet (0.4 mg total) under the tongue every 5 (five) minutes as needed. For chest pain 25 tablet 3  . Omega-3 Fatty Acids (FISH OIL) 1000 MG CAPS Take 2,000 mg by mouth daily.    . pantoprazole (PROTONIX) 40 MG tablet Take 40 mg by mouth daily.     . potassium chloride (K-DUR) 10 MEQ tablet TAKE 1 TABLET ONCE DAILY. 30 tablet 6  . ranolazine (RANEXA) 500 MG 12 hr tablet TAKE (1) TABLET TWICE DAILY. 180 tablet 1  . rosuvastatin (CRESTOR) 5 MG tablet Take 1 tablet (5 mg total) by mouth daily. 90 tablet 1   No current facility-administered medications for this visit.     Allergies as of 03/27/2017  . (No Known Allergies)    Family History  Problem Relation Age of Onset  . CAD Unknown        pt. says he is unsure of the relationship  . Hypertension Unknown   . Colon cancer Neg Hx   . Gastric cancer Neg Hx   . Esophageal cancer Neg Hx     Social History   Social History  . Marital status: Widowed    Spouse name: N/A  . Number of children: N/A  . Years of education: N/A   Occupational History  . Retired     Orthoptist   Social History Main Topics  . Smoking status: Former Smoker    Packs/day: 2.00    Years: 40.00    Types: Cigarettes    Quit date: 05/27/1993  . Smokeless tobacco: Never Used  . Alcohol use No  . Drug use: No  . Sexual activity: Not  Currently   Other Topics Concern  . Not on file   Social History Narrative  . No narrative on file    Review of Systems: General: Negative for anorexia, weight loss, fever, chills, fatigue, weakness. ENT: Negative for hoarseness, difficulty swallowing. CV: Negative for chest pain, angina, palpitations, peripheral edema.  Respiratory: Negative for dyspnea at rest, cough, sputum, wheezing.  GI: See history of present illness. MS: Negative for joint pain, low back pain.  Derm: Negative for rash or itching.  Endo: Negative for unusual weight change.  Heme: Negative for bruising or bleeding. Allergy: Negative for rash or hives.    Physical Exam: BP 118/68   Pulse 94  Temp (!) 97.3 F (36.3 C) (Oral)   Ht 5\' 5"  (1.651 m)   Wt 167 lb 3.2 oz (75.8 kg)   BMI 27.82 kg/m  General:   Alert and oriented. Pleasant and cooperative. Well-nourished and well-developed.  Head:  Normocephalic and atraumatic. Eyes:  Without icterus, sclera clear and conjunctiva pink.  Ears:  Normal auditory acuity. Cardiovascular:  S1, S2 present without murmurs appreciated. Normal pulses noted. Extremities without clubbing or edema. Respiratory:  Clear to auscultation bilaterally. No wheezes, rales, or rhonchi. No distress.  Gastrointestinal:  +BS, soft, non-tender and non-distended. No HSM noted. No guarding or rebound. No masses appreciated.  Rectal:  Deferred  Musculoskalatal:  Symmetrical without gross deformities. Neurologic:  Alert and oriented x4;  grossly normal neurologically. Psych:  Alert and cooperative. Normal mood and affect. Heme/Lymph/Immune: No excessive bruising noted.    03/27/2017 9:00 AM   Disclaimer: This note was dictated with voice recognition software. Similar sounding words can inadvertently be transcribed and may not be corrected upon review.

## 2017-03-27 NOTE — Progress Notes (Signed)
CC'ED TO PCP 

## 2017-03-29 ENCOUNTER — Other Ambulatory Visit: Payer: Self-pay | Admitting: Cardiology

## 2017-04-08 DIAGNOSIS — M25579 Pain in unspecified ankle and joints of unspecified foot: Secondary | ICD-10-CM | POA: Diagnosis not present

## 2017-04-08 DIAGNOSIS — M79672 Pain in left foot: Secondary | ICD-10-CM | POA: Diagnosis not present

## 2017-04-08 DIAGNOSIS — M79671 Pain in right foot: Secondary | ICD-10-CM | POA: Diagnosis not present

## 2017-04-11 DIAGNOSIS — H35342 Macular cyst, hole, or pseudohole, left eye: Secondary | ICD-10-CM | POA: Diagnosis not present

## 2017-05-01 ENCOUNTER — Other Ambulatory Visit: Payer: Self-pay | Admitting: *Deleted

## 2017-05-01 MED ORDER — ROSUVASTATIN CALCIUM 5 MG PO TABS: 5.0000 mg | ORAL_TABLET | Freq: Every day | ORAL | 3 refills | Status: DC

## 2017-05-02 ENCOUNTER — Ambulatory Visit (INDEPENDENT_AMBULATORY_CARE_PROVIDER_SITE_OTHER): Payer: Medicare Other | Admitting: Nurse Practitioner

## 2017-05-02 ENCOUNTER — Encounter: Payer: Self-pay | Admitting: Nurse Practitioner

## 2017-05-02 VITALS — BP 101/60 | HR 69 | Temp 96.8°F | Ht 65.0 in | Wt 169.6 lb

## 2017-05-02 DIAGNOSIS — K219 Gastro-esophageal reflux disease without esophagitis: Secondary | ICD-10-CM

## 2017-05-02 DIAGNOSIS — I255 Ischemic cardiomyopathy: Secondary | ICD-10-CM

## 2017-05-02 DIAGNOSIS — R490 Dysphonia: Secondary | ICD-10-CM

## 2017-05-02 DIAGNOSIS — K5900 Constipation, unspecified: Secondary | ICD-10-CM

## 2017-05-02 NOTE — Assessment & Plan Note (Signed)
GERD symptoms significantly improved on twice a day dosing of Protonix.  Despite this his hoarseness is not improved.  At this point given his near resolution of GERD symptoms there is likely not a need for upper endoscopy.  We will plan to send him to ENT to further evaluate his hoarseness.  If they continue to feel it is due to reflux then we can plan for EGD at that time.  Return for follow-up in 6 months.

## 2017-05-02 NOTE — Assessment & Plan Note (Signed)
Chronic constipation which she feels is due to taking multiple medications.  He is not currently on any pain medication.  He has a bowel movement every 2-3 days.  He does not expect to have a bowel movement every day, but when he does have bowel movements or significant straining and hard stools.  He attempts to take over-the-counter stool softeners as well as intermittent laxative positive chores.  At this point will trial him on Linzess 72 mcg with samples for 2 weeks and request a progress report in 1-2 weeks.  Return for follow-up in 6 months otherwise.

## 2017-05-02 NOTE — Assessment & Plan Note (Signed)
Assistant hoarseness which is not improved despite significant improvement in her symptoms.  At this point continue current medications and refer the patient to ENT for further evaluation.  Return for follow-up here in 6 months.

## 2017-05-02 NOTE — Patient Instructions (Signed)
1. I am giving you samples of Linzess 72 mcg.  Take this once a day on an empty stomach. 2. We will give you samples to last 2 weeks.  Call us in 1-2 weeks and let us know if it is helping. 3. While you are taking this, do not take stool softeners and/or suppositories. 4. Continue your other current medications. 5. We will refer you to an ear nose and throat doctor. 6. Return for follow-up in 6 months. 7. Call us if you have any questions or concerns.

## 2017-05-02 NOTE — Progress Notes (Signed)
Referring Provider: Rory Percy, MD Primary Care Physician:  Rory Percy, MD Primary GI:  Dr. Gala Romney  Chief Complaint  Patient presents with  . Gastroesophageal Reflux    some better since not eating late at night  . Hoarse    HPI:   Hunter Townsend is a 76 y.o. male who presents for follow-up on GERD and hoarseness. The patient was last seen in our office 03/27/2017 for GERD.  At that time it was noted hoarseness began about 7 times worsening reflux about 2 months prior.  He was doing okay overall at his last visit.  Worsening indigestion during the summer including esophageal burning, bitter taste in his mouth as often as every day, but sometimes less.  Eating late at night makes symptoms worse tries not to eat after 6 PM and goes to bed typically at 10 PM.  No abdominal pain.  Some intermittent nausea in conjunction with GERD flare but no vomiting.  No other GI stools.  Chronic constipation from medications and take stool softener and intermittent suppositories with good effect.  Significant cardiac history with ongoing care by cardiology and follow-up scheduled for 1-2 months.  Last colonoscopy a while ago and per the patient no polyps with no further screening necessary.  Denies NSAIDs.  Commended increase Protonix to twice a day, use Carafate for breakthrough symptoms, follow-up in 4 weeks.  Consider EGD if no improvement in symptoms.  Consider workup for H. pylori on EGD if necessary, unlikely to tolerate 2 weeks off PPI for testing.  Today he states he's ok overall. GER much improved since he stopped eating late at night. On PPI bid. Rare breakthrough. Denies abdominal pain, N/V, hematochezia, melena, fever, chills, unintentional weight loss. Chronic constipation uses stool softeners and suppositories. Still with significant hoarseness without improvement; friends indicate it is no better. Denies chest pain, dyspnea, dizziness, lightheadedness, syncope, near syncope. Denies any other  upper or lower GI symptoms.  Past Medical History:  Diagnosis Date  . Arthritis   . BPH (benign prostatic hyperplasia)   . CHF (congestive heart failure) ([place])   . Coronary atherosclerosis of native coronary artery    Multivessel, NSTEMI  s/p PTCA/DES to mid LAD 06/24/11,, known patent LIMA-LAD and occlusion of all vein grafts  . Hypertension   . Ischemic cardiomyopathy    EF 35-40% by cath 06/24/11 (EF 37% by nuclear stress test 06/2010).  . Myocardial infarction ([place]) 2013  . PAD (peripheral artery disease) ([facility])    Status post left SFA stent - Dr. Burt Knack    Past Surgical History:  Procedure Laterality Date  . BACK SURGERY     "cut me ~ 1/2inch to take sciatic nerve out of my right leg"  . CARDIAC CATHETERIZATION  03/28/2016  . CARDIAC CATHETERIZATION N/A 03/28/2016   Procedure: Left Heart Cath and Cors/Grafts Angiography;  Surgeon: Leonie Man, MD;  Location: [place] [facility];  Service: Cardiovascular;  Laterality: N/A;  . CATARACT EXTRACTION W/ INTRAOCULAR LENS  IMPLANT, BILATERAL    . CORONARY ANGIOPLASTY    . CORONARY ARTERY BYPASS GRAFT  1987   [place], [place] - LIMA to LAD, SVG to RCA presumably  . LEFT HEART CATHETERIZATION WITH CORONARY/GRAFT ANGIOGRAM  06/24/2011   Procedure: LEFT HEART CATHETERIZATION WITH Beatrix Fetters;  Surgeon: Burnell Blanks, MD;  Location: [facility] CATH LAB;  Service: Cardiovascular;;  . LEFT HEART CATHETERIZATION WITH CORONARY/GRAFT ANGIOGRAM N/A 02/26/2013   Procedure: LEFT HEART CATHETERIZATION WITH Beatrix Fetters;  Surgeon: Legrand Como  Ree Kida, MD;  Location: [facility] CATH LAB;  Service: Cardiovascular;  Laterality: N/A;  . LOWER EXTREMITY ANGIOGRAM N/A 10/30/2011   Procedure: LOWER EXTREMITY ANGIOGRAM;  Surgeon: Sherren Mocha, MD;  Location: [facility] CATH LAB;  Service: Cardiovascular;  Laterality: N/A;  . PERCUTANEOUS CORONARY STENT INTERVENTION (PCI-S)  02/26/2013   Procedure: PERCUTANEOUS CORONARY STENT INTERVENTION (PCI-S);  Surgeon:  Blane Ohara, MD;  Location: [facility] CATH LAB;  Service: Cardiovascular;;    Current Outpatient Medications  Medication Sig Dispense Refill  . aspirin EC 81 MG tablet Take 81 mg by mouth daily.    . carvedilol (COREG) 12.5 MG tablet TAKE 1 & 1/2 TABLETS BY MOUTH TWICE DAILY WITH A MEAL (Patient taking differently: TAKE 1 TABLET BY MOUTH TWICE DAILY WITH A MEAL) 90 tablet 6  . clopidogrel (PLAVIX) 75 MG tablet Take 1 tablet (75 mg total) by mouth daily. 90 tablet 1  . furosemide (LASIX) 40 MG tablet TAKE (1) TABLET BY MOUTH ONCE DAILY. 30 tablet 0  . gabapentin (NEURONTIN) 100 MG capsule Take 100 mg by mouth 3 (three) times daily.     Marland Kitchen LORazepam (ATIVAN) 1 MG tablet Take 1 mg by mouth 2 (two) times daily as needed for anxiety (sleep).     . losartan (COZAAR) 50 MG tablet TAKE 1 TABLET ONCE DAILY. 30 tablet 6  . Multiple Vitamin (MULTIVITAMIN) tablet Take 1 tablet by mouth daily.    . nitroGLYCERIN (NITROSTAT) 0.4 MG SL tablet Place 1 tablet (0.4 mg total) under the tongue every 5 (five) minutes as needed. For chest pain 25 tablet 3  . Omega-3 Fatty Acids (FISH OIL) 1000 MG CAPS Take 2,000 mg by mouth daily.    . pantoprazole (PROTONIX) 40 MG tablet Take 1 tablet (40 mg total) by mouth 2 (two) times daily before a meal. 60 tablet 3  . potassium chloride (K-DUR) 10 MEQ tablet TAKE 1 TABLET ONCE DAILY. 30 tablet 6  . ranolazine (RANEXA) 500 MG 12 hr tablet TAKE (1) TABLET TWICE DAILY. 180 tablet 1  . rosuvastatin (CRESTOR) 5 MG tablet Take 1 tablet (5 mg total) by mouth daily. 90 tablet 3  . sucralfate (CARAFATE) 1 g tablet Take 1 tablet (1 g total) by mouth 4 (four) times daily -  with meals and at bedtime. AS NEEDED FOR BREAKTHROUGH GERD SYMPTOMS 30 tablet 3   No current facility-administered medications for this visit.     Allergies as of 05/02/2017  . (No Known Allergies)    Family History  Problem Relation Age of Onset  . CAD Unknown        pt. says he is unsure of the relationship    . Hypertension Unknown   . Colon cancer Neg Hx   . Gastric cancer Neg Hx   . Esophageal cancer Neg Hx     Social History   Socioeconomic History  . Marital status: Widowed    Spouse name: None  . Number of children: None  . Years of education: None  . Highest education level: None  Social Needs  . Financial resource strain: None  . Food insecurity - worry: None  . Food insecurity - inability: None  . Transportation needs - medical: None  . Transportation needs - non-medical: None  Occupational History  . Occupation: Retired    Comment: Orthoptist  Tobacco Use  . Smoking status: Former Smoker    Packs/day: 2.00    Years: 40.00    Pack years: 80.00    Types:  Cigarettes    Last attempt to quit: 05/27/1993    Years since quitting: 23.9  . Smokeless tobacco: Never Used  Substance and Sexual Activity  . Alcohol use: No    Alcohol/week: 0.0 oz  . Drug use: No  . Sexual activity: Not Currently  Other Topics Concern  . None  Social History Narrative  . None    Review of Systems: General: Negative for anorexia, weight loss, fever, chills, fatigue, weakness. ENT: Negative for difficulty swallowing. Continued hoarseness unchanged. CV: Negative for chest pain, angina, palpitations, peripheral edema.  Respiratory: Negative for dyspnea at rest, cough, sputum, wheezing.  GI: See history of present illness. Endo: Negative for unusual weight change.  Heme: Negative for bruising or bleeding.   Physical Exam: BP 101/60   Pulse 69   Temp (!) 96.8 F (36 C) (Oral)   Ht 5\' 5"  (1.651 m)   Wt 169 lb 9.6 oz (76.9 kg)   BMI 28.22 kg/m  General:   Alert and oriented. Pleasant and cooperative. Well-nourished and well-developed.  Eyes:  Without icterus, sclera clear and conjunctiva pink.  Ears:  Normal auditory acuity. Cardiovascular:  S1, S2 present without murmurs appreciated. Extremities without clubbing or edema. Respiratory:  Clear to auscultation bilaterally. No wheezes,  rales, or rhonchi. No distress.  Gastrointestinal:  +BS, soft, non-tender and non-distended. No HSM noted. No guarding or rebound. No masses appreciated.  Rectal:  Deferred  Musculoskalatal:  Symmetrical without gross deformities. Neurologic:  Alert and oriented x4;  grossly normal neurologically. Psych:  Alert and cooperative. Normal mood and affect. Heme/Lymph/Immune: No excessive bruising noted.    05/02/2017 11:37 AM   Disclaimer: This note was dictated with voice recognition software. Similar sounding words can inadvertently be transcribed and may not be corrected upon review.

## 2017-05-06 DIAGNOSIS — M79672 Pain in left foot: Secondary | ICD-10-CM | POA: Diagnosis not present

## 2017-05-06 DIAGNOSIS — M79671 Pain in right foot: Secondary | ICD-10-CM | POA: Diagnosis not present

## 2017-05-06 DIAGNOSIS — M25579 Pain in unspecified ankle and joints of unspecified foot: Secondary | ICD-10-CM | POA: Diagnosis not present

## 2017-05-07 ENCOUNTER — Other Ambulatory Visit: Payer: Self-pay | Admitting: *Deleted

## 2017-05-07 ENCOUNTER — Encounter: Payer: Self-pay | Admitting: Internal Medicine

## 2017-05-07 DIAGNOSIS — K219 Gastro-esophageal reflux disease without esophagitis: Secondary | ICD-10-CM

## 2017-05-07 DIAGNOSIS — R490 Dysphonia: Secondary | ICD-10-CM

## 2017-05-07 NOTE — Progress Notes (Signed)
CC'D TO PCP °

## 2017-05-09 ENCOUNTER — Telehealth: Payer: Self-pay | Admitting: Cardiology

## 2017-05-09 ENCOUNTER — Telehealth: Payer: Self-pay

## 2017-05-09 ENCOUNTER — Ambulatory Visit (INDEPENDENT_AMBULATORY_CARE_PROVIDER_SITE_OTHER): Payer: Medicare Other | Admitting: Cardiology

## 2017-05-09 ENCOUNTER — Other Ambulatory Visit: Payer: Self-pay | Admitting: Cardiology

## 2017-05-09 ENCOUNTER — Encounter: Payer: Self-pay | Admitting: Cardiology

## 2017-05-09 VITALS — BP 128/64 | HR 60 | Ht 65.0 in | Wt 167.0 lb

## 2017-05-09 DIAGNOSIS — E782 Mixed hyperlipidemia: Secondary | ICD-10-CM

## 2017-05-09 DIAGNOSIS — I200 Unstable angina: Secondary | ICD-10-CM

## 2017-05-09 DIAGNOSIS — I255 Ischemic cardiomyopathy: Secondary | ICD-10-CM | POA: Diagnosis not present

## 2017-05-09 DIAGNOSIS — I10 Essential (primary) hypertension: Secondary | ICD-10-CM

## 2017-05-09 NOTE — Progress Notes (Signed)
Cardiology Office Note  Date: 05/09/2017   ID: Hunter Townsend, DOB 1941/05/13, MRN [id number]  PCP: Rory Percy, MD  Primary Cardiologist: Rozann Lesches, MD   Chief Complaint  Patient presents with  . Coronary Artery Disease    History of Present Illness: Hunter Townsend is a 76 y.o. male last seen in August.  He presents today for a routine follow-up visit.  States that over the last few months he has been experiencing progressive dyspnea on exertion with lack of stamina and increasing angina symptoms despite compliance with medical therapy.  He has not been able to walk regularly for exercise due to this.  Cardiac catheterization from November of last year is outlined below.  Unfortunately, he does have significant graft disease with somewhat limited revascularization options.  DES intervention to the LAD and LIMA to LAD were patent with some degree of in-stent restenosis which we have managed medically.  Current cardiac regimen includes aspirin, Plavix, Coreg, Lasix, Cozaar, Ranexa, Crestor, potassium supplements, and as needed nitroglycerin.  Blood pressure and heart rate are well controlled today.  I personally reviewed his ECG today which shows sinus rhythm with PVCs and fusion beats, nonspecific ST-T changes.  Past Medical History:  Diagnosis Date  . Arthritis   . BPH (benign prostatic hyperplasia)   . CHF (congestive heart failure) ([place])   . Coronary atherosclerosis of native coronary artery    Multivessel, NSTEMI  s/p PTCA/DES to mid LAD 06/24/11,, known patent LIMA-LAD and occlusion of all vein grafts  . Hypertension   . Ischemic cardiomyopathy    EF 35-40% by cath 06/24/11 (EF 37% by nuclear stress test 06/2010).  . Myocardial infarction ([place]) 2013  . PAD (peripheral artery disease) (HCC)    Status post left SFA stent - Dr. Burt Knack    Past Surgical History:  Procedure Laterality Date  . BACK SURGERY     "cut me ~ 1/2inch to take sciatic nerve out of my  right leg"  . CARDIAC CATHETERIZATION  03/28/2016  . CARDIAC CATHETERIZATION N/A 03/28/2016   Procedure: Left Heart Cath and Cors/Grafts Angiography;  Surgeon: Leonie Man, MD;  Location: [facility];  Service: Cardiovascular;  Laterality: N/A;  . CATARACT EXTRACTION W/ INTRAOCULAR LENS  IMPLANT, BILATERAL    . CORONARY ANGIOPLASTY    . CORONARY ARTERY BYPASS GRAFT  1987   [place], [place] - LIMA to LAD, SVG to RCA presumably  . LEFT HEART CATHETERIZATION WITH CORONARY/GRAFT ANGIOGRAM  06/24/2011   Procedure: LEFT HEART CATHETERIZATION WITH Beatrix Fetters;  Surgeon: Burnell Blanks, MD;  Location: [facility] CATH LAB;  Service: Cardiovascular;;  . LEFT HEART CATHETERIZATION WITH CORONARY/GRAFT ANGIOGRAM N/A 02/26/2013   Procedure: LEFT HEART CATHETERIZATION WITH Beatrix Fetters;  Surgeon: Blane Ohara, MD;  Location: [facility] CATH LAB;  Service: Cardiovascular;  Laterality: N/A;  . LOWER EXTREMITY ANGIOGRAM N/A 10/30/2011   Procedure: LOWER EXTREMITY ANGIOGRAM;  Surgeon: Sherren Mocha, MD;  Location: [facility] CATH LAB;  Service: Cardiovascular;  Laterality: N/A;  . PERCUTANEOUS CORONARY STENT INTERVENTION (PCI-S)  02/26/2013   Procedure: PERCUTANEOUS CORONARY STENT INTERVENTION (PCI-S);  Surgeon: Blane Ohara, MD;  [facility]: [facility] CATH LAB;  Service: Cardiovascular;;    Current Outpatient Medications  Medication Sig Dispense Refill  . aspirin EC 81 MG tablet Take 81 mg by mouth daily.    . carvedilol (COREG) 12.5 MG tablet TAKE 1 & 1/2 TABLETS BY MOUTH TWICE DAILY WITH A MEAL (Patient taking differently: No sig reported) 90 tablet  6  . clopidogrel (PLAVIX) 75 MG tablet Take 1 tablet (75 mg total) by mouth daily. 90 tablet 1  . furosemide (LASIX) 40 MG tablet TAKE (1) TABLET BY MOUTH ONCE DAILY. 30 tablet 0  . gabapentin (NEURONTIN) 100 MG capsule Take 100 mg by mouth 3 (three) times daily.     Marland Kitchen LORazepam (ATIVAN) 1 MG tablet Take 1 mg by mouth 2 (two) times daily as needed for  anxiety (sleep).     . losartan (COZAAR) 50 MG tablet TAKE 1 TABLET ONCE DAILY. 30 tablet 6  . Multiple Vitamin (MULTIVITAMIN) tablet Take 1 tablet by mouth daily.    . nitroGLYCERIN (NITROSTAT) 0.4 MG SL tablet Place 1 tablet (0.4 mg total) under the tongue every 5 (five) minutes as needed. For chest pain 25 tablet 3  . Omega-3 Fatty Acids (FISH OIL) 1000 MG CAPS Take 2,000 mg by mouth daily.    . pantoprazole (PROTONIX) 40 MG tablet Take 1 tablet (40 mg total) by mouth 2 (two) times daily before a meal. 60 tablet 3  . potassium chloride (K-DUR) 10 MEQ tablet TAKE 1 TABLET ONCE DAILY. 30 tablet 6  . ranolazine (RANEXA) 500 MG 12 hr tablet TAKE (1) TABLET TWICE DAILY. 180 tablet 1  . rosuvastatin (CRESTOR) 5 MG tablet Take 1 tablet (5 mg total) by mouth daily. 90 tablet 3  . sucralfate (CARAFATE) 1 g tablet Take 1 tablet (1 g total) by mouth 4 (four) times daily -  with meals and at bedtime. AS NEEDED FOR BREAKTHROUGH GERD SYMPTOMS 30 tablet 3   No current facility-administered medications for this visit.    Allergies:  Patient has no known allergies.   Social History: The patient  reports that he quit smoking about 23 years ago. His smoking use included cigarettes. He has a 80.00 pack-year smoking history. he has never used smokeless tobacco. He reports that he does not drink alcohol or use drugs.   Family History: The patient's family history includes CAD in his unknown relative; Hypertension in his unknown relative.   ROS:  Please see the history of present illness. Otherwise, complete review of systems is positive for arthritic pains.  All other systems are reviewed and negative.   Physical Exam: VS:  BP 128/64   Pulse 60   Ht 5\' 5"  (1.651 m)   Wt 167 lb (75.8 kg)   SpO2 98%   BMI 27.79 kg/m , BMI Body mass index is 27.79 kg/m.  Wt Readings from Last 3 Encounters:  05/09/17 167 lb (75.8 kg)  05/02/17 169 lb 9.6 oz (76.9 kg)  03/27/17 167 lb 3.2 oz (75.8 kg)    General: Patient  appears comfortable at rest. HEENT: Conjunctiva and lids normal, oropharynx clear. Neck: Supple, no elevated JVP or carotid bruits, no thyromegaly. Lungs: Clear to auscultation, nonlabored breathing at rest. Cardiac: Regular rate and rhythm, no S3, soft systolic murmur, split S2. Abdomen: Soft, nontender, bowel sounds present, no guarding or rebound. Extremities: No pitting edema, distal pulses 2+. Skin: Warm and dry. Musculoskeletal: No kyphosis. Neuropsychiatric: Alert and oriented x3, affect grossly appropriate.  ECG: I personally reviewed the tracing from 03/28/2016 which showed sinus rhythm with rightward axis.  Recent Labwork:  November 2017: Cholesterol 146, triglycerides 113, HDL 30, LDL 93  Other Studies Reviewed Today:  Cardiac catheterization 03/28/2016:  Suspect Accelerated Hypertension with Acute on Chronic Combined Systolic and Diastolic Heart Failure As the Main Etiology for His Anginal Pain.  Ost RCA to Mid RCA lesion,  100 %stenosed.  Ost LAD to Mid LAD lesion, 100 %stenosed. Stented segment beyond graft insertion widely patent mild disease distally  Known occlusion of all 3 vein grafts. Not visualized.  Prox Cx lesion, 45 %stenosed. 2nd Mrg-1 lesion, 75 %stenosed. 2nd Mrg-2 lesion, 60 %stenosed. -- Angiographically maybe mild progression of disease since last catheterization  LIMA-LAD and is normal in caliber and large. Dist Graft stent, 40 percent in-stent restenosis  There is severe left ventricular systolic dysfunction. The left ventricular ejection fraction is less than 25% by visual estimate.  LV end diastolic pressure is severely elevated - 36 mmHg  There is mild (2+) mitral regurgitation.  Accelerated Hypertension with Acute on Chronic Combined Systolic and Diastolic Heart Failure. -- Severely reduced LVEF with severely elevated LVEDP  Angiographically relatively stable coronary artery disease from last PCI of the LAD with moderate in-stent restenosis  of the LIMA-LAD DES stent. Otherwise mild progression of disease in the circumflex system. Known occlusion of all vein grafts. The RCA fills via collaterals from the Circumflex episode which itself appears to be codominant. Stable diffuse disease in the Circumflex.  Echocardiogram 02/28/2016: Study Conclusions  - Left ventricle: The cavity size was at the upper limits of normal. Wall thickness was normal. Systolic function appeared mildly reduced in some views and moderately reduced in others. In summation, the estimated ejection fraction was approximately 45%. Left ventricular diastolic function parameters were normal. - Regional wall motion abnormality: Akinesis of the mid anterior, mid anteroseptal, basal inferoseptal, and mid anterolateral myocardium; severe hypokinesis of the basal anteroseptal myocardium; moderate hypokinesis of the apical septal myocardium; mild hypokinesis of the mid inferoseptal and apical myocardium. - Aortic valve: Trileaflet; mildly thickened, mildly calcified leaflets. There was trivial regurgitation. - Mitral valve: There was moderate regurgitation. - Left atrium: The atrium was mildly dilated. - Right atrium: The atrium was mildly dilated. - Tricuspid valve: There was mild to moderate eccentric regurgitation. - Pulmonary arteries: PA peak pressure: 37 mm Hg (S).  Assessment and Plan:  1.  Accelerating angina and exertional fatigue over the last few months.  Patient has multivessel CAD status post CABG with documented graft disease as well as DES interventions to the LAD and LIMA to LAD. He reports compliance with medical therapy, is on a good antianginal regimen with adequate heart rate and blood pressure control.  Plan is to proceed with a diagnostic cardiac catheterization to reevaluate for revascularization options, realizing that these may be limited.  He is in agreement to proceed.  2.  Ischemic cardiomyopathy with LVEF  approximately 45% by echocardiogram last year.  3.  Essential hypertension, blood pressure well controlled today.  4.  Mixed hyperlipidemia, continues on Crestor.  Follows with Dr. Nadara Mustard.  Current medicines were reviewed with the patient today.   Orders Placed This Encounter  Procedures  . EKG 12-Lead    Disposition: Follow-up after cardiac catheterization.  Signed, Satira Sark, MD, [facility] 05/09/2017 11:05 AM    [facility] at [place], [place], [place] [address] Phone: [phone]; Fax: [phone]

## 2017-05-09 NOTE — H&P (View-Only) (Signed)
Cardiology Office Note  Date: 05/09/2017   ID: Hunter Townsend, DOB Jan 01, 1941, MRN [id number]  PCP: Rory Percy, MD  Primary Cardiologist: Rozann Lesches, MD   Chief Complaint  Patient presents with  . Coronary Artery Disease    History of Present Illness: Hunter Townsend is a 76 y.o. male last seen in August.  He presents today for a routine follow-up visit.  States that over the last few months he has been experiencing progressive dyspnea on exertion with lack of stamina and increasing angina symptoms despite compliance with medical therapy.  He has not been able to walk regularly for exercise due to this.  Cardiac catheterization from November of last year is outlined below.  Unfortunately, he does have significant graft disease with somewhat limited revascularization options.  DES intervention to the LAD and LIMA to LAD were patent with some degree of in-stent restenosis which we have managed medically.  Current cardiac regimen includes aspirin, Plavix, Coreg, Lasix, Cozaar, Ranexa, Crestor, potassium supplements, and as needed nitroglycerin.  Blood pressure and heart rate are well controlled today.  I personally reviewed his ECG today which shows sinus rhythm with PVCs and fusion beats, nonspecific ST-T changes.  Past Medical History:  Diagnosis Date  . Arthritis   . BPH (benign prostatic hyperplasia)   . CHF (congestive heart failure) ([facility])   . Coronary atherosclerosis of native coronary artery    Multivessel, NSTEMI  s/p PTCA/DES to mid LAD 06/24/11,, known patent LIMA-LAD and occlusion of all vein grafts  . Hypertension   . Ischemic cardiomyopathy    EF 35-40% by cath 06/24/11 (EF 37% by nuclear stress test 06/2010).  . Myocardial infarction ([place]) 2013  . PAD (peripheral artery disease) (HCC)    Status post left SFA stent - Dr. Burt Knack    Past Surgical History:  Procedure Laterality Date  . BACK SURGERY     "cut me ~ 1/2inch to take sciatic nerve out of my  right leg"  . CARDIAC CATHETERIZATION  03/28/2016  . CARDIAC CATHETERIZATION N/A 03/28/2016   Procedure: Left Heart Cath and Cors/Grafts Angiography;  Surgeon: Leonie Man, MD;  Location: [place] [facility];  Service: Cardiovascular;  Laterality: N/A;  . CATARACT EXTRACTION W/ INTRAOCULAR LENS  IMPLANT, BILATERAL    . CORONARY ANGIOPLASTY    . CORONARY ARTERY BYPASS GRAFT  1987   [place], [place] - LIMA to LAD, SVG to RCA presumably  . LEFT HEART CATHETERIZATION WITH CORONARY/GRAFT ANGIOGRAM  06/24/2011   Procedure: LEFT HEART CATHETERIZATION WITH Beatrix Fetters;  Surgeon: Burnell Blanks, MD;  Location: [facility] CATH LAB;  Service: Cardiovascular;;  . LEFT HEART CATHETERIZATION WITH CORONARY/GRAFT ANGIOGRAM N/A 02/26/2013   Procedure: LEFT HEART CATHETERIZATION WITH Beatrix Fetters;  Surgeon: Blane Ohara, MD;  Location: [facility] CATH LAB;  Service: Cardiovascular;  Laterality: N/A;  . LOWER EXTREMITY ANGIOGRAM N/A 10/30/2011   Procedure: LOWER EXTREMITY ANGIOGRAM;  Surgeon: Sherren Mocha, MD;  Location: [facility] CATH LAB;  Service: Cardiovascular;  Laterality: N/A;  . PERCUTANEOUS CORONARY STENT INTERVENTION (PCI-S)  02/26/2013   Procedure: PERCUTANEOUS CORONARY STENT INTERVENTION (PCI-S);  Surgeon: Blane Ohara, MD;  Location: [facility] CATH LAB;  Service: Cardiovascular;;    Current Outpatient Medications  Medication Sig Dispense Refill  . aspirin EC 81 MG tablet Take 81 mg by mouth daily.    . carvedilol (COREG) 12.5 MG tablet TAKE 1 & 1/2 TABLETS BY MOUTH TWICE DAILY WITH A MEAL (Patient taking differently: No sig reported) 90 tablet  6  . clopidogrel (PLAVIX) 75 MG tablet Take 1 tablet (75 mg total) by mouth daily. 90 tablet 1  . furosemide (LASIX) 40 MG tablet TAKE (1) TABLET BY MOUTH ONCE DAILY. 30 tablet 0  . gabapentin (NEURONTIN) 100 MG capsule Take 100 mg by mouth 3 (three) times daily.     Marland Kitchen LORazepam (ATIVAN) 1 MG tablet Take 1 mg by mouth 2 (two) times daily as needed for  anxiety (sleep).     . losartan (COZAAR) 50 MG tablet TAKE 1 TABLET ONCE DAILY. 30 tablet 6  . Multiple Vitamin (MULTIVITAMIN) tablet Take 1 tablet by mouth daily.    . nitroGLYCERIN (NITROSTAT) 0.4 MG SL tablet Place 1 tablet (0.4 mg total) under the tongue every 5 (five) minutes as needed. For chest pain 25 tablet 3  . Omega-3 Fatty Acids (FISH OIL) 1000 MG CAPS Take 2,000 mg by mouth daily.    . pantoprazole (PROTONIX) 40 MG tablet Take 1 tablet (40 mg total) by mouth 2 (two) times daily before a meal. 60 tablet 3  . potassium chloride (K-DUR) 10 MEQ tablet TAKE 1 TABLET ONCE DAILY. 30 tablet 6  . ranolazine (RANEXA) 500 MG 12 hr tablet TAKE (1) TABLET TWICE DAILY. 180 tablet 1  . rosuvastatin (CRESTOR) 5 MG tablet Take 1 tablet (5 mg total) by mouth daily. 90 tablet 3  . sucralfate (CARAFATE) 1 g tablet Take 1 tablet (1 g total) by mouth 4 (four) times daily -  with meals and at bedtime. AS NEEDED FOR BREAKTHROUGH GERD SYMPTOMS 30 tablet 3   No current facility-administered medications for this visit.    Allergies:  Patient has no known allergies.   Social History: The patient  reports that he quit smoking about 23 years ago. His smoking use included cigarettes. He has a 80.00 pack-year smoking history. he has never used smokeless tobacco. He reports that he does not drink alcohol or use drugs.   Family History: The patient's family history includes CAD in his unknown relative; Hypertension in his unknown relative.   ROS:  Please see the history of present illness. Otherwise, complete review of systems is positive for arthritic pains.  All other systems are reviewed and negative.   Physical Exam: VS:  BP 128/64   Pulse 60   Ht 5\' 5"  (1.651 m)   Wt 167 lb (75.8 kg)   SpO2 98%   BMI 27.79 kg/m , BMI Body mass index is 27.79 kg/m.  Wt Readings from Last 3 Encounters:  05/09/17 167 lb (75.8 kg)  05/02/17 169 lb 9.6 oz (76.9 kg)  03/27/17 167 lb 3.2 oz (75.8 kg)    General: Patient  appears comfortable at rest. HEENT: Conjunctiva and lids normal, oropharynx clear. Neck: Supple, no elevated JVP or carotid bruits, no thyromegaly. Lungs: Clear to auscultation, nonlabored breathing at rest. Cardiac: Regular rate and rhythm, no S3, soft systolic murmur, split S2. Abdomen: Soft, nontender, bowel sounds present, no guarding or rebound. Extremities: No pitting edema, distal pulses 2+. Skin: Warm and dry. Musculoskeletal: No kyphosis. Neuropsychiatric: Alert and oriented x3, affect grossly appropriate.  ECG: I personally reviewed the tracing from 03/28/2016 which showed sinus rhythm with rightward axis.  Recent Labwork:  November 2017: Cholesterol 146, triglycerides 113, HDL 30, LDL 93  Other Studies Reviewed Today:  Cardiac catheterization 03/28/2016:  Suspect Accelerated Hypertension with Acute on Chronic Combined Systolic and Diastolic Heart Failure As the Main Etiology for His Anginal Pain.  Ost RCA to Mid RCA lesion,  100 %stenosed.  Ost LAD to Mid LAD lesion, 100 %stenosed. Stented segment beyond graft insertion widely patent mild disease distally  Known occlusion of all 3 vein grafts. Not visualized.  Prox Cx lesion, 45 %stenosed. 2nd Mrg-1 lesion, 75 %stenosed. 2nd Mrg-2 lesion, 60 %stenosed. -- Angiographically maybe mild progression of disease since last catheterization  LIMA-LAD and is normal in caliber and large. Dist Graft stent, 40 percent in-stent restenosis  There is severe left ventricular systolic dysfunction. The left ventricular ejection fraction is less than 25% by visual estimate.  LV end diastolic pressure is severely elevated - 36 mmHg  There is mild (2+) mitral regurgitation.  Accelerated Hypertension with Acute on Chronic Combined Systolic and Diastolic Heart Failure. -- Severely reduced LVEF with severely elevated LVEDP  Angiographically relatively stable coronary artery disease from last PCI of the LAD with moderate in-stent restenosis  of the LIMA-LAD DES stent. Otherwise mild progression of disease in the circumflex system. Known occlusion of all vein grafts. The RCA fills via collaterals from the Circumflex episode which itself appears to be codominant. Stable diffuse disease in the Circumflex.  Echocardiogram 02/28/2016: Study Conclusions  - Left ventricle: The cavity size was at the upper limits of normal. Wall thickness was normal. Systolic function appeared mildly reduced in some views and moderately reduced in others. In summation, the estimated ejection fraction was approximately 45%. Left ventricular diastolic function parameters were normal. - Regional wall motion abnormality: Akinesis of the mid anterior, mid anteroseptal, basal inferoseptal, and mid anterolateral myocardium; severe hypokinesis of the basal anteroseptal myocardium; moderate hypokinesis of the apical septal myocardium; mild hypokinesis of the mid inferoseptal and apical myocardium. - Aortic valve: Trileaflet; mildly thickened, mildly calcified leaflets. There was trivial regurgitation. - Mitral valve: There was moderate regurgitation. - Left atrium: The atrium was mildly dilated. - Right atrium: The atrium was mildly dilated. - Tricuspid valve: There was mild to moderate eccentric regurgitation. - Pulmonary arteries: PA peak pressure: 37 mm Hg (S).  Assessment and Plan:  1.  Accelerating angina and exertional fatigue over the last few months.  Patient has multivessel CAD status post CABG with documented graft disease as well as DES interventions to the LAD and LIMA to LAD. He reports compliance with medical therapy, is on a good antianginal regimen with adequate heart rate and blood pressure control.  Plan is to proceed with a diagnostic cardiac catheterization to reevaluate for revascularization options, realizing that these may be limited.  He is in agreement to proceed.  2.  Ischemic cardiomyopathy with LVEF  approximately 45% by echocardiogram last year.  3.  Essential hypertension, blood pressure well controlled today.  4.  Mixed hyperlipidemia, continues on Crestor.  Follows with Dr. Nadara Mustard.  Current medicines were reviewed with the patient today.   Orders Placed This Encounter  Procedures  . EKG 12-Lead    Disposition: Follow-up after cardiac catheterization.  Signed, Satira Sark, MD, [facility] 05/09/2017 11:05 AM    [place] at [address], [place], [place] [address] Phone: [phone]; Fax: [phone]

## 2017-05-09 NOTE — Telephone Encounter (Signed)
Patient contacted pre-catheterization at [facility] scheduled for:  05/12/2017 @ 1130 Verified arrival time and place:  [facility] @ 0930 Confirmed AM meds to be taken pre-cath with sip of water: Take ASA/Plavix Hold lasix Confirmed patient has responsible person to drive home post procedure and observe patient for 24 hours:  yes Addl concerns:  none

## 2017-05-09 NOTE — Telephone Encounter (Signed)
Pre-cert Verification for the following procedure   L heart cath with mcalhany on 12/17

## 2017-05-09 NOTE — Patient Instructions (Signed)
Medication Instructions:  Your physician recommends that you continue on your current medications as directed. Please refer to the Current Medication list given to you today.  Labwork: NONE  Testing/Procedures:   [name]  Hunter Townsend  05/09/2017  You are scheduled for a Cardiac Catheterization on Monday, December 17 with Dr. Lauree Townsend.  1. Please arrive at the [facility] (Main Entrance A) at [facility]: [address] [place], [place] [address] at 9:30 AM (two hours before your procedure to ensure your preparation). Free valet parking service is available.   Special note: Every effort is made to have your procedure done on time. Please understand that emergencies sometimes delay scheduled procedures.  2. Diet: Do not eat or drink anything after midnight prior to your procedure except sips of water to take medications.  3. Labs: Your labs will be performed at the hospital after you arrive for your procedure.  4. Medication instructions in preparation for your procedure:  On the morning of your procedure, take your Aspirin and any morning medicines NOT listed above.  You may use sips of water.  5. Plan for one night stay--bring personal belongings. 6. Bring a current list of your medications and current insurance cards. 7. You MUST have a responsible person to drive you home. 8. Someone MUST be with you the first 24 hours after you arrive home or your discharge will be delayed. 9. Please wear clothes that are easy to get on and off and wear slip-on shoes.  Thank you for allowing [place] to care for you!   -- [facility]  Follow-Up: Your physician recommends that you schedule a follow-up appointment in: [name]  Any Other Special  Instructions Will Be Listed Below (If Applicable).  If you need a refill on your cardiac medications before your next appointment, please call your pharmacy.

## 2017-05-12 ENCOUNTER — Encounter (HOSPITAL_COMMUNITY)
Admission: RE | Disposition: A | Discharge status: Home / Self Care | Payer: Self-pay | Source: Ambulatory Visit | Attending: Cardiovascular Disease

## 2017-05-12 ENCOUNTER — Ambulatory Visit (HOSPITAL_COMMUNITY)
Admission: RE | Admit: 2017-05-12 | Discharge: 2017-05-12 | Disposition: A | Discharge status: Home / Self Care | Payer: Medicare Other | Source: Ambulatory Visit | Attending: Cardiovascular Disease | Admitting: Cardiovascular Disease

## 2017-05-12 DIAGNOSIS — I200 Unstable angina: Secondary | ICD-10-CM

## 2017-05-12 DIAGNOSIS — N400 Enlarged prostate without lower urinary tract symptoms: Secondary | ICD-10-CM | POA: Diagnosis not present

## 2017-05-12 DIAGNOSIS — Z9841 Cataract extraction status, right eye: Secondary | ICD-10-CM | POA: Insufficient documentation

## 2017-05-12 DIAGNOSIS — Z7901 Long term (current) use of anticoagulants: Secondary | ICD-10-CM | POA: Insufficient documentation

## 2017-05-12 DIAGNOSIS — Z955 Presence of coronary angioplasty implant and graft: Secondary | ICD-10-CM | POA: Insufficient documentation

## 2017-05-12 DIAGNOSIS — I252 Old myocardial infarction: Secondary | ICD-10-CM | POA: Insufficient documentation

## 2017-05-12 DIAGNOSIS — I739 Peripheral vascular disease, unspecified: Secondary | ICD-10-CM | POA: Insufficient documentation

## 2017-05-12 DIAGNOSIS — E782 Mixed hyperlipidemia: Secondary | ICD-10-CM | POA: Diagnosis not present

## 2017-05-12 DIAGNOSIS — I25110 Atherosclerotic heart disease of native coronary artery with unstable angina pectoris: Secondary | ICD-10-CM | POA: Diagnosis not present

## 2017-05-12 DIAGNOSIS — Z79899 Other long term (current) drug therapy: Secondary | ICD-10-CM | POA: Insufficient documentation

## 2017-05-12 DIAGNOSIS — M1990 Unspecified osteoarthritis, unspecified site: Secondary | ICD-10-CM | POA: Diagnosis not present

## 2017-05-12 DIAGNOSIS — Z87891 Personal history of nicotine dependence: Secondary | ICD-10-CM | POA: Diagnosis not present

## 2017-05-12 DIAGNOSIS — R0600 Dyspnea, unspecified: Secondary | ICD-10-CM | POA: Insufficient documentation

## 2017-05-12 DIAGNOSIS — I5022 Chronic systolic (congestive) heart failure: Secondary | ICD-10-CM | POA: Diagnosis not present

## 2017-05-12 DIAGNOSIS — I255 Ischemic cardiomyopathy: Secondary | ICD-10-CM | POA: Insufficient documentation

## 2017-05-12 DIAGNOSIS — R5383 Other fatigue: Secondary | ICD-10-CM | POA: Diagnosis not present

## 2017-05-12 DIAGNOSIS — Z951 Presence of aortocoronary bypass graft: Secondary | ICD-10-CM | POA: Insufficient documentation

## 2017-05-12 DIAGNOSIS — Z8249 Family history of ischemic heart disease and other diseases of the circulatory system: Secondary | ICD-10-CM | POA: Insufficient documentation

## 2017-05-12 DIAGNOSIS — I110 Hypertensive heart disease with heart failure: Secondary | ICD-10-CM | POA: Insufficient documentation

## 2017-05-12 DIAGNOSIS — Z9842 Cataract extraction status, left eye: Secondary | ICD-10-CM | POA: Diagnosis not present

## 2017-05-12 HISTORY — PX: LEFT HEART CATH AND CORS/GRAFTS ANGIOGRAPHY: CATH118250

## 2017-05-12 LAB — PROTIME-INR
INR: 1.33
PROTHROMBIN TIME: 16.4 s — AB (ref 11.4–15.2)

## 2017-05-12 LAB — BASIC METABOLIC PANEL
ANION GAP: 6 (ref 5–15)
BUN: 13 mg/dL (ref 6–20)
CALCIUM: 9.4 mg/dL (ref 8.9–10.3)
CO2: 27 mmol/L (ref 22–32)
Chloride: 106 mmol/L (ref 101–111)
Creatinine, Ser: 1.27 mg/dL — ABNORMAL HIGH (ref 0.61–1.24)
GFR, EST NON AFRICAN AMERICAN: 52 mL/min — AB (ref >60)
Glucose, Bld: 106 mg/dL — ABNORMAL HIGH (ref 65–99)
Potassium: 4.6 mmol/L (ref 3.5–5.1)
Sodium: 139 mmol/L (ref 135–145)

## 2017-05-12 LAB — CBC
HCT: 36.3 % — ABNORMAL LOW (ref 39.0–52.0)
HEMOGLOBIN: 12.3 g/dL — AB (ref 13.0–17.0)
MCH: 32.6 pg (ref 26.0–34.0)
MCHC: 33.9 g/dL (ref 30.0–36.0)
MCV: 96.3 fL (ref 78.0–100.0)
Platelets: 285 10*3/uL (ref 150–400)
RBC: 3.77 MIL/uL — AB (ref 4.22–5.81)
RDW: 12.9 % (ref 11.5–15.5)
WBC: 6.5 10*3/uL (ref 4.0–10.5)

## 2017-05-12 SURGERY — LEFT HEART CATH AND CORS/GRAFTS ANGIOGRAPHY
Anesthesia: LOCAL

## 2017-05-12 MED ORDER — SODIUM CHLORIDE 0.9 % WEIGHT BASED INFUSION
3.0000 mL/kg/h | INTRAVENOUS | Status: AC
  Administered 2017-05-12: 3 mL/kg/h via INTRAVENOUS

## 2017-05-12 MED ORDER — IOPAMIDOL (ISOVUE-370) INJECTION 76%
INTRAVENOUS | Status: AC
  Filled 2017-05-12: qty 125

## 2017-05-12 MED ORDER — LIDOCAINE HCL (PF) 1 % IJ SOLN
INTRAMUSCULAR | Status: DC | PRN
  Administered 2017-05-12: 2 mL

## 2017-05-12 MED ORDER — FENTANYL CITRATE (PF) 100 MCG/2ML IJ SOLN
INTRAMUSCULAR | Status: AC
  Filled 2017-05-12: qty 2

## 2017-05-12 MED ORDER — SODIUM CHLORIDE 0.9% FLUSH: 3.0000 mL | INTRAVENOUS | Status: DC | PRN

## 2017-05-12 MED ORDER — SODIUM CHLORIDE 0.9 % IV SOLN: 250.0000 mL | INTRAVENOUS | Status: DC | PRN

## 2017-05-12 MED ORDER — SODIUM CHLORIDE 0.9% FLUSH: 3.0000 mL | Freq: Two times a day (BID) | INTRAVENOUS | Status: DC

## 2017-05-12 MED ORDER — MIDAZOLAM HCL 2 MG/2ML IJ SOLN
INTRAMUSCULAR | Status: AC
  Filled 2017-05-12: qty 2

## 2017-05-12 MED ORDER — ASPIRIN 81 MG PO CHEW
81.0000 mg | CHEWABLE_TABLET | ORAL | Status: DC
Start: 2017-05-13 — End: 2017-05-12

## 2017-05-12 MED ORDER — MIDAZOLAM HCL 2 MG/2ML IJ SOLN
INTRAMUSCULAR | Status: DC | PRN
  Administered 2017-05-12: 1 mg via INTRAVENOUS

## 2017-05-12 MED ORDER — FENTANYL CITRATE (PF) 100 MCG/2ML IJ SOLN
INTRAMUSCULAR | Status: DC | PRN
  Administered 2017-05-12: 25 ug via INTRAVENOUS

## 2017-05-12 MED ORDER — SODIUM CHLORIDE 0.9 % WEIGHT BASED INFUSION
1.0000 mL/kg/h | INTRAVENOUS | Status: DC
Start: 2017-05-12 — End: 2017-05-12

## 2017-05-12 MED ORDER — HEPARIN SODIUM (PORCINE) 1000 UNIT/ML IJ SOLN
INTRAMUSCULAR | Status: AC
  Filled 2017-05-12: qty 1

## 2017-05-12 MED ORDER — HEPARIN (PORCINE) IN NACL 2-0.9 UNIT/ML-% IJ SOLN
INTRAMUSCULAR | Status: AC
Start: 2017-05-12 — End: ?
  Filled 2017-05-12: qty 1000

## 2017-05-12 MED ORDER — HEPARIN (PORCINE) IN NACL 2-0.9 UNIT/ML-% IJ SOLN
INTRAMUSCULAR | Status: DC | PRN
  Administered 2017-05-12: 10 mL via INTRA_ARTERIAL

## 2017-05-12 MED ORDER — VERAPAMIL HCL 2.5 MG/ML IV SOLN
INTRAVENOUS | Status: AC
  Filled 2017-05-12: qty 2

## 2017-05-12 MED ORDER — IOPAMIDOL (ISOVUE-370) INJECTION 76%
INTRAVENOUS | Status: DC | PRN
  Administered 2017-05-12: 90 mL via INTRA_ARTERIAL

## 2017-05-12 MED ORDER — LIDOCAINE HCL (PF) 1 % IJ SOLN
INTRAMUSCULAR | Status: AC
Start: 2017-05-12 — End: ?
  Filled 2017-05-12: qty 30

## 2017-05-12 MED ORDER — HEPARIN SODIUM (PORCINE) 1000 UNIT/ML IJ SOLN
INTRAMUSCULAR | Status: DC | PRN
  Administered 2017-05-12: 4000 [IU] via INTRAVENOUS

## 2017-05-12 MED ORDER — SODIUM CHLORIDE 0.9 % IV SOLN: INTRAVENOUS | Status: AC

## 2017-05-12 MED ORDER — HEPARIN (PORCINE) IN NACL 2-0.9 UNIT/ML-% IJ SOLN
INTRAMUSCULAR | Status: AC | PRN
  Administered 2017-05-12: 1000 mL

## 2017-05-12 SURGICAL SUPPLY — 10 items
CATH INFINITI 5 FR IM (CATHETERS) ×1 IMPLANT
CATH INFINITI 5FR MULTPACK ANG (CATHETERS) ×1 IMPLANT
DEVICE RAD COMP TR BAND LRG (VASCULAR PRODUCTS) ×1 IMPLANT
GLIDESHEATH SLEND SS 6F .021 (SHEATH) ×1 IMPLANT
GUIDEWIRE INQWIRE 1.5J.035X260 (WIRE) IMPLANT
INQWIRE 1.5J .035X260CM (WIRE) ×2
KIT HEART LEFT (KITS) ×2 IMPLANT
PACK CARDIAC CATHETERIZATION (CUSTOM PROCEDURE TRAY) ×2 IMPLANT
TRANSDUCER W/STOPCOCK (MISCELLANEOUS) ×2 IMPLANT
TUBING CIL FLEX 10 FLL-RA (TUBING) ×2 IMPLANT

## 2017-05-12 NOTE — Discharge Instructions (Signed)
Radial Site Care °Refer to this sheet in the next few weeks. These instructions provide you with information about caring for yourself after your procedure. Your health care provider may also give you more specific instructions. Your treatment has been planned according to current medical practices, but problems sometimes occur. Call your health care provider if you have any problems or questions after your procedure. °What can I expect after the procedure? °After your procedure, it is typical to have the following: °· Bruising at the radial site that usually fades within 1-2 weeks. °· Blood collecting in the tissue (hematoma) that may be painful to the touch. It should usually decrease in size and tenderness within 1-2 weeks. ° °Follow these instructions at home: °· Take medicines only as directed by your health care provider. °· You may shower 24-48 hours after the procedure or as directed by your health care provider. Remove the bandage (dressing) and gently wash the site with plain soap and water. Pat the area dry with a clean towel. Do not rub the site, because this may cause bleeding. °· Do not take baths, swim, or use a hot tub until your health care provider approves. °· Check your insertion site every day for redness, swelling, or drainage. °· Do not apply powder or lotion to the site. °· Do not flex or bend the affected arm for 24 hours or as directed by your health care provider. °· Do not push or pull heavy objects with the affected arm for 24 hours or as directed by your health care provider. °· Do not lift over 10 lb (4.5 kg) for 5 days after your procedure or as directed by your health care provider. °· Ask your health care provider when it is okay to: °? Return to work or school. °? Resume usual physical activities or sports. °? Resume sexual activity. °· Do not drive home if you are discharged the same day as the procedure. Have someone else drive you. °· You may drive 24 hours after the procedure  unless otherwise instructed by your health care provider. °· Do not operate machinery or power tools for 24 hours after the procedure. °· If your procedure was done as an outpatient procedure, which means that you went home the same day as your procedure, a responsible adult should be with you for the first 24 hours after you arrive home. °· Keep all follow-up visits as directed by your health care provider. This is important. °Contact a health care provider if: °· You have a fever. °· You have chills. °· You have increased bleeding from the radial site. Hold pressure on the site. °Get help right away if: °· You have unusual pain at the radial site. °· You have redness, warmth, or swelling at the radial site. °· You have drainage (other than a small amount of blood on the dressing) from the radial site. °· The radial site is bleeding, and the bleeding does not stop after 30 minutes of holding steady pressure on the site. °· Your arm or hand becomes pale, cool, tingly, or numb. °This information is not intended to replace advice given to you by your health care provider. Make sure you discuss any questions you have with your health care provider. °Document Released: 06/15/2010 Document Revised: 10/19/2015 Document Reviewed: 11/29/2013 °Elsevier Interactive Patient Education © 2018 [employer] ° ° ° °Moderate Conscious Sedation, Adult, Care After °These instructions provide you with information about caring for yourself after your procedure. Your health care provider   may also give you more specific instructions. Your treatment has been planned according to current medical practices, but problems sometimes occur. Call your health care provider if you have any problems or questions after your procedure. °What can I expect after the procedure? °After your procedure, it is common: °· To feel sleepy for several hours. °· To feel clumsy and have poor balance for several hours. °· To have poor judgment for several  hours. °· To vomit if you eat too soon. ° °Follow these instructions at home: °For at least 24 hours after the procedure: ° °· Do not: °? Participate in activities where you could fall or become injured. °? Drive. °? Use heavy machinery. °? Drink alcohol. °? Take sleeping pills or medicines that cause drowsiness. °? Make important decisions or sign legal documents. °? Take care of children on your own. °· Rest. °Eating and drinking °· Follow the diet recommended by your health care provider. °· If you vomit: °? Drink water, juice, or soup when you can drink without vomiting. °? Make sure you have little or no nausea before eating solid foods. °General instructions °· Have a responsible adult stay with you until you are awake and alert. °· Take over-the-counter and prescription medicines only as told by your health care provider. °· If you smoke, do not smoke without supervision. °· Keep all follow-up visits as told by your health care provider. This is important. °Contact a health care provider if: °· You keep feeling nauseous or you keep vomiting. °· You feel light-headed. °· You develop a rash. °· You have a fever. °Get help right away if: °· You have trouble breathing. °This information is not intended to replace advice given to you by your health care provider. Make sure you discuss any questions you have with your health care provider. °Document Released: 03/03/2013 Document Revised: 10/16/2015 Document Reviewed: 09/02/2015 °Elsevier Interactive Patient Education © 2018 [employer] ° °

## 2017-05-12 NOTE — Interval H&P Note (Signed)
History and Physical Interval Note:  05/12/2017 12:17 PM  Hunter Townsend  has presented today for cardiac cath with the diagnosis of unstable angina  The various methods of treatment have been discussed with the patient and family. After consideration of risks, benefits and other options for treatment, the patient has consented to  Procedure(s): LEFT HEART CATH AND CORS/GRAFTS ANGIOGRAPHY (N/A) as a surgical intervention .  The patient's history has been reviewed, patient examined, no change in status, stable for surgery.  I have reviewed the patient's chart and labs.  Questions were answered to the patient's satisfaction.    Cath Lab Visit (complete for each Cath Lab visit)  Clinical Evaluation Leading to the Procedure:   ACS: No.  Non-ACS:    Anginal Classification: CCS III  Anti-ischemic medical therapy: Maximal Therapy (2 or more classes of medications)  Non-Invasive Test Results: No non-invasive testing performed  Prior CABG: Previous CABG         Lauree Chandler

## 2017-05-13 ENCOUNTER — Other Ambulatory Visit: Payer: Self-pay | Admitting: Cardiology

## 2017-05-13 ENCOUNTER — Encounter (HOSPITAL_COMMUNITY): Payer: Self-pay | Admitting: Cardiovascular Disease

## 2017-05-13 ENCOUNTER — Telehealth: Payer: Self-pay | Admitting: Internal Medicine

## 2017-05-13 NOTE — Telephone Encounter (Signed)
Pt has used his samples of Linzess 72 and they worked well for him. He would like a prescription called in at [facility] in [place]

## 2017-05-13 NOTE — Telephone Encounter (Signed)
Pt would like an rx of Linzess 15mcg. Linzess is working well for pt.

## 2017-05-14 MED ORDER — LINACLOTIDE 72 MCG PO CAPS: 72.0000 ug | ORAL_CAPSULE | Freq: Every day | ORAL | 11 refills | Status: DC

## 2017-05-14 NOTE — Addendum Note (Signed)
Addended by: Mahala Menghini on: 05/14/2017 03:43 PM   Modules accepted: Orders

## 2017-05-14 NOTE — Telephone Encounter (Signed)
rx done

## 2017-05-24 ENCOUNTER — Other Ambulatory Visit: Payer: Self-pay | Admitting: Cardiology

## 2017-05-26 DIAGNOSIS — Z6828 Body mass index (BMI) 28.0-28.9, adult: Secondary | ICD-10-CM | POA: Diagnosis not present

## 2017-05-26 DIAGNOSIS — R05 Cough: Secondary | ICD-10-CM | POA: Diagnosis not present

## 2017-05-26 DIAGNOSIS — J069 Acute upper respiratory infection, unspecified: Secondary | ICD-10-CM | POA: Diagnosis not present

## 2017-05-26 DIAGNOSIS — J019 Acute sinusitis, unspecified: Secondary | ICD-10-CM | POA: Diagnosis not present

## 2017-05-30 ENCOUNTER — Ambulatory Visit (INDEPENDENT_AMBULATORY_CARE_PROVIDER_SITE_OTHER): Payer: Medicare Other | Admitting: Cardiology

## 2017-05-30 ENCOUNTER — Encounter: Payer: Self-pay | Admitting: *Deleted

## 2017-05-30 ENCOUNTER — Other Ambulatory Visit: Payer: Self-pay

## 2017-05-30 VITALS — BP 109/68 | HR 76 | Ht 65.0 in | Wt 169.6 lb

## 2017-05-30 DIAGNOSIS — I10 Essential (primary) hypertension: Secondary | ICD-10-CM

## 2017-05-30 DIAGNOSIS — E782 Mixed hyperlipidemia: Secondary | ICD-10-CM

## 2017-05-30 DIAGNOSIS — I209 Angina pectoris, unspecified: Secondary | ICD-10-CM

## 2017-05-30 DIAGNOSIS — I255 Ischemic cardiomyopathy: Secondary | ICD-10-CM | POA: Diagnosis not present

## 2017-05-30 DIAGNOSIS — I25119 Atherosclerotic heart disease of native coronary artery with unspecified angina pectoris: Secondary | ICD-10-CM

## 2017-05-30 NOTE — Progress Notes (Signed)
Cardiology Office Note  Date: 05/30/2017   ID: RYNE MCTIGUE, DOB Mar 19, 1941, MRN [id number]  PCP: Rory Percy, MD  Primary Cardiologist: Rozann Lesches, MD   Chief Complaint  Patient presents with  . Coronary Artery Disease    History of Present Illness: Hunter Townsend is a 77 y.o. male last seen in December 2018. He was referred for a cardiac catheterization in light of progressive angina symptoms on medical therapy. Procedure was performed by Dr. Angelena Form as detailed below with recommendation to continue medical therapy in light of fairly diffuse disease and no focal PCI targets.  He presents today for follow-up. We went over the results of his cardiac catheterization as well as his current medical therapy which is fairly well optimized. Blood pressure and heart rate are well controlled. We discussed trying to maintain regular activity and basic ADLs, pacing himself as needed. Main limitation is shortness of breath activity which he is able to work through sometimes.  Past Medical History:  Diagnosis Date  . Arthritis   . BPH (benign prostatic hyperplasia)   . CHF (congestive heart failure) ([place])   . Coronary atherosclerosis of native coronary artery    Multivessel, NSTEMI  s/p PTCA/DES to mid LAD 06/24/11,, known patent LIMA-LAD and occlusion of all vein grafts  . Hypertension   . Ischemic cardiomyopathy    EF 35-40% by cath 06/24/11 (EF 37% by nuclear stress test 06/2010).  . Myocardial infarction ([place]) 2013  . PAD (peripheral artery disease) ([facility])    Status post left SFA stent - Dr. Burt Knack    Past Surgical History:  Procedure Laterality Date  . BACK SURGERY     "cut me ~ 1/2inch to take sciatic nerve out of my right leg"  . CARDIAC CATHETERIZATION  03/28/2016  . CARDIAC CATHETERIZATION N/A 03/28/2016   Procedure: Left Heart Cath and Cors/Grafts Angiography;  Surgeon: Leonie Man, MD;  Location: [facility];  Service: Cardiovascular;  Laterality: N/A;    . CATARACT EXTRACTION W/ INTRAOCULAR LENS  IMPLANT, BILATERAL    . CORONARY ANGIOPLASTY    . CORONARY ARTERY BYPASS GRAFT  1987   [place], [place] - LIMA to LAD, SVG to RCA presumably  . LEFT HEART CATH AND CORS/GRAFTS ANGIOGRAPHY N/A 05/12/2017   Procedure: LEFT HEART CATH AND CORS/GRAFTS ANGIOGRAPHY;  Surgeon: Burnell Blanks, MD;  Location: [facility];  Service: Cardiovascular;  Laterality: N/A;  . LEFT HEART CATHETERIZATION WITH CORONARY/GRAFT ANGIOGRAM  06/24/2011   Procedure: LEFT HEART CATHETERIZATION WITH Beatrix Fetters;  Surgeon: Burnell Blanks, MD;  Location: [facility] CATH LAB;  Service: Cardiovascular;;  . LEFT HEART CATHETERIZATION WITH CORONARY/GRAFT ANGIOGRAM N/A 02/26/2013   Procedure: LEFT HEART CATHETERIZATION WITH Beatrix Fetters;  Surgeon: Blane Ohara, MD;  Location: [facility] CATH LAB;  Service: Cardiovascular;  Laterality: N/A;  . LOWER EXTREMITY ANGIOGRAM N/A 10/30/2011   Procedure: LOWER EXTREMITY ANGIOGRAM;  Surgeon: Sherren Mocha, MD;  Location: [facility] CATH LAB;  Service: Cardiovascular;  Laterality: N/A;  . PERCUTANEOUS CORONARY STENT INTERVENTION (PCI-S)  02/26/2013   Procedure: PERCUTANEOUS CORONARY STENT INTERVENTION (PCI-S);  Surgeon: Blane Ohara, MD;  Location: [facility] CATH LAB;  Service: Cardiovascular;;    Current Outpatient Medications  Medication Sig Dispense Refill  . acetaminophen (TYLENOL) 325 MG tablet Take 650 mg by mouth every 6 (six) hours as needed for moderate pain or headache.    Marland Kitchen aspirin EC 81 MG tablet Take 81 mg by mouth daily.    . carvedilol (  COREG) 12.5 MG tablet TAKE 1 & 1/2 TABLETS BY MOUTH TWICE DAILY WITH A MEAL (Patient taking differently: Take 18.75 mg by mouth twice daily) 90 tablet 6  . clopidogrel (PLAVIX) 75 MG tablet Take 1 tablet (75 mg total) by mouth daily. 90 tablet 1  . furosemide (LASIX) 40 MG tablet TAKE 1 TABLET ONCE DAILY. 30 tablet 3  . gabapentin (NEURONTIN) 100 MG capsule Take 100 mg by mouth 3  (three) times daily.     Marland Kitchen LORazepam (ATIVAN) 1 MG tablet Take 1 mg by mouth 2 (two) times daily as needed for anxiety (sleep).     . losartan (COZAAR) 50 MG tablet TAKE 1 TABLET ONCE DAILY. (Patient taking differently: Take 50 mg by mouth once daily) 30 tablet 6  . Multiple Vitamin (MULTIVITAMIN) tablet Take 1 tablet by mouth daily.    . nitroGLYCERIN (NITROSTAT) 0.4 MG SL tablet Place 1 tablet (0.4 mg total) under the tongue every 5 (five) minutes as needed. For chest pain 25 tablet 3  . Omega-3 Fatty Acids (FISH OIL) 1000 MG CAPS Take 2,000 mg by mouth daily.    . pantoprazole (PROTONIX) 40 MG tablet Take 1 tablet (40 mg total) by mouth 2 (two) times daily before a meal. 60 tablet 3  . potassium chloride (K-DUR) 10 MEQ tablet TAKE 1 TABLET ONCE DAILY. 30 tablet 6  . ranolazine (RANEXA) 500 MG 12 hr tablet TAKE (1) TABLET TWICE DAILY. (Patient taking differently: Take 500 mg by mouth 2 (two) times daily. ) 180 tablet 1  . rosuvastatin (CRESTOR) 5 MG tablet Take 1 tablet (5 mg total) by mouth daily. 90 tablet 3   No current facility-administered medications for this visit.    Allergies:  Patient has no known allergies.   Social History: The patient  reports that he quit smoking about 24 years ago. His smoking use included cigarettes. He has a 80.00 pack-year smoking history. he has never used smokeless tobacco. He reports that he does not drink alcohol or use drugs.   ROS:  Please see the history of present illness. Otherwise, complete review of systems is positive for none.  All other systems are reviewed and negative.   Physical Exam: VS:  BP 109/68   Pulse 76   Ht 5\' 5"  (1.651 m)   Wt 169 lb 9.6 oz (76.9 kg)   BMI 28.22 kg/m , BMI Body mass index is 28.22 kg/m.  Wt Readings from Last 3 Encounters:  05/30/17 169 lb 9.6 oz (76.9 kg)  05/12/17 160 lb (72.6 kg)  05/09/17 167 lb (75.8 kg)    General: Patient appears comfortable at rest. HEENT: Conjunctiva and lids normal, oropharynx  clear. Neck: Supple, no elevated JVP or carotid bruits, no thyromegaly. Lungs: Clear to auscultation, nonlabored breathing at rest. Cardiac: Regular rate and rhythm, no S3, soft systolic murmur, no pericardial rub. Abdomen: Soft, nontender, bowel sounds present, no guarding or rebound. Extremities: No pitting edema, distal pulses 2+. Skin: Warm and dry. Musculoskeletal: No kyphosis. Neuropsychiatric: Alert and oriented x3, affect grossly appropriate.  ECG: I personally reviewed the tracing from 03/28/2016 which showed sinus rhythm with rightward axis.  Recent Labwork: 05/12/2017: BUN 13; Creatinine, Ser 1.27; Hemoglobin 12.3; Platelets 285; Potassium 4.6; Sodium 139     Component Value Date/Time   CHOL 152 06/25/2011 0400   TRIG 150 (H) 06/25/2011 0400   HDL 28 (L) 06/25/2011 0400   CHOLHDL 5.4 06/25/2011 0400   VLDL 30 06/25/2011 0400   LDLCALC 94 06/25/2011  0400    Other Studies Reviewed Today:  Cardiac catheterization 05/12/2017:  Prox RCA lesion is 100% stenosed.  Dist LM to Ost LAD lesion is 100% stenosed.  LIMA graft was visualized by angiography and is normal in caliber.  Dist Graft to Insertion lesion is 20% stenosed.  Mid LAD lesion is 30% stenosed.  Prox Cx to Mid Cx lesion is 50% stenosed.  Ost 2nd Mrg to 2nd Mrg lesion is 50% stenosed.  Ost 3rd Mrg lesion is 100% stenosed.  Origin lesion is 100% stenosed.  Origin lesion is 100% stenosed.  SVG graft was not visualized.  SVG graft was not visualized.   1. 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 Presumed that the vein grafts supplied the RCA and OM branches and have been chronically occluded.  2. The ostial LAD is chronically occluded. The LIMA graft to the LAD is patent. The stented segment of the graft at the anastomosis to the LAD is patent with minimal restenosis. The mid LAD stent just beyond the anastomosis is patent with minimal restenosis.  3.  The Circumflex artery is patent with moderate diffuse disease in the proximal and mid vessel. There are several small caliber OM branches with diffuse severe disease. The third Om branch has moderate non-obstructive disease. The 4th OM branch is now occluded. This is a new finding. This branch fills from left to left collaterals.  4. The RCA is chronically occluded in the proximal segment. The vein graft to the PDA is occluded. The mid and distal RCA fills briskly from left to right collaterals.  5. Mild elevation of LV filling pressure  Recommendations: Continue medical management of CAD. He has diffuse disease with no focal targets for PCI.   Assessment and Plan:  1. Multivessel CAD status post CABG with graft disease and DES intervention to the LAD and LIMA to LAD. Recent cardiac catheterization in the setting of angina symptoms shows no definitive PCI targets and plan will be to continue with medical therapy which is fairly well optimized at this point.  2. Ischemic cardiomyopathy with LVEF approximately 45%. No obvious fluid overload.  3. Essential hypertension, blood pressure well controlled today.  4. Mixed hyperlipidemia, continue his on Crestor with follow-up per Dr. Nadara Mustard.  Current medicines were reviewed with the patient today.  Disposition: Follow-up in 6 months, sooner if needed.  Signed, Satira Sark, MD, [facility] 05/30/2017 1:25 PM    [facility] [place], [place], [place] [address] Phone: [phone]; Fax: [phone]

## 2017-05-30 NOTE — Patient Instructions (Signed)

## 2017-06-03 DIAGNOSIS — M79671 Pain in right foot: Secondary | ICD-10-CM | POA: Diagnosis not present

## 2017-06-03 DIAGNOSIS — M25579 Pain in unspecified ankle and joints of unspecified foot: Secondary | ICD-10-CM | POA: Diagnosis not present

## 2017-06-03 DIAGNOSIS — M79672 Pain in left foot: Secondary | ICD-10-CM | POA: Diagnosis not present

## 2017-06-05 ENCOUNTER — Ambulatory Visit (INDEPENDENT_AMBULATORY_CARE_PROVIDER_SITE_OTHER): Payer: Medicare Other | Admitting: Otolaryngology

## 2017-06-05 DIAGNOSIS — R490 Dysphonia: Secondary | ICD-10-CM | POA: Diagnosis not present

## 2017-06-23 DIAGNOSIS — I739 Peripheral vascular disease, unspecified: Secondary | ICD-10-CM | POA: Diagnosis not present

## 2017-06-23 DIAGNOSIS — I2510 Atherosclerotic heart disease of native coronary artery without angina pectoris: Secondary | ICD-10-CM | POA: Diagnosis not present

## 2017-06-23 DIAGNOSIS — I10 Essential (primary) hypertension: Secondary | ICD-10-CM | POA: Diagnosis not present

## 2017-06-23 DIAGNOSIS — K219 Gastro-esophageal reflux disease without esophagitis: Secondary | ICD-10-CM | POA: Diagnosis not present

## 2017-06-23 DIAGNOSIS — F419 Anxiety disorder, unspecified: Secondary | ICD-10-CM | POA: Diagnosis not present

## 2017-06-23 DIAGNOSIS — E569 Vitamin deficiency, unspecified: Secondary | ICD-10-CM | POA: Diagnosis not present

## 2017-06-25 DIAGNOSIS — K219 Gastro-esophageal reflux disease without esophagitis: Secondary | ICD-10-CM | POA: Diagnosis not present

## 2017-06-25 DIAGNOSIS — Z6827 Body mass index (BMI) 27.0-27.9, adult: Secondary | ICD-10-CM | POA: Diagnosis not present

## 2017-06-25 DIAGNOSIS — Z23 Encounter for immunization: Secondary | ICD-10-CM | POA: Diagnosis not present

## 2017-06-25 DIAGNOSIS — I739 Peripheral vascular disease, unspecified: Secondary | ICD-10-CM | POA: Diagnosis not present

## 2017-06-25 DIAGNOSIS — Z0001 Encounter for general adult medical examination with abnormal findings: Secondary | ICD-10-CM | POA: Diagnosis not present

## 2017-06-25 DIAGNOSIS — M1990 Unspecified osteoarthritis, unspecified site: Secondary | ICD-10-CM | POA: Diagnosis not present

## 2017-06-25 DIAGNOSIS — F419 Anxiety disorder, unspecified: Secondary | ICD-10-CM | POA: Diagnosis not present

## 2017-06-25 DIAGNOSIS — G629 Polyneuropathy, unspecified: Secondary | ICD-10-CM | POA: Diagnosis not present

## 2017-06-25 DIAGNOSIS — I10 Essential (primary) hypertension: Secondary | ICD-10-CM | POA: Diagnosis not present

## 2017-06-25 DIAGNOSIS — I209 Angina pectoris, unspecified: Secondary | ICD-10-CM | POA: Diagnosis not present

## 2017-06-25 DIAGNOSIS — D329 Benign neoplasm of meninges, unspecified: Secondary | ICD-10-CM | POA: Diagnosis not present

## 2017-06-28 ENCOUNTER — Other Ambulatory Visit: Payer: Self-pay | Admitting: Nurse Practitioner

## 2017-06-28 DIAGNOSIS — K219 Gastro-esophageal reflux disease without esophagitis: Secondary | ICD-10-CM

## 2017-06-30 DIAGNOSIS — M79672 Pain in left foot: Secondary | ICD-10-CM | POA: Diagnosis not present

## 2017-06-30 DIAGNOSIS — M25579 Pain in unspecified ankle and joints of unspecified foot: Secondary | ICD-10-CM | POA: Diagnosis not present

## 2017-06-30 DIAGNOSIS — M79671 Pain in right foot: Secondary | ICD-10-CM | POA: Diagnosis not present

## 2017-07-02 ENCOUNTER — Other Ambulatory Visit: Payer: Self-pay | Admitting: Cardiology

## 2017-07-09 ENCOUNTER — Ambulatory Visit: Payer: Medicare Other | Admitting: Cardiology

## 2017-07-25 ENCOUNTER — Other Ambulatory Visit: Payer: Self-pay | Admitting: Cardiology

## 2017-07-28 DIAGNOSIS — M79671 Pain in right foot: Secondary | ICD-10-CM | POA: Diagnosis not present

## 2017-07-28 DIAGNOSIS — M25579 Pain in unspecified ankle and joints of unspecified foot: Secondary | ICD-10-CM | POA: Diagnosis not present

## 2017-07-28 DIAGNOSIS — M79672 Pain in left foot: Secondary | ICD-10-CM | POA: Diagnosis not present

## 2017-08-01 ENCOUNTER — Other Ambulatory Visit: Payer: Self-pay | Admitting: Cardiology

## 2017-08-06 ENCOUNTER — Other Ambulatory Visit: Payer: Self-pay | Admitting: Cardiology

## 2017-08-25 DIAGNOSIS — M25579 Pain in unspecified ankle and joints of unspecified foot: Secondary | ICD-10-CM | POA: Diagnosis not present

## 2017-08-25 DIAGNOSIS — M79672 Pain in left foot: Secondary | ICD-10-CM | POA: Diagnosis not present

## 2017-08-25 DIAGNOSIS — M79671 Pain in right foot: Secondary | ICD-10-CM | POA: Diagnosis not present

## 2017-09-09 ENCOUNTER — Other Ambulatory Visit: Payer: Self-pay | Admitting: Cardiology

## 2017-09-09 NOTE — Telephone Encounter (Signed)
Please review refill request 

## 2017-09-17 ENCOUNTER — Other Ambulatory Visit: Payer: Self-pay | Admitting: Cardiology

## 2017-09-17 DIAGNOSIS — Z6828 Body mass index (BMI) 28.0-28.9, adult: Secondary | ICD-10-CM | POA: Diagnosis not present

## 2017-09-17 DIAGNOSIS — M47816 Spondylosis without myelopathy or radiculopathy, lumbar region: Secondary | ICD-10-CM | POA: Diagnosis not present

## 2017-09-17 DIAGNOSIS — M47812 Spondylosis without myelopathy or radiculopathy, cervical region: Secondary | ICD-10-CM | POA: Diagnosis not present

## 2017-09-19 DIAGNOSIS — M5030 Other cervical disc degeneration, unspecified cervical region: Secondary | ICD-10-CM | POA: Diagnosis not present

## 2017-09-19 DIAGNOSIS — M545 Low back pain: Secondary | ICD-10-CM | POA: Diagnosis not present

## 2017-09-19 DIAGNOSIS — M542 Cervicalgia: Secondary | ICD-10-CM | POA: Diagnosis not present

## 2017-09-19 DIAGNOSIS — M5136 Other intervertebral disc degeneration, lumbar region: Secondary | ICD-10-CM | POA: Diagnosis not present

## 2017-09-29 DIAGNOSIS — M25579 Pain in unspecified ankle and joints of unspecified foot: Secondary | ICD-10-CM | POA: Diagnosis not present

## 2017-09-29 DIAGNOSIS — M79671 Pain in right foot: Secondary | ICD-10-CM | POA: Diagnosis not present

## 2017-09-29 DIAGNOSIS — M79672 Pain in left foot: Secondary | ICD-10-CM | POA: Diagnosis not present

## 2017-10-09 DIAGNOSIS — M5030 Other cervical disc degeneration, unspecified cervical region: Secondary | ICD-10-CM | POA: Diagnosis not present

## 2017-10-09 DIAGNOSIS — M5136 Other intervertebral disc degeneration, lumbar region: Secondary | ICD-10-CM | POA: Diagnosis not present

## 2017-10-15 ENCOUNTER — Other Ambulatory Visit: Payer: Self-pay | Admitting: *Deleted

## 2017-10-15 MED ORDER — LOSARTAN POTASSIUM 50 MG PO TABS: ORAL_TABLET | ORAL | 3 refills | Status: DC

## 2017-10-17 ENCOUNTER — Other Ambulatory Visit: Payer: Self-pay | Admitting: Nurse Practitioner

## 2017-10-17 DIAGNOSIS — K219 Gastro-esophageal reflux disease without esophagitis: Secondary | ICD-10-CM

## 2017-10-17 MED ORDER — PANTOPRAZOLE SODIUM 40 MG PO TBEC: DELAYED_RELEASE_TABLET | ORAL | 3 refills | Status: DC

## 2017-10-21 ENCOUNTER — Other Ambulatory Visit: Payer: Self-pay | Admitting: *Deleted

## 2017-10-21 MED ORDER — RANOLAZINE ER 500 MG PO TB12: ORAL_TABLET | ORAL | 3 refills | Status: DC

## 2017-10-23 DIAGNOSIS — M5416 Radiculopathy, lumbar region: Secondary | ICD-10-CM | POA: Diagnosis not present

## 2017-10-27 ENCOUNTER — Other Ambulatory Visit: Payer: Self-pay | Admitting: *Deleted

## 2017-10-27 DIAGNOSIS — E1140 Type 2 diabetes mellitus with diabetic neuropathy, unspecified: Secondary | ICD-10-CM | POA: Diagnosis not present

## 2017-10-27 DIAGNOSIS — E1151 Type 2 diabetes mellitus with diabetic peripheral angiopathy without gangrene: Secondary | ICD-10-CM | POA: Diagnosis not present

## 2017-10-27 MED ORDER — CLOPIDOGREL BISULFATE 75 MG PO TABS: 75.0000 mg | ORAL_TABLET | Freq: Every day | ORAL | 1 refills | Status: DC

## 2017-11-03 ENCOUNTER — Other Ambulatory Visit: Payer: Self-pay | Admitting: *Deleted

## 2017-11-05 ENCOUNTER — Ambulatory Visit (INDEPENDENT_AMBULATORY_CARE_PROVIDER_SITE_OTHER): Payer: Medicare Other | Admitting: Nurse Practitioner

## 2017-11-05 ENCOUNTER — Encounter: Payer: Self-pay | Admitting: Nurse Practitioner

## 2017-11-05 VITALS — BP 93/56 | HR 79 | Temp 97.0°F | Ht 65.0 in | Wt 169.0 lb

## 2017-11-05 DIAGNOSIS — I255 Ischemic cardiomyopathy: Secondary | ICD-10-CM

## 2017-11-05 DIAGNOSIS — K5900 Constipation, unspecified: Secondary | ICD-10-CM

## 2017-11-05 DIAGNOSIS — K219 Gastro-esophageal reflux disease without esophagitis: Secondary | ICD-10-CM

## 2017-11-05 NOTE — Progress Notes (Signed)
Referring Provider: Rory Percy, MD Primary Care Physician:  Rory Percy, MD Primary GI:  Dr. Gala Romney  Chief Complaint  Patient presents with  . Gastroesophageal Reflux    f/u. "feels it is a little worse"  . Constipation    feels worse. BM's are 1-2 times per week    HPI:   Hunter Townsend is a 77 y.o. male who presents for follow-up on GERD and constipation.  Patient was last seen in our office 05/02/2017 for the same.  Historically symptoms worse if he eats late at night, which is typical of most GERD patients.  Noted history of chronic constipation due to medications.  Previous colonoscopy a while ago but noted no polyps and no further screening necessary due to age.  At his last visit he was doing better overall with GERD much improved since stopping eating late at night.  On PPI twice daily, rare breakthrough.  Chronic constipation with stool softeners and suppositories and still with significant hoarseness without improvement.  No other GI symptoms.  Recommended is a 72 mcg, call with progress report in 1 to 2 weeks, continue other medications, refer to ENT for hoarseness, follow-up in 6 months.  The patient called our office 05/13/2017 saying Linzess worked well for him and he requested a prescription which was sent to his pharmacy.  Today he states he couldn't afford Linzess. Has lower abdominal pain in addition to his constipation and he isn't sure if it's related to that or his lower back disc problems. Has bowel movement twice a week, only if he takes a suppository. Hard, dry stools. Uses Preparation H regularly to help. Denies hematochezia. GERD symptoms are doing well. Still on PPI. No breakthrough symptoms. Denies N/V, melena, fever, chills, unintentional weight loss. Has DOE with back pain and s/p cardiac stenting; stills following with cardiology. Denies dizziness, lightheadedness, syncope, near syncope. Denies any other upper or lower GI symptoms.  Past Medical History:    Diagnosis Date  . Arthritis   . BPH (benign prostatic hyperplasia)   . CHF (congestive heart failure) ([place])   . Coronary atherosclerosis of native coronary artery    Multivessel, NSTEMI  s/p PTCA/DES to mid LAD 06/24/11,, known patent LIMA-LAD and occlusion of all vein grafts  . Hypertension   . Ischemic cardiomyopathy    EF 35-40% by cath 06/24/11 (EF 37% by nuclear stress test 06/2010).  . Myocardial infarction ([place]) 2013  . PAD (peripheral artery disease) ([facility])    Status post left SFA stent - Dr. Burt Knack    Past Surgical History:  Procedure Laterality Date  . BACK SURGERY     "cut me ~ 1/2inch to take sciatic nerve out of my right leg"  . CARDIAC CATHETERIZATION  03/28/2016  . CARDIAC CATHETERIZATION N/A 03/28/2016   Procedure: Left Heart Cath and Cors/Grafts Angiography;  Surgeon: Leonie Man, MD;  Location: [place] [facility];  Service: Cardiovascular;  Laterality: N/A;  . CATARACT EXTRACTION W/ INTRAOCULAR LENS  IMPLANT, BILATERAL    . CORONARY ANGIOPLASTY    . CORONARY ARTERY BYPASS GRAFT  1987   [place], [place] - LIMA to LAD, SVG to RCA presumably  . LEFT HEART CATH AND CORS/GRAFTS ANGIOGRAPHY N/A 05/12/2017   Procedure: LEFT HEART CATH AND CORS/GRAFTS ANGIOGRAPHY;  Surgeon: Burnell Blanks, MD;  Location: [place] [facility];  Service: Cardiovascular;  Laterality: N/A;  . LEFT HEART CATHETERIZATION WITH CORONARY/GRAFT ANGIOGRAM  06/24/2011   Procedure: LEFT HEART CATHETERIZATION WITH CORONARY/GRAFT ANGIOGRAM;  Surgeon: Burnell Blanks, MD;  Location: [facility] CATH LAB;  Service: Cardiovascular;;  . LEFT HEART CATHETERIZATION WITH CORONARY/GRAFT ANGIOGRAM N/A 02/26/2013   Procedure: LEFT HEART CATHETERIZATION WITH Beatrix Fetters;  Surgeon: Blane Ohara, MD;  [facility] CATH LAB;  Service: Cardiovascular;  Laterality: N/A;  . LOWER EXTREMITY ANGIOGRAM N/A 10/30/2011   Procedure: LOWER EXTREMITY ANGIOGRAM;  Surgeon: Sherren Mocha, MD;  Location: [facility] CATH LAB;   Service: Cardiovascular;  Laterality: N/A;  . PERCUTANEOUS CORONARY STENT INTERVENTION (PCI-S)  02/26/2013   Procedure: PERCUTANEOUS CORONARY STENT INTERVENTION (PCI-S);  Surgeon: Blane Ohara, MD;  Location: [facility] CATH LAB;  Service: Cardiovascular;;    Current Outpatient Medications  Medication Sig Dispense Refill  . acetaminophen (TYLENOL) 325 MG tablet Take 650 mg by mouth every 6 (six) hours as needed for moderate pain or headache.    . carvedilol (COREG) 12.5 MG tablet TAKE 1 & 1/2 TABLETS TWICE DAILY WITH MEAL. 90 tablet 0  . clopidogrel (PLAVIX) 75 MG tablet Take 1 tablet (75 mg total) by mouth daily. 90 tablet 1  . furosemide (LASIX) 40 MG tablet TAKE 1 TABLET ONCE DAILY. 30 tablet 3  . gabapentin (NEURONTIN) 100 MG capsule Take 100 mg by mouth 3 (three) times daily.     Marland Kitchen LORazepam (ATIVAN) 1 MG tablet Take 1 mg by mouth 2 (two) times daily as needed for anxiety (sleep).     . losartan (COZAAR) 50 MG tablet Take 50 mg by mouth once daily 90 tablet 3  . Multiple Vitamin (MULTIVITAMIN) tablet Take 1 tablet by mouth daily.    . nitroGLYCERIN (NITROSTAT) 0.4 MG SL tablet Place 1 tablet (0.4 mg total) under the tongue every 5 (five) minutes as needed. For chest pain 25 tablet 3  . Omega-3 Fatty Acids (FISH OIL) 1000 MG CAPS Take 2,000 mg by mouth daily.    Marland Kitchen OVER THE COUNTER MEDICATION Suppository to help with BM with Occasional fleet enema    . pantoprazole (PROTONIX) 40 MG tablet TAKE 1 TABLET 2 TIMES A DAY BEFORE A MEAL. 180 tablet 3  . potassium chloride (K-DUR) 10 MEQ tablet TAKE 1 TABLET ONCE DAILY. 30 tablet 6  . ranolazine (RANEXA) 500 MG 12 hr tablet TAKE (1) TABLET TWICE DAILY. 180 tablet 3  . rosuvastatin (CRESTOR) 5 MG tablet Take 1 tablet (5 mg total) by mouth daily. 90 tablet 3  . aspirin EC 81 MG tablet Take 81 mg by mouth daily.     No current facility-administered medications for this visit.     Allergies as of 11/05/2017  . (No Known Allergies)    Family History    Problem Relation Age of Onset  . CAD Unknown        pt. says he is unsure of the relationship  . Hypertension Unknown   . Colon cancer Neg Hx   . Gastric cancer Neg Hx   . Esophageal cancer Neg Hx     Social History   Socioeconomic History  . Marital status: Widowed    Spouse name: Not on file  . Number of children: Not on file  . Years of education: Not on file  . Highest education level: Not on file  Occupational History  . Occupation: Retired    Comment: Paediatric nurse  . Financial resource strain: Not on file  . Food insecurity:    Worry: Not on file    Inability: Not on file  . Transportation needs:    Medical:  Not on file    Non-medical: Not on file  Tobacco Use  . Smoking status: Former Smoker    Packs/day: 2.00    Years: 40.00    Pack years: 80.00    Types: Cigarettes    Last attempt to quit: 05/27/1993    Years since quitting: 24.4  . Smokeless tobacco: Never Used  Substance and Sexual Activity  . Alcohol use: No    Alcohol/week: 0.0 oz  . Drug use: No  . Sexual activity: Not Currently  Lifestyle  . Physical activity:    Days per week: Not on file    Minutes per session: Not on file  . Stress: Not on file  Relationships  . Social connections:    Talks on phone: Not on file    Gets together: Not on file    Attends religious service: Not on file    Active member of club or organization: Not on file    Attends meetings of clubs or organizations: Not on file    Relationship status: Not on file  Other Topics Concern  . Not on file  Social History Narrative  . Not on file    Review of Systems: General: Negative for anorexia, weight loss, fever, chills, fatigue, weakness. ENT: Negative for hoarseness, difficulty swallowing. CV: Negative for chest pain, angina, palpitations, peripheral edema.  Respiratory: Negative for dyspnea at rest, cough, sputum, wheezing.  GI: See history of present illness. MS: Admits chronic back pain.  Derm:  Negative for rash or itching.  Endo: Negative for unusual weight change.  Heme: Negative for bruising or bleeding.   Physical Exam: BP (!) 93/56   Pulse 79   Temp (!) 97 F (36.1 C) (Oral)   Ht 5\' 5"  (1.651 m)   Wt 169 lb (76.7 kg)   BMI 28.12 kg/m  General:   Alert and oriented. Pleasant and cooperative. Well-nourished and well-developed.  Eyes:  Without icterus, sclera clear and conjunctiva pink.  Ears:  Normal auditory acuity. Cardiovascular:  S1, S2 present without murmurs appreciated. Extremities without clubbing or edema. Respiratory:  Clear to auscultation bilaterally. No wheezes, rales, or rhonchi. No distress.  Gastrointestinal:  +BS, soft, non-tender and non-distended. No HSM noted. No guarding or rebound. No masses appreciated.  Rectal:  Deferred  Musculoskalatal:  Symmetrical without gross deformities. Skin:  Intact without significant lesions or rashes. Neurologic:  Alert and oriented x4;  grossly normal neurologically. Psych:  Alert and cooperative. Normal mood and affect. Heme/Lymph/Immune: No excessive bruising noted.    11/05/2017 9:51 AM   Disclaimer: This note was dictated with voice recognition software. Similar sounding words can inadvertently be transcribed and may not be corrected upon review.

## 2017-11-05 NOTE — Assessment & Plan Note (Signed)
GERD symptoms currently doing well.  Recommend continue PPI.  Return for follow-up in 3 months.

## 2017-11-05 NOTE — Patient Instructions (Signed)
1. I am giving you samples of Amitiza 24 mcg.  Take this twice a day, on a full stomach 2. Call in 1 to 2 weeks and let us know how it is working for you. 3. If it works we can send in a prescription. 4. Anytime we send in a prescription, if it is too expensive simply call and let us know so we can look at other alternatives. 5. Follow-up in 3 months. 6. Call us if you have any questions or concerns.  At [facility] we value your feedback. You may receive a survey about your visit today. Please share your experience as we strive to create trusting relationships with our patients to provide genuine, compassionate, quality care.  It was great to see you today!  Hope you have a great summer!!

## 2017-11-05 NOTE — Assessment & Plan Note (Signed)
Notes worsening constipation.  Uses a suppository to have bowel movement twice a day.  Stools are hard and difficult to pass, dry.  No rectal bleeding at this time.  Linzess worked well for him but he could not afford it.  I will give him samples of Amitiza 24 mcg to see if this helps.  He will call us in 1 to 2 weeks and let us know.  If it works we can send in a prescription to his pharmacy and I have asked him to call and let us know if he is unable to afford it so we can make other recommendations.  Follow-up in 3 months.

## 2017-11-05 NOTE — Progress Notes (Signed)
CC'D TO PCP °

## 2017-11-07 DIAGNOSIS — H35373 Puckering of macula, bilateral: Secondary | ICD-10-CM | POA: Diagnosis not present

## 2017-11-07 DIAGNOSIS — Z961 Presence of intraocular lens: Secondary | ICD-10-CM | POA: Diagnosis not present

## 2017-11-07 DIAGNOSIS — H35342 Macular cyst, hole, or pseudohole, left eye: Secondary | ICD-10-CM | POA: Diagnosis not present

## 2017-11-07 DIAGNOSIS — H524 Presbyopia: Secondary | ICD-10-CM | POA: Diagnosis not present

## 2017-11-11 DIAGNOSIS — M5136 Other intervertebral disc degeneration, lumbar region: Secondary | ICD-10-CM | POA: Diagnosis not present

## 2017-11-18 ENCOUNTER — Other Ambulatory Visit: Payer: Self-pay | Admitting: Cardiology

## 2017-11-26 ENCOUNTER — Encounter: Payer: Self-pay | Admitting: *Deleted

## 2017-11-26 NOTE — Progress Notes (Deleted)
Cardiology Office Note  Date: 11/26/2017   ID: Hunter Townsend, DOB August 14, 1940, MRN [id number]  PCP: Rory Percy, MD  Primary Cardiologist: Rozann Lesches, MD   No chief complaint on file.   History of Present Illness: Hunter Townsend is a 77 y.o. male last seen in January.  Past Medical History:  Diagnosis Date  . Arthritis   . BPH (benign prostatic hyperplasia)   . CHF (congestive heart failure) ([place])   . Coronary atherosclerosis of native coronary artery    Multivessel, NSTEMI  s/p PTCA/DES to mid LAD 06/24/11,, known patent LIMA-LAD and occlusion of all vein grafts  . Hypertension   . Ischemic cardiomyopathy    EF 35-40% by cath 06/24/11 (EF 37% by nuclear stress test 06/2010).  . Myocardial infarction ([place]) 2013  . PAD (peripheral artery disease) ([facility])    Status post left SFA stent - Dr. Burt Knack    Past Surgical History:  Procedure Laterality Date  . BACK SURGERY     "cut me ~ 1/2inch to take sciatic nerve out of my right leg"  . CARDIAC CATHETERIZATION  03/28/2016  . CARDIAC CATHETERIZATION N/A 03/28/2016   Procedure: Left Heart Cath and Cors/Grafts Angiography;  Surgeon: Leonie Man, MD;  Location: [place] [facility];  Service: Cardiovascular;  Laterality: N/A;  . CATARACT EXTRACTION W/ INTRAOCULAR LENS  IMPLANT, BILATERAL    . CORONARY ANGIOPLASTY    . CORONARY ARTERY BYPASS GRAFT  1987   [place], [place] - LIMA to LAD, SVG to RCA presumably  . LEFT HEART CATH AND CORS/GRAFTS ANGIOGRAPHY N/A 05/12/2017   Procedure: LEFT HEART CATH AND CORS/GRAFTS ANGIOGRAPHY;  Surgeon: Burnell Blanks, MD;  Location: [place] [facility];  Service: Cardiovascular;  Laterality: N/A;  . LEFT HEART CATHETERIZATION WITH CORONARY/GRAFT ANGIOGRAM  06/24/2011   Procedure: LEFT HEART CATHETERIZATION WITH Beatrix Fetters;  Surgeon: Burnell Blanks, MD;  Location: [facility] CATH LAB;  Service: Cardiovascular;;  . LEFT HEART CATHETERIZATION WITH CORONARY/GRAFT ANGIOGRAM N/A  02/26/2013   Procedure: LEFT HEART CATHETERIZATION WITH Beatrix Fetters;  Surgeon: Blane Ohara, MD;  Location: [facility] CATH LAB;  Service: Cardiovascular;  Laterality: N/A;  . LOWER EXTREMITY ANGIOGRAM N/A 10/30/2011   Procedure: LOWER EXTREMITY ANGIOGRAM;  Surgeon: Sherren Mocha, MD;  Location: [facility] CATH LAB;  Service: Cardiovascular;  Laterality: N/A;  . PERCUTANEOUS CORONARY STENT INTERVENTION (PCI-S)  02/26/2013   Procedure: PERCUTANEOUS CORONARY STENT INTERVENTION (PCI-S);  Surgeon: Blane Ohara, MD;  Location: [facility] CATH LAB;  Service: Cardiovascular;;    Current Outpatient Medications  Medication Sig Dispense Refill  . acetaminophen (TYLENOL) 325 MG tablet Take 650 mg by mouth every 6 (six) hours as needed for moderate pain or headache.    . carvedilol (COREG) 12.5 MG tablet TAKE 1 & 1/2 TABLETS TWICE DAILY WITH MEAL. 90 tablet 6  . clopidogrel (PLAVIX) 75 MG tablet Take 1 tablet (75 mg total) by mouth daily. 90 tablet 1  . furosemide (LASIX) 40 MG tablet TAKE 1 TABLET ONCE DAILY. 30 tablet 3  . gabapentin (NEURONTIN) 100 MG capsule Take 100 mg by mouth 3 (three) times daily.     Marland Kitchen LORazepam (ATIVAN) 1 MG tablet Take 1 mg by mouth 2 (two) times daily as needed for anxiety (sleep).     . losartan (COZAAR) 50 MG tablet Take 50 mg by mouth once daily 90 tablet 3  . Multiple Vitamin (MULTIVITAMIN) tablet Take 1 tablet by mouth daily.    . nitroGLYCERIN (NITROSTAT) 0.4 MG  SL tablet Place 1 tablet (0.4 mg total) under the tongue every 5 (five) minutes as needed. For chest pain 25 tablet 3  . Omega-3 Fatty Acids (FISH OIL) 1000 MG CAPS Take 2,000 mg by mouth daily.    Marland Kitchen OVER THE COUNTER MEDICATION Suppository to help with BM with Occasional fleet enema    . pantoprazole (PROTONIX) 40 MG tablet TAKE 1 TABLET 2 TIMES A DAY BEFORE A MEAL. 180 tablet 3  . potassium chloride (K-DUR) 10 MEQ tablet TAKE 1 TABLET ONCE DAILY. 30 tablet 6  . ranolazine (RANEXA) 500 MG 12 hr tablet TAKE (1) TABLET  TWICE DAILY. 180 tablet 3  . rosuvastatin (CRESTOR) 5 MG tablet Take 1 tablet (5 mg total) by mouth daily. 90 tablet 3   No current facility-administered medications for this visit.    Allergies:  Patient has no known allergies.   Social History: The patient  reports that he quit smoking about 24 years ago. His smoking use included cigarettes. He has a 80.00 pack-year smoking history. He has never used smokeless tobacco. He reports that he does not drink alcohol or use drugs.   Family History: The patient's family history includes CAD in his unknown relative; Hypertension in his unknown relative.   ROS:  Please see the history of present illness. Otherwise, complete review of systems is positive for {NONE DEFAULTED:18576::"none"}.  All other systems are reviewed and negative.   Physical Exam: VS:  There were no vitals taken for this visit., BMI There is no height or weight on file to calculate BMI.  Wt Readings from Last 3 Encounters:  11/05/17 169 lb (76.7 kg)  05/30/17 169 lb 9.6 oz (76.9 kg)  05/12/17 160 lb (72.6 kg)    General: Patient appears comfortable at rest. HEENT: Conjunctiva and lids normal, oropharynx clear with moist mucosa. Neck: Supple, no elevated JVP or carotid bruits, no thyromegaly. Lungs: Clear to auscultation, nonlabored breathing at rest. Cardiac: Regular rate and rhythm, no S3 or significant systolic murmur, no pericardial rub. Abdomen: Soft, nontender, no hepatomegaly, bowel sounds present, no guarding or rebound. Extremities: No pitting edema, distal pulses 2+. Skin: Warm and dry. Musculoskeletal: No kyphosis. Neuropsychiatric: Alert and oriented x3, affect grossly appropriate.  ECG: I personally reviewed the tracing from 05/09/2017 which showed sinus rhythm with IVCD, repolarization abnormalities, and fusion beats.  Recent Labwork: 05/12/2017: BUN 13; Creatinine, Ser 1.27; Hemoglobin 12.3; Platelets 285; Potassium 4.6; Sodium 139  #2017: Cholesterol  146, triglycerides 113, HDL 30, LDL 93  Other Studies Reviewed Today:  Cardiac catheterization 05/12/2017:  Prox RCA lesion is 100% stenosed.  Dist LM to Ost LAD lesion is 100% stenosed.  LIMA graft was visualized by angiography and is normal in caliber.  Dist Graft to Insertion lesion is 20% stenosed.  Mid LAD lesion is 30% stenosed.  Prox Cx to Mid Cx lesion is 50% stenosed.  Ost 2nd Mrg to 2nd Mrg lesion is 50% stenosed.  Ost 3rd Mrg lesion is 100% stenosed.  Origin lesion is 100% stenosed.  Origin lesion is 100% stenosed.  SVG graft was not visualized.  SVG graft was not visualized.  1. 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 Presumed that the vein grafts supplied the RCA and OM branches and have been chronically occluded.  2. The ostial LAD is chronically occluded. The LIMA graft to the LAD is patent. The stented segment of the graft at the anastomosis to the LAD is  patent with minimal restenosis. The mid LAD stent just beyond the anastomosis is patent with minimal restenosis.  3. The Circumflex artery is patent with moderate diffuse disease in the proximal and mid vessel. There are several small caliber OM branches with diffuse severe disease. The third Om branch has moderate non-obstructive disease. The 4th OM branch is now occluded. This is a new finding. This branch fills from left to left collaterals.  4. The RCA is chronically occluded in the proximal segment. The vein graft to the PDA is occluded. The mid and distal RCA fills briskly from left to right collaterals.  5. Mild elevation of LV filling pressure  Recommendations: Continue medical management of CAD. He has diffuse disease with no focal targets for PCI.   Assessment and Plan:    Current medicines were reviewed with the patient today.  No orders of the defined types were placed in this encounter.   Disposition:  Signed, Satira Sark,  MD, [facility] 11/26/2017 10:27 AM    [place] [facility] [place], [place], [place] [address] Phone: [phone]; Fax: [phone]

## 2017-11-28 ENCOUNTER — Ambulatory Visit: Payer: Medicare Other | Admitting: Cardiology

## 2017-11-28 ENCOUNTER — Encounter: Payer: Self-pay | Admitting: *Deleted

## 2017-12-01 DIAGNOSIS — M79672 Pain in left foot: Secondary | ICD-10-CM | POA: Diagnosis not present

## 2017-12-01 DIAGNOSIS — M25579 Pain in unspecified ankle and joints of unspecified foot: Secondary | ICD-10-CM | POA: Diagnosis not present

## 2017-12-01 DIAGNOSIS — M79671 Pain in right foot: Secondary | ICD-10-CM | POA: Diagnosis not present

## 2017-12-02 ENCOUNTER — Encounter: Payer: Self-pay | Admitting: Cardiology

## 2017-12-05 DIAGNOSIS — M47816 Spondylosis without myelopathy or radiculopathy, lumbar region: Secondary | ICD-10-CM | POA: Diagnosis not present

## 2017-12-12 DIAGNOSIS — F419 Anxiety disorder, unspecified: Secondary | ICD-10-CM | POA: Diagnosis not present

## 2017-12-12 DIAGNOSIS — M4800 Spinal stenosis, site unspecified: Secondary | ICD-10-CM | POA: Diagnosis not present

## 2017-12-12 DIAGNOSIS — M1990 Unspecified osteoarthritis, unspecified site: Secondary | ICD-10-CM | POA: Diagnosis not present

## 2017-12-12 DIAGNOSIS — Z6828 Body mass index (BMI) 28.0-28.9, adult: Secondary | ICD-10-CM | POA: Diagnosis not present

## 2017-12-29 DIAGNOSIS — M79672 Pain in left foot: Secondary | ICD-10-CM | POA: Diagnosis not present

## 2017-12-29 DIAGNOSIS — M25579 Pain in unspecified ankle and joints of unspecified foot: Secondary | ICD-10-CM | POA: Diagnosis not present

## 2017-12-29 DIAGNOSIS — M79671 Pain in right foot: Secondary | ICD-10-CM | POA: Diagnosis not present

## 2018-01-07 ENCOUNTER — Other Ambulatory Visit: Payer: Self-pay | Admitting: Cardiology

## 2018-01-14 NOTE — Progress Notes (Signed)
Cardiology Office Note  Date: 01/16/2018   ID: Reece, Fehnel [birth year], MRN [id number]  PCP: Rory Percy, MD  Primary Cardiologist: Rozann Lesches, MD   Chief Complaint  Patient presents with  . Coronary Artery Disease    History of Present Illness: Hunter Townsend is a 77 y.o. male last seen in January.  He is here for a routine follow-up visit.  Other than reduced stamina which is been an issue for him for the last few years, he does not report any increasing angina symptoms.  He states that he has been compliant with his medications.  Current cardiac regimen as outlined below.  He had come off aspirin in the last few months at the direction of Dr. Nadara Mustard.  I asked him to resume aspirin 81 mg daily along with Plavix.  He has multiple indications to continue antiplatelet therapy, and with his complex CAD status post CABG and PCI, I would recommend continuing dual antiplatelet therapy in the absence of any bleeding problems.  I personally reviewed his ECG today which shows probable sinus rhythm with significant lead artifact, fusion beats, and PVCs.  There are nonspecific ST-T changes.  He states that he is due for follow-up lab work with Dr. Nadara Mustard in the next few months.  Past Medical History:  Diagnosis Date  . Arthritis   . BPH (benign prostatic hyperplasia)   . CHF (congestive heart failure) ([place])   . Coronary atherosclerosis of native coronary artery    Multivessel, NSTEMI  s/p PTCA/DES to mid LAD 06/24/11,, known patent LIMA-LAD and occlusion of all vein grafts  . Hypertension   . Ischemic cardiomyopathy    EF 35-40% by cath 06/24/11 (EF 37% by nuclear stress test 06/2010).  . Myocardial infarction ([place]) 2013  . PAD (peripheral artery disease) ([facility])    Status post left SFA stent - Dr. Burt Knack    Past Surgical History:  Procedure Laterality Date  . BACK SURGERY     "cut me ~ 1/2inch to take sciatic nerve out of my right leg"  . CARDIAC CATHETERIZATION   03/28/2016  . CARDIAC CATHETERIZATION N/A 03/28/2016   Procedure: Left Heart Cath and Cors/Grafts Angiography;  Surgeon: Leonie Man, MD;  Location: [facility];  Service: Cardiovascular;  Laterality: N/A;  . CATARACT EXTRACTION W/ INTRAOCULAR LENS  IMPLANT, BILATERAL    . CORONARY ANGIOPLASTY    . CORONARY ARTERY BYPASS GRAFT  1987   [place], [place] - LIMA to LAD, SVG to RCA presumably  . LEFT HEART CATH AND CORS/GRAFTS ANGIOGRAPHY N/A 05/12/2017   Procedure: LEFT HEART CATH AND CORS/GRAFTS ANGIOGRAPHY;  Surgeon: Burnell Blanks, MD;  Location: [facility];  Service: Cardiovascular;  Laterality: N/A;  . LEFT HEART CATHETERIZATION WITH CORONARY/GRAFT ANGIOGRAM  06/24/2011   Procedure: LEFT HEART CATHETERIZATION WITH Beatrix Fetters;  Surgeon: Burnell Blanks, MD;  Location: [facility] C[facility] LAB;  Service: Cardiovascular;;  . LEFT HEART CATHETERIZATION WITH CORONARY/GRAFT ANGIOGRAM N/A 02/26/2013   Procedure: LEFT HEART CATHETERIZATION WITH Beatrix Fetters;  Surgeon: Blane Ohara, MD;  Location: [facility] CATH LAB;  Service: Cardiovascular;  Laterality: N/A;  . LOWER EXTREMITY ANGIOGRAM N/A 10/30/2011   Procedure: LOWER EXTREMITY ANGIOGRAM;  Surgeon: Sherren Mocha, MD;  Location: [facility] CATH LAB;  Service: Cardiovascular;  Laterality: N/A;  . PERCUTANEOUS CORONARY STENT INTERVENTION (PCI-S)  02/26/2013   Procedure: PERCUTANEOUS CORONARY STENT INTERVENTION (PCI-S);  Surgeon: Blane Ohara, MD;  Location: [facility] CATH LAB;  Service: Cardiovascular;;  Current Outpatient Medications  Medication Sig Dispense Refill  . acetaminophen (TYLENOL) 325 MG tablet Take 650 mg by mouth every 6 (six) hours as needed for moderate pain or headache.    . carvedilol (COREG) 12.5 MG tablet TAKE 1 & 1/2 TABLETS TWICE DAILY WITH MEAL. 90 tablet 6  . clopidogrel (PLAVIX) 75 MG tablet Take 1 tablet (75 mg total) by mouth daily. 90 tablet 1  . furosemide (LASIX) 40 MG tablet TAKE 1 TABLET  ONCE DAILY. 30 tablet 6  . gabapentin (NEURONTIN) 100 MG capsule Take 100 mg by mouth 3 (three) times daily.     Marland Kitchen LORazepam (ATIVAN) 1 MG tablet Take 1 mg by mouth 2 (two) times daily as needed for anxiety (sleep).     . losartan (COZAAR) 50 MG tablet Take 50 mg by mouth once daily 90 tablet 3  . Multiple Vitamin (MULTIVITAMIN) tablet Take 1 tablet by mouth daily.    . nitroGLYCERIN (NITROSTAT) 0.4 MG SL tablet Place 1 tablet (0.4 mg total) under the tongue every 5 (five) minutes as needed. For chest pain 25 tablet 3  . Omega-3 Fatty Acids (FISH OIL) 1000 MG CAPS Take 2,000 mg by mouth daily.    Marland Kitchen OVER THE COUNTER MEDICATION Suppository to help with BM with Occasional fleet enema    . pantoprazole (PROTONIX) 40 MG tablet TAKE 1 TABLET 2 TIMES A DAY BEFORE A MEAL. 180 tablet 3  . ranitidine (ZANTAC) 150 MG tablet Take 150 mg by mouth 2 (two) times daily.    . ranolazine (RANEXA) 500 MG 12 hr tablet TAKE (1) TABLET TWICE DAILY. 180 tablet 3  . rosuvastatin (CRESTOR) 5 MG tablet Take 1 tablet (5 mg total) by mouth daily. 90 tablet 3  . aspirin EC 81 MG tablet Take 1 tablet (81 mg total) by mouth daily. 90 tablet 3   No current facility-administered medications for this visit.    Allergies:  Patient has no known allergies.   Social History: The patient  reports that he quit smoking about 24 years ago. His smoking use included cigarettes. He has a 80.00 pack-year smoking history. He has never used smokeless tobacco. He reports that he does not drink alcohol or use drugs.   ROS:  Please see the history of present illness. Otherwise, complete review of systems is positive for arthritic stiffness.  All other systems are reviewed and negative.   Physical Exam: VS:  BP (!) 100/54   Ht 5\' 5"  (1.651 m)   Wt 168 lb (76.2 kg)   SpO2 98%   BMI 27.96 kg/m , BMI Body mass index is 27.96 kg/m.  Wt Readings from Last 3 Encounters:  01/16/18 168 lb (76.2 kg)  11/05/17 169 lb (76.7 kg)  05/30/17 169 lb  9.6 oz (76.9 kg)    General: Patient appears comfortable at rest. HEENT: Conjunctiva and lids normal, oropharynx clear. Neck: Supple, no elevated JVP or carotid bruits, no thyromegaly. Lungs: Clear to auscultation, nonlabored breathing at rest. Cardiac: Regular rate and rhythm, no S3, soft systolic murmur. Abdomen: Soft, nontender, bowel sounds present. Extremities: No pitting edema, distal pulses 2+. Skin: Warm and dry. Musculoskeletal: No kyphosis. Neuropsychiatric: Alert and oriented x3, affect grossly appropriate.  ECG: I personally reviewed the tracing from 05/09/2017 which showed sinus rhythm with frequent fusion beats and nonspecific ST-T changes.  Recent Labwork: 05/12/2017: BUN 13; Creatinine, Ser 1.27; Hemoglobin 12.3; Platelets 285; Potassium 4.6; Sodium 139   Other Studies Reviewed Today:  Cardiac catheterization 05/12/2017:  Prox RCA lesion is 100% stenosed.  Dist LM to Ost LAD lesion is 100% stenosed.  LIMA graft was visualized by angiography and is normal in caliber.  Dist Graft to Insertion lesion is 20% stenosed.  Mid LAD lesion is 30% stenosed.  Prox Cx to Mid Cx lesion is 50% stenosed.  Ost 2nd Mrg to 2nd Mrg lesion is 50% stenosed.  Ost 3rd Mrg lesion is 100% stenosed.  Origin lesion is 100% stenosed.  Origin lesion is 100% stenosed.  SVG graft was not visualized.  SVG graft was not visualized.  1. 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 Presumed that the vein grafts supplied the RCA and OM branches and have been chronically occluded.  2. The ostial LAD is chronically occluded. The LIMA graft to the LAD is patent. The stented segment of the graft at the anastomosis to the LAD is patent with minimal restenosis. The mid LAD stent just beyond the anastomosis is patent with minimal restenosis.  3. The Circumflex artery is patent with moderate diffuse disease in the proximal and mid vessel. There  are several small caliber OM branches with diffuse severe disease. The third Om branch has moderate non-obstructive disease. The 4th OM branch is now occluded. This is a new finding. This branch fills from left to left collaterals.  4. The RCA is chronically occluded in the proximal segment. The vein graft to the PDA is occluded. The mid and distal RCA fills briskly from left to right collaterals.  5. Mild elevation of LV filling pressure  Recommendations: Continue medical management of CAD. He has diffuse disease with no focal targets for PCI.  Assessment and Plan:  1.  Multivessel CAD status post CABG with graft disease and subsequent DES intervention.  He reports no progressive angina symptoms and plan is to continue medical therapy.  I recommended that he resume dual antiplatelet therapy as discussed above.  2.  Ischemic cardiomyopathy with LVEF 45%.  Continue Coreg and Cozaar.  3.  Mixed hyperlipidemia, on Crestor.  He will be following up lab work with Dr. Nadara Mustard.  4.  Essential hypertension, blood pressure is well controlled today.  No changes were made in medical therapy.  Current medicines were reviewed with the patient today.   Orders Placed This Encounter  Procedures  . EKG 12-Lead    Disposition: Follow-up in 6 months.  Signed, Satira Sark, MD, [facility] 01/16/2018 9:33 AM    [facility] [place], [place], [place] [address] Phone: [phone]; Fax: [phone]

## 2018-01-15 ENCOUNTER — Encounter: Payer: Self-pay | Admitting: *Deleted

## 2018-01-16 ENCOUNTER — Ambulatory Visit (INDEPENDENT_AMBULATORY_CARE_PROVIDER_SITE_OTHER): Payer: Medicare Other | Admitting: Cardiology

## 2018-01-16 ENCOUNTER — Encounter: Payer: Self-pay | Admitting: Cardiology

## 2018-01-16 VITALS — BP 100/54 | Ht 65.0 in | Wt 168.0 lb

## 2018-01-16 DIAGNOSIS — I255 Ischemic cardiomyopathy: Secondary | ICD-10-CM

## 2018-01-16 DIAGNOSIS — E782 Mixed hyperlipidemia: Secondary | ICD-10-CM | POA: Diagnosis not present

## 2018-01-16 DIAGNOSIS — I25119 Atherosclerotic heart disease of native coronary artery with unspecified angina pectoris: Secondary | ICD-10-CM | POA: Diagnosis not present

## 2018-01-16 DIAGNOSIS — I10 Essential (primary) hypertension: Secondary | ICD-10-CM

## 2018-01-16 MED ORDER — ASPIRIN EC 81 MG PO TBEC: 81.0000 mg | DELAYED_RELEASE_TABLET | Freq: Every day | ORAL | 3 refills | Status: DC

## 2018-01-16 NOTE — Patient Instructions (Addendum)
Medication Instructions:   Your physician has recommended you make the following change in your medication:   Restart aspirin 81 mg by mouth daily.  Continue all other medications the same.  Labwork:  NONE  Testing/Procedures:  NONE  Follow-Up:  Your physician recommends that you schedule a follow-up appointment in: 6 months. You will receive a reminder letter in the mail in about 4 months reminding you to call and schedule your appointment. If you don't receive this letter, please contact our office.  Any Other Special Instructions Will Be Listed Below (If Applicable).  If you need a refill on your cardiac medications before your next appointment, please call your pharmacy.

## 2018-02-02 DIAGNOSIS — M25579 Pain in unspecified ankle and joints of unspecified foot: Secondary | ICD-10-CM | POA: Diagnosis not present

## 2018-02-02 DIAGNOSIS — M79671 Pain in right foot: Secondary | ICD-10-CM | POA: Diagnosis not present

## 2018-02-02 DIAGNOSIS — M79672 Pain in left foot: Secondary | ICD-10-CM | POA: Diagnosis not present

## 2018-02-09 ENCOUNTER — Encounter: Payer: Self-pay | Admitting: Nurse Practitioner

## 2018-02-09 ENCOUNTER — Ambulatory Visit (INDEPENDENT_AMBULATORY_CARE_PROVIDER_SITE_OTHER): Payer: Medicare Other | Admitting: Nurse Practitioner

## 2018-02-09 VITALS — BP 100/61 | HR 77 | Temp 98.2°F | Ht 65.0 in | Wt 168.6 lb

## 2018-02-09 DIAGNOSIS — K219 Gastro-esophageal reflux disease without esophagitis: Secondary | ICD-10-CM | POA: Diagnosis not present

## 2018-02-09 DIAGNOSIS — K5900 Constipation, unspecified: Secondary | ICD-10-CM | POA: Diagnosis not present

## 2018-02-09 DIAGNOSIS — I255 Ischemic cardiomyopathy: Secondary | ICD-10-CM | POA: Diagnosis not present

## 2018-02-09 NOTE — Assessment & Plan Note (Signed)
GERD symptoms doing well on PPI.  Recommend he continue his current regimen and follow-up in 6 months.

## 2018-02-09 NOTE — Patient Instructions (Signed)
1. Continue your current bowel regimen (suppository as needed for constipation) 2. Follow-up with [place] in 6 months. 3. Call if you have any questions or concerns.  At [facility] we value your feedback. You may receive a survey about your visit today. Please share your experience as we strive to create trusting relationships with our patients to provide genuine, compassionate, quality care.  We appreciate your understanding and patience as we review any laboratory studies, imaging, and other diagnostic tests that are ordered as we care for you. Our office policy is 5 business days for review of these results, and any emergent or urgent results are addressed in a timely manner for your best interest. If you do not hear from our office in 1 week, please contact us.   We also encourage the use of MyChart, which contains your medical information for your review as well. If you are not enrolled in this feature, an access code is on this after visit summary for your convenience. Thank you for allowing [place] to be involved in your care.  It was great to see you today!  I hope you have a great Fall!!

## 2018-02-09 NOTE — Assessment & Plan Note (Signed)
He could not afford Linzess.  Amitiza 24 mcg caused diarrhea.  He is currently taking a suppository when needed for constipation which is only about every 3 weeks.  Discussed options including a lower dose of Amitiza versus continuing his current regimen.  After a discussion we decided he would continue his current regimen as it seems to be effective.  Daily constipation medication may be a bit overkill.  No red flag or warning signs or symptoms.  We will have him follow-up in 6 months and if he continues to do well then we can have him follow-up as needed.

## 2018-02-09 NOTE — Progress Notes (Signed)
Referring Provider: Rory Percy, MD Primary Care Physician:  Rory Percy, MD Primary GI:  Dr. Gala Romney  Chief Complaint  Patient presents with  . Constipation    depends on what he eats. Uses supp when he has an issue  . Gastroesophageal Reflux    f/u. Doing well    HPI:   Hunter Townsend is a 77 y.o. male who presents for follow-up on GERD and constipation.  The patient was last seen in our office 11/05/2017 for the same.  Chronic history of both, previously noted were symptoms if he eats late at night.  Colonoscopy up-to-date and no recommended repeat for screening purposes due to age.  Previously responded to twice daily PPI and Linzess 72 mcg.  At his last visit he states he did not obtain the prescription because he could not afford it.  Has lower abdominal pain in addition to constipation but unsure if pain is related to his known lower back disc problems.  Bowel movement twice a week but only with suppository with noted hard, dry stools.  Uses Preparation H for hemorrhoid symptoms.  GERD symptoms doing well still on PPI with no breakthrough.  No other GI symptoms.  Recommended trial of Amitiza 24 mcg twice daily with a progress report.  Recommended he call and inform us if the prescription is too expensive so we have a chance to change it to other alternatives.  Follow-up in 3 months.  Progress report was called to our office.  Today he states he's doing well overall. GERD is doing well, hasn't had any breakthrough. Continues on PPI. Constipation is about the same. Tried Amitiza 24 mcg bid and had diarrhea for 4 days. Stopped taking them. His constipation is intermittent. Doesn't eat very heavy. His constipation is intermittent and when it occurs he uses a suppository and his constipation resolves; he is only needing to do this about once every 3 weeks. Does have lower abdominal discomfort which does resolve with a bowel movement. Denies hematochezia, melena. Denies chest pain,  dyspnea, dizziness, lightheadedness, syncope, near syncope. Denies any other upper or lower GI symptoms.  Past Medical History:  Diagnosis Date  . Arthritis   . BPH (benign prostatic hyperplasia)   . CHF (congestive heart failure) ([place])   . Coronary atherosclerosis of native coronary artery    Multivessel, NSTEMI  s/p PTCA/DES to mid LAD 06/24/11,, known patent LIMA-LAD and occlusion of all vein grafts  . Hypertension   . Ischemic cardiomyopathy    EF 35-40% by cath 06/24/11 (EF 37% by nuclear stress test 06/2010).  . Myocardial infarction ([place]) 2013  . PAD (peripheral artery disease) ([facility])    Status post left SFA stent - Dr. Burt Knack    Past Surgical History:  Procedure Laterality Date  . BACK SURGERY     "cut me ~ 1/2inch to take sciatic nerve out of my right leg"  . CARDIAC CATHETERIZATION  03/28/2016  . CARDIAC CATHETERIZATION N/A 03/28/2016   Procedure: Left Heart Cath and Cors/Grafts Angiography;  Surgeon: Leonie Man, MD;  Location: [facility];  Service: Cardiovascular;  Laterality: N/A;  . CATARACT EXTRACTION W/ INTRAOCULAR LENS  IMPLANT, BILATERAL    . CORONARY ANGIOPLASTY    . CORONARY ARTERY BYPASS GRAFT  1987   [place], [place] - LIMA to LAD, SVG to RCA presumably  . LEFT HEART CATH AND CORS/GRAFTS ANGIOGRAPHY N/A 05/12/2017   Procedure: LEFT HEART CATH AND CORS/GRAFTS ANGIOGRAPHY;  Surgeon: Burnell Blanks, MD;  Location: [place] [facility];  Service: Cardiovascular;  Laterality: N/A;  . LEFT HEART CATHETERIZATION WITH CORONARY/GRAFT ANGIOGRAM  06/24/2011   Procedure: LEFT HEART CATHETERIZATION WITH Beatrix Fetters;  Surgeon: Burnell Blanks, MD;  Location: [facility] CATH LAB;  Service: Cardiovascular;;  . LEFT HEART CATHETERIZATION WITH CORONARY/GRAFT ANGIOGRAM N/A 02/26/2013   Procedure: LEFT HEART CATHETERIZATION WITH Beatrix Fetters;  Surgeon: Blane Ohara, MD;  Location: [facility] CATH LAB;  Service: Cardiovascular;  Laterality: N/A;  . LOWER  EXTREMITY ANGIOGRAM N/A 10/30/2011   Procedure: LOWER EXTREMITY ANGIOGRAM;  Surgeon: Sherren Mocha, MD;  Location: [facility] CATH LAB;  Service: Cardiovascular;  Laterality: N/A;  . PERCUTANEOUS CORONARY STENT INTERVENTION (PCI-S)  02/26/2013   Procedure: PERCUTANEOUS CORONARY STENT INTERVENTION (PCI-S);  Surgeon: Blane Ohara, MD;  Location: [facility] CATH LAB;  Service: Cardiovascular;;    Current Outpatient Medications  Medication Sig Dispense Refill  . acetaminophen (TYLENOL) 325 MG tablet Take 650 mg by mouth every 6 (six) hours as needed for moderate pain or headache.    Marland Kitchen aspirin EC 81 MG tablet Take 1 tablet (81 mg total) by mouth daily. 90 tablet 3  . carvedilol (COREG) 12.5 MG tablet TAKE 1 & 1/2 TABLETS TWICE DAILY WITH MEAL. 90 tablet 6  . clopidogrel (PLAVIX) 75 MG tablet Take 1 tablet (75 mg total) by mouth daily. 90 tablet 1  . furosemide (LASIX) 40 MG tablet TAKE 1 TABLET ONCE DAILY. 30 tablet 6  . gabapentin (NEURONTIN) 100 MG capsule Take 100 mg by mouth 3 (three) times daily.     Marland Kitchen LORazepam (ATIVAN) 1 MG tablet Take 1 mg by mouth 2 (two) times daily as needed for anxiety (sleep).     . losartan (COZAAR) 50 MG tablet Take 50 mg by mouth once daily 90 tablet 3  . Multiple Vitamin (MULTIVITAMIN) tablet Take 1 tablet by mouth daily.    . nitroGLYCERIN (NITROSTAT) 0.4 MG SL tablet Place 1 tablet (0.4 mg total) under the tongue every 5 (five) minutes as needed. For chest pain 25 tablet 3  . Omega-3 Fatty Acids (FISH OIL) 1000 MG CAPS Take 2,000 mg by mouth daily.    Marland Kitchen OVER THE COUNTER MEDICATION Suppository to help with BM with Occasional fleet enema    . pantoprazole (PROTONIX) 40 MG tablet TAKE 1 TABLET 2 TIMES A DAY BEFORE A MEAL. 180 tablet 3  . ranitidine (ZANTAC) 150 MG tablet Take 150 mg by mouth 2 (two) times daily.    . ranolazine (RANEXA) 500 MG 12 hr tablet TAKE (1) TABLET TWICE DAILY. 180 tablet 3  . rosuvastatin (CRESTOR) 5 MG tablet Take 1 tablet (5 mg total) by mouth daily. 90  tablet 3   No current facility-administered medications for this visit.     Allergies as of 02/09/2018  . (No Known Allergies)    Family History  Problem Relation Age of Onset  . CAD Unknown        pt. says he is unsure of the relationship  . Hypertension Unknown   . Colon cancer Neg Hx   . Gastric cancer Neg Hx   . Esophageal cancer Neg Hx     Social History   Socioeconomic History  . Marital status: Widowed    Spouse name: Not on file  . Number of children: Not on file  . Years of education: Not on file  . Highest education level: Not on file  Occupational History  . Occupation: Retired    Comment: Orthoptist  Social Needs  . Financial resource strain: Not on file  . Food insecurity:    Worry: Not on file    Inability: Not on file  . Transportation needs:    Medical: Not on file    Non-medical: Not on file  Tobacco Use  . Smoking status: Former Smoker    Packs/day: 2.00    Years: 40.00    Pack years: 80.00    Types: Cigarettes    Last attempt to quit: 05/27/1993    Years since quitting: 24.7  . Smokeless tobacco: Never Used  Substance and Sexual Activity  . Alcohol use: No    Alcohol/week: 0.0 standard drinks  . Drug use: No  . Sexual activity: Not Currently  Lifestyle  . Physical activity:    Days per week: Not on file    Minutes per session: Not on file  . Stress: Not on file  Relationships  . Social connections:    Talks on phone: Not on file    Gets together: Not on file    Attends religious service: Not on file    Active member of club or organization: Not on file    Attends meetings of clubs or organizations: Not on file    Relationship status: Not on file  Other Topics Concern  . Not on file  Social History Narrative  . Not on file    Review of Systems: General: Negative for anorexia, weight loss, fever, chills, fatigue, weakness. CV: Negative for chest pain, angina, palpitations, dperipheral edema.  Respiratory: Negative for dyspnea at  rest, cough, sputum, wheezing.  GI: See history of present illness. Endo: Negative for unusual weight change.  Heme: Negative for bruising or bleeding.   Physical Exam: BP 100/61   Pulse 77   Temp 98.2 F (36.8 C) (Oral)   Ht 5\' 5"  (1.651 m)   Wt 168 lb 9.6 oz (76.5 kg)   BMI 28.06 kg/m  General:   Alert and oriented. Pleasant and cooperative. Well-nourished and well-developed.  Eyes:  Without icterus, sclera clear and conjunctiva pink.  Ears:  Normal auditory acuity. Cardiovascular:  S1, S2 present without murmurs appreciated. Extremities without clubbing or edema. Respiratory:  Clear to auscultation bilaterally. No wheezes, rales, or rhonchi. No distress.  Gastrointestinal:  +BS, soft, non-tender and non-distended. No HSM noted. No guarding or rebound. No masses appreciated.  Rectal:  Deferred  Musculoskalatal:  Symmetrical without gross deformities. Neurologic:  Alert and oriented x4;  grossly normal neurologically. Psych:  Alert and cooperative. Normal mood and affect. Heme/Lymph/Immune: No excessive bruising noted.    02/09/2018 11:03 AM   Disclaimer: This note was dictated with voice recognition software. Similar sounding words can inadvertently be transcribed and may not be corrected upon review.

## 2018-02-09 NOTE — Progress Notes (Signed)
CC'D TO PCP °

## 2018-02-24 DIAGNOSIS — M5030 Other cervical disc degeneration, unspecified cervical region: Secondary | ICD-10-CM | POA: Diagnosis not present

## 2018-02-24 DIAGNOSIS — M5136 Other intervertebral disc degeneration, lumbar region: Secondary | ICD-10-CM | POA: Diagnosis not present

## 2018-03-09 DIAGNOSIS — M79672 Pain in left foot: Secondary | ICD-10-CM | POA: Diagnosis not present

## 2018-03-09 DIAGNOSIS — M25579 Pain in unspecified ankle and joints of unspecified foot: Secondary | ICD-10-CM | POA: Diagnosis not present

## 2018-03-09 DIAGNOSIS — M79671 Pain in right foot: Secondary | ICD-10-CM | POA: Diagnosis not present

## 2018-03-12 DIAGNOSIS — M5033 Other cervical disc degeneration, cervicothoracic region: Secondary | ICD-10-CM | POA: Diagnosis not present

## 2018-03-12 DIAGNOSIS — M5030 Other cervical disc degeneration, unspecified cervical region: Secondary | ICD-10-CM | POA: Diagnosis not present

## 2018-03-13 DIAGNOSIS — Z6828 Body mass index (BMI) 28.0-28.9, adult: Secondary | ICD-10-CM | POA: Diagnosis not present

## 2018-03-13 DIAGNOSIS — M47816 Spondylosis without myelopathy or radiculopathy, lumbar region: Secondary | ICD-10-CM | POA: Diagnosis not present

## 2018-04-02 DIAGNOSIS — M5136 Other intervertebral disc degeneration, lumbar region: Secondary | ICD-10-CM | POA: Diagnosis not present

## 2018-04-06 DIAGNOSIS — M79672 Pain in left foot: Secondary | ICD-10-CM | POA: Diagnosis not present

## 2018-04-06 DIAGNOSIS — M25579 Pain in unspecified ankle and joints of unspecified foot: Secondary | ICD-10-CM | POA: Diagnosis not present

## 2018-04-06 DIAGNOSIS — M79671 Pain in right foot: Secondary | ICD-10-CM | POA: Diagnosis not present

## 2018-04-16 DIAGNOSIS — M5416 Radiculopathy, lumbar region: Secondary | ICD-10-CM | POA: Diagnosis not present

## 2018-04-16 DIAGNOSIS — M545 Low back pain: Secondary | ICD-10-CM | POA: Diagnosis not present

## 2018-04-27 DIAGNOSIS — M79671 Pain in right foot: Secondary | ICD-10-CM | POA: Diagnosis not present

## 2018-04-27 DIAGNOSIS — M79672 Pain in left foot: Secondary | ICD-10-CM | POA: Diagnosis not present

## 2018-04-27 DIAGNOSIS — M25579 Pain in unspecified ankle and joints of unspecified foot: Secondary | ICD-10-CM | POA: Diagnosis not present

## 2018-04-28 ENCOUNTER — Telehealth: Payer: Self-pay

## 2018-04-28 DIAGNOSIS — K219 Gastro-esophageal reflux disease without esophagitis: Secondary | ICD-10-CM

## 2018-04-28 MED ORDER — PANTOPRAZOLE SODIUM 40 MG PO TBEC: DELAYED_RELEASE_TABLET | ORAL | 3 refills | Status: DC

## 2018-04-28 NOTE — Telephone Encounter (Signed)
Received fax from [facility] requesting 90 day supply of Pantoprazole 40mg  be sent to [facility].

## 2018-04-28 NOTE — Telephone Encounter (Signed)
Done

## 2018-05-11 DIAGNOSIS — R509 Fever, unspecified: Secondary | ICD-10-CM | POA: Diagnosis not present

## 2018-05-11 DIAGNOSIS — J019 Acute sinusitis, unspecified: Secondary | ICD-10-CM | POA: Diagnosis not present

## 2018-05-11 DIAGNOSIS — Z6828 Body mass index (BMI) 28.0-28.9, adult: Secondary | ICD-10-CM | POA: Diagnosis not present

## 2018-05-28 DIAGNOSIS — I10 Essential (primary) hypertension: Secondary | ICD-10-CM | POA: Diagnosis not present

## 2018-05-28 DIAGNOSIS — K219 Gastro-esophageal reflux disease without esophagitis: Secondary | ICD-10-CM | POA: Diagnosis not present

## 2018-05-28 DIAGNOSIS — Z1389 Encounter for screening for other disorder: Secondary | ICD-10-CM | POA: Diagnosis not present

## 2018-05-28 DIAGNOSIS — I739 Peripheral vascular disease, unspecified: Secondary | ICD-10-CM | POA: Diagnosis not present

## 2018-05-28 DIAGNOSIS — F419 Anxiety disorder, unspecified: Secondary | ICD-10-CM | POA: Diagnosis not present

## 2018-06-01 DIAGNOSIS — M79672 Pain in left foot: Secondary | ICD-10-CM | POA: Diagnosis not present

## 2018-06-01 DIAGNOSIS — Z0001 Encounter for general adult medical examination with abnormal findings: Secondary | ICD-10-CM | POA: Diagnosis not present

## 2018-06-01 DIAGNOSIS — Z6827 Body mass index (BMI) 27.0-27.9, adult: Secondary | ICD-10-CM | POA: Diagnosis not present

## 2018-06-01 DIAGNOSIS — M79671 Pain in right foot: Secondary | ICD-10-CM | POA: Diagnosis not present

## 2018-06-01 DIAGNOSIS — M25579 Pain in unspecified ankle and joints of unspecified foot: Secondary | ICD-10-CM | POA: Diagnosis not present

## 2018-06-22 ENCOUNTER — Other Ambulatory Visit: Payer: Self-pay | Admitting: Cardiology

## 2018-06-29 DIAGNOSIS — M25579 Pain in unspecified ankle and joints of unspecified foot: Secondary | ICD-10-CM | POA: Diagnosis not present

## 2018-06-29 DIAGNOSIS — M79671 Pain in right foot: Secondary | ICD-10-CM | POA: Diagnosis not present

## 2018-06-29 DIAGNOSIS — M79672 Pain in left foot: Secondary | ICD-10-CM | POA: Diagnosis not present

## 2018-06-30 DIAGNOSIS — M5136 Other intervertebral disc degeneration, lumbar region: Secondary | ICD-10-CM | POA: Diagnosis not present

## 2018-07-15 ENCOUNTER — Encounter: Payer: Self-pay | Admitting: *Deleted

## 2018-07-15 NOTE — Progress Notes (Signed)
Cardiology Office Note  Date: 07/16/2018   ID: Hunter, Townsend 1941-05-11, MRN [id number]  PCP: Rory Percy, MD  Primary Cardiologist: Rozann Lesches, MD   Chief Complaint  Patient presents with  . Coronary Artery Disease    History of Present Illness: Hunter Townsend is a 78 y.o. male last seen in August 2019.  He is here for a routine follow-up visit.  He has had no progressive angina symptoms or nitroglycerin use in the interim.  Main limitation is chronic back and leg pain.  He has pain control injections pending and will be off antiplatelet regimen temporarily.  I reviewed his cardiac medications otherwise, refills provided as needed.  He is still following at [facility] for primary care.  Past Medical History:  Diagnosis Date  . Arthritis   . BPH (benign prostatic hyperplasia)   . CHF (congestive heart failure) ([place])   . Coronary atherosclerosis of native coronary artery    Multivessel, NSTEMI  s/p PTCA/DES to mid LAD 06/24/11,, known patent LIMA-LAD and occlusion of all vein grafts  . Hypertension   . Ischemic cardiomyopathy    EF 35-40% by cath 06/24/11 (EF 37% by nuclear stress test 06/2010).  . Myocardial infarction ([place]) 2013  . PAD (peripheral artery disease) (H[facility])    Status post left SFA stent - Dr. Burt Knack    Past Surgical History:  Procedure Laterality Date  . BACK SURGERY     "cut me ~ 1/2inch to take sciatic nerve out of my right leg"  . CARDIAC CATHETERIZATION  03/28/2016  . CARDIAC CATHETERIZATION N/A 03/28/2016   Procedure: Left Heart Cath and Cors/Grafts Angiography;  Surgeon: Leonie Man, MD;  Location: [facility];  Service: Cardiovascular;  Laterality: N/A;  . CATARACT EXTRACTION W/ INTRAOCULAR LENS  IMPLANT, BILATERAL    . CORONARY ANGIOPLASTY    . CORONARY ARTERY BYPASS GRAFT  1987   [place], [place] - LIMA to LAD, SVG to RCA presumably  . LEFT HEART CATH AND CORS/GRAFTS ANGIOGRAPHY N/A 05/12/2017   Procedure: LEFT HEART CATH  AND CORS/GRAFTS ANGIOGRAPHY;  Surgeon: Burnell Blanks, MD;  Location: [facility] [place] [facility] [place] [facility];  Service: Cardiovascular;  Laterality: N/A;  . LEFT HEART CATHETERIZATION WITH CORONARY/GRAFT ANGIOGRAM  06/24/2011   Procedure: LEFT HEART CATHETERIZATION WITH Beatrix Fetters;  Surgeon: Burnell Blanks, MD;  Location: [facility] CATH LAB;  Service: Cardiovascular;;  . LEFT HEART CATHETERIZATION WITH CORONARY/GRAFT ANGIOGRAM N/A 02/26/2013   Procedure: LEFT HEART CATHETERIZATION WITH Beatrix Fetters;  Surgeon: Blane Ohara, MD;  Location: [facility] CATH LAB;  Service: Cardiovascular;  Laterality: N/A;  . LOWER EXTREMITY ANGIOGRAM N/A 10/30/2011   Procedure: LOWER EXTREMITY ANGIOGRAM;  Surgeon: Sherren Mocha, MD;  Location: [facility] CATH LAB;  Service: Cardiovascular;  Laterality: N/A;  . PERCUTANEOUS CORONARY STENT INTERVENTION (PCI-S)  02/26/2013   Procedure: PERCUTANEOUS CORONARY STENT INTERVENTION (PCI-S);  Surgeon: Blane Ohara, MD;  Location: [facility] CATH LAB;  Service: Cardiovascular;;    Current Outpatient Medications  Medication Sig Dispense Refill  . acetaminophen (TYLENOL) 325 MG tablet Take 650 mg by mouth every 6 (six) hours as needed for moderate pain or headache.    Marland Kitchen aspirin EC 81 MG tablet Take 1 tablet (81 mg total) by mouth daily. 90 tablet 3  . carvedilol (COREG) 12.5 MG tablet TAKE 1 & 1/2 TABLETS TWICE DAILY WITH MEAL. 270 tablet 2  . clopidogrel (PLAVIX) 75 MG tablet Take 1 tablet (75 mg total) by mouth daily. 90 tablet 2  .  furosemide (LASIX) 40 MG tablet Take 1 tablet (40 mg total) by mouth daily. 90 tablet 2  . gabapentin (NEURONTIN) 100 MG capsule Take 100 mg by mouth 3 (three) times daily.     Marland Kitchen LORazepam (ATIVAN) 1 MG tablet Take 1 mg by mouth 2 (two) times daily as needed for anxiety (sleep).     . losartan (COZAAR) 50 MG tablet Take 50 mg by mouth once daily 90 tablet 3  . Multiple Vitamin (MULTIVITAMIN) tablet Take 1 tablet by mouth daily.    . nitroGLYCERIN  (NITROSTAT) 0.4 MG SL tablet Place 1 tablet (0.4 mg total) under the tongue every 5 (five) minutes x 3 doses as needed for chest pain (if no relief after 3rd dose, proceed to the ED for an evaluation or call 911). For chest pain 25 tablet 3  . Omega-3 Fatty Acids (FISH OIL) 1000 MG CAPS Take 2,000 mg by mouth daily.    Marland Kitchen OVER THE COUNTER MEDICATION Suppository to help with BM with Occasional fleet enema    . pantoprazole (PROTONIX) 40 MG tablet TAKE 1 TABLET 2 TIMES A DAY BEFORE A MEAL. 180 tablet 3  . ranitidine (ZANTAC) 150 MG tablet Take 150 mg by mouth 2 (two) times daily.    . ranolazine (RANEXA) 500 MG 12 hr tablet TAKE (1) TABLET TWICE DAILY. 180 tablet 3  . rosuvastatin (CRESTOR) 5 MG tablet Take 1 tablet (5 mg total) by mouth daily. 90 tablet 2   No current facility-administered medications for this visit.    Allergies:  Patient has no known allergies.   Social History: The patient  reports that he quit smoking about 25 years ago. His smoking use included cigarettes. He has a 80.00 pack-year smoking history. He has never used smokeless tobacco. He reports that he does not drink alcohol or use drugs.   ROS:  Please see the history of present illness. Otherwise, complete review of systems is positive for chronic back and leg pain.  All other systems are reviewed and negative.   Physical Exam: VS:  BP (!) 101/58   Pulse 79   Ht 5\' 5"  (1.651 m)   Wt 166 lb 6.4 oz (75.5 kg)   SpO2 99%   BMI 27.69 kg/m , BMI Body mass index is 27.69 kg/m.  Wt Readings from Last 3 Encounters:  07/16/18 166 lb 6.4 oz (75.5 kg)  02/09/18 168 lb 9.6 oz (76.5 kg)  01/16/18 168 lb (76.2 kg)    General: Patient appears comfortable at rest. HEENT: Conjunctiva and lids normal, oropharynx clear. Neck: Supple, no elevated JVP or carotid bruits, no thyromegaly. Lungs: Clear to auscultation, nonlabored breathing at rest. Cardiac: Regular rate and rhythm, no S3, soft systolic murmur. Abdomen: Soft,  nontender, bowel sounds present. Extremities: No pitting edema, distal pulses 2+. Skin: Warm and dry. Musculoskeletal: No kyphosis. Neuropsychiatric: Alert and oriented x3, affect grossly appropriate.  ECG: I personally reviewed the tracing from 01/16/2018 which showed sinus rhythm with significant lead artifact, fusion beats, PVCs, nonspecific ST-T changes.  Recent Labwork: No results found for requested labs within last 8760 hours.     Component Value Date/Time   CHOL 152 06/25/2011 0400   TRIG 150 (H) 06/25/2011 0400   HDL 28 (L) 06/25/2011 0400   CHOLHDL 5.4 06/25/2011 0400   VLDL 30 06/25/2011 0400   LDLCALC 94 06/25/2011 0400  December 2018: Potassium 4.6, BUN 13, creatinine 1.27, hemoglobin 12.3, platelets 285  Other Studies Reviewed Today:  Cardiac catheterization 05/12/2017:  Prox RCA lesion is 100% stenosed.  Dist LM to Ost LAD lesion is 100% stenosed.  LIMA graft was visualized by angiography and is normal in caliber.  Dist Graft to Insertion lesion is 20% stenosed.  Mid LAD lesion is 30% stenosed.  Prox Cx to Mid Cx lesion is 50% stenosed.  Ost 2nd Mrg to 2nd Mrg lesion is 50% stenosed.  Ost 3rd Mrg lesion is 100% stenosed.  Origin lesion is 100% stenosed.  Origin lesion is 100% stenosed.  SVG graft was not visualized.  SVG graft was not visualized.  1. 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 Presumed that the vein grafts supplied the RCA and OM branches and have been chronically occluded.  2. The ostial LAD is chronically occluded. The LIMA graft to the LAD is patent. The stented segment of the graft at the anastomosis to the LAD is patent with minimal restenosis. The mid LAD stent just beyond the anastomosis is patent with minimal restenosis.  3. The Circumflex artery is patent with moderate diffuse disease in the proximal and mid vessel. There are several small caliber OM branches with diffuse  severe disease. The third Om branch has moderate non-obstructive disease. The 4th OM branch is now occluded. This is a new finding. This branch fills from left to left collaterals.  4. The RCA is chronically occluded in the proximal segment. The vein graft to the PDA is occluded. The mid and distal RCA fills briskly from left to right collaterals.  5. Mild elevation of LV filling pressure  Recommendations: Continue medical management of CAD. He has diffuse disease with no focal targets for PCI.  Assessment and Plan:  1.  Multivessel CAD status post CABG with graft disease and subsequent DES intervention as detailed above.  He is stable without progressive angina on current medical therapy.  Ranexa has been helpful.  No changes were made today.  2.  Ischemic cardiomyopathy with LVEF approximately 45%.  No evidence of fluid overload.  Continue current diuretic regimen along with Coreg and Cozaar.  3.  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  Following lab work with [facility].  4.  Essential hypertension, blood pressure well controlled today.  Current medicines were reviewed with the patient today.  Disposition: Follow-up in 6 months.  Signed, Satira Sark, MD, [facility] 07/16/2018 11:55 AM    [facility] [address], [place], [place] [address] Phone: [phone]; Fax: [phone]

## 2018-07-16 ENCOUNTER — Encounter: Payer: Self-pay | Admitting: Cardiology

## 2018-07-16 ENCOUNTER — Ambulatory Visit (INDEPENDENT_AMBULATORY_CARE_PROVIDER_SITE_OTHER): Payer: Medicare Other | Admitting: Cardiology

## 2018-07-16 VITALS — BP 101/58 | HR 79 | Ht 65.0 in | Wt 166.4 lb

## 2018-07-16 DIAGNOSIS — I10 Essential (primary) hypertension: Secondary | ICD-10-CM

## 2018-07-16 DIAGNOSIS — E782 Mixed hyperlipidemia: Secondary | ICD-10-CM

## 2018-07-16 DIAGNOSIS — I25119 Atherosclerotic heart disease of native coronary artery with unspecified angina pectoris: Secondary | ICD-10-CM | POA: Diagnosis not present

## 2018-07-16 DIAGNOSIS — M47812 Spondylosis without myelopathy or radiculopathy, cervical region: Secondary | ICD-10-CM | POA: Diagnosis not present

## 2018-07-16 DIAGNOSIS — M47816 Spondylosis without myelopathy or radiculopathy, lumbar region: Secondary | ICD-10-CM | POA: Diagnosis not present

## 2018-07-16 DIAGNOSIS — I255 Ischemic cardiomyopathy: Secondary | ICD-10-CM | POA: Diagnosis not present

## 2018-07-16 MED ORDER — CLOPIDOGREL BISULFATE 75 MG PO TABS: 75.0000 mg | ORAL_TABLET | Freq: Every day | ORAL | 2 refills | Status: DC

## 2018-07-16 MED ORDER — NITROGLYCERIN 0.4 MG SL SUBL: 0.4000 mg | SUBLINGUAL_TABLET | SUBLINGUAL | 3 refills | Status: DC | PRN

## 2018-07-16 MED ORDER — ROSUVASTATIN CALCIUM 5 MG PO TABS: 5.0000 mg | ORAL_TABLET | Freq: Every day | ORAL | 2 refills | Status: DC

## 2018-07-16 MED ORDER — FUROSEMIDE 40 MG PO TABS: 40.0000 mg | ORAL_TABLET | Freq: Every day | ORAL | 2 refills | Status: DC

## 2018-07-16 NOTE — Patient Instructions (Addendum)

## 2018-07-21 DIAGNOSIS — M5030 Other cervical disc degeneration, unspecified cervical region: Secondary | ICD-10-CM | POA: Diagnosis not present

## 2018-07-27 DIAGNOSIS — M79672 Pain in left foot: Secondary | ICD-10-CM | POA: Diagnosis not present

## 2018-07-27 DIAGNOSIS — M79671 Pain in right foot: Secondary | ICD-10-CM | POA: Diagnosis not present

## 2018-07-27 DIAGNOSIS — M25579 Pain in unspecified ankle and joints of unspecified foot: Secondary | ICD-10-CM | POA: Diagnosis not present

## 2018-08-10 ENCOUNTER — Ambulatory Visit: Payer: Medicare Other | Admitting: Nurse Practitioner

## 2018-08-10 ENCOUNTER — Encounter: Payer: Self-pay | Admitting: Internal Medicine

## 2018-08-10 NOTE — Progress Notes (Deleted)
Referring Provider: Rory Percy, MD Primary Care Physician:  Rory Percy, MD Primary GI:  Dr. Gala Romney  No chief complaint on file.   HPI:   Hunter Townsend is a 78 y.o. male who presents for follow-up on GERD and constipation.  The patient was last seen in our office 02/09/2018 for the same.  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  Colonoscopy up-to-date no recommended repeat due to age.  At his last visit GERD doing well without breakthrough on PPI.  Constipation about the same, Amitiza 24 mcg caused diarrhea for 4 days.  His constipation is intermittent and uses a suppository which resolves his symptoms.  Only needs suppository about once every 3 weeks.  Some lower abdominal discomfort which resolves after bowel movement.  No other GI complaints.  Recommended continue bowel regimen, continue PPI, follow-up in 6 months.  Today he states   Past Medical History:  Diagnosis Date  . Arthritis   . BPH (benign prostatic hyperplasia)   . CHF (congestive heart failure) ([place])   . Coronary atherosclerosis of native coronary artery    Multivessel, NSTEMI  s/p PTCA/DES to mid LAD 06/24/11,, known patent LIMA-LAD and occlusion of all vein grafts  . Hypertension   . Ischemic cardiomyopathy    EF 35-40% by cath 06/24/11 (EF 37% by nuclear stress test 06/2010).  . Myocardial infarction ([place]) 2013  . PAD (peripheral artery disease) (H[facility])    Status post left SFA stent - Dr. Burt Knack    Past Surgical History:  Procedure Laterality Date  . BACK SURGERY     "cut me ~ 1/2inch to take sciatic nerve out of my right leg"  . CARDIAC CATHETERIZATION  03/28/2016  . CARDIAC CATHETERIZATION N/A 03/28/2016   Procedure: Left Heart Cath and Cors/Grafts Angiography;  Surgeon: Leonie Man, MD;  Location: [facility];  Service: Cardiovascular;  Laterality: N/A;  . CATARACT EXTRACTION W/ INTRAOCULAR LENS  IMPLANT, BILATERAL    . CORONARY ANGIOPLASTY    . CORONARY ARTERY BYPASS GRAFT  1987   [place], [place] - LIMA to LAD, SVG to RCA presumably  . LEFT HEART CATH AND CORS/GRAFTS ANGIOGRAPHY N/A 05/12/2017   Procedure: LEFT HEART CATH AND CORS/GRAFTS ANGIOGRAPHY;  Surgeon: Burnell Blanks, MD;  Location: [place] [facility];  Service: Cardiovascular;  Laterality: N/A;  . LEFT HEART CATHETERIZATION WITH CORONARY/GRAFT ANGIOGRAM  06/24/2011   Procedure: LEFT HEART CATHETERIZATION WITH Beatrix Fetters;  Surgeon: Burnell Blanks, MD;  Location: [facility] CATH LAB;  Service: Cardiovascular;;  . LEFT HEART CATHETERIZATION WITH CORONARY/GRAFT ANGIOGRAM N/A 02/26/2013   Procedure: LEFT HEART CATHETERIZATION WITH Beatrix Fetters;  Surgeon: Blane Ohara, MD;  Location: [facility] CATH LAB;  Service: Cardiovascular;  Laterality: N/A;  . LOWER EXTREMITY ANGIOGRAM N/A 10/30/2011   Procedure: LOWER EXTREMITY ANGIOGRAM;  Surgeon: Sherren Mocha, MD;  Location: [facility] CATH LAB;  Service: Cardiovascular;  Laterality: N/A;  . PERCUTANEOUS CORONARY STENT INTERVENTION (PCI-S)  02/26/2013   Procedure: PERCUTANEOUS CORONARY STENT INTERVENTION (PCI-S);  Surgeon: Blane Ohara, MD;  Location: [facility] CATH LAB;  Service: Cardiovascular;;    Current Outpatient Medications  Medication Sig Dispense Refill  . acetaminophen (TYLENOL) 325 MG tablet Take 650 mg by mouth every 6 (six) hours as needed for moderate pain or headache.    Marland Kitchen aspirin EC 81 MG tablet Take 1 tablet (81 mg total) by mouth daily. 90 tablet 3  . carvedilol (COREG) 12.5 MG tablet TAKE 1 & 1/2 TABLETS TWICE DAILY WITH  MEAL. 270 tablet 2  . clopidogrel (PLAVIX) 75 MG tablet Take 1 tablet (75 mg total) by mouth daily. 90 tablet 2  . furosemide (LASIX) 40 MG tablet Take 1 tablet (40 mg total) by mouth daily. 90 tablet 2  . gabapentin (NEURONTIN) 100 MG capsule Take 100 mg by mouth 3 (three) times daily.     Marland Kitchen LORazepam (ATIVAN) 1 MG tablet Take 1 mg by mouth 2 (two) times daily as needed for anxiety (sleep).     . losartan (COZAAR) 50 MG  tablet Take 50 mg by mouth once daily 90 tablet 3  . Multiple Vitamin (MULTIVITAMIN) tablet Take 1 tablet by mouth daily.    . nitroGLYCERIN (NITROSTAT) 0.4 MG SL tablet Place 1 tablet (0.4 mg total) under the tongue every 5 (five) minutes x 3 doses as needed for chest pain (if no relief after 3rd dose, proceed to the ED for an evaluation or call 911). For chest pain 25 tablet 3  . Omega-3 Fatty Acids (FISH OIL) 1000 MG CAPS Take 2,000 mg by mouth daily.    Marland Kitchen OVER THE COUNTER MEDICATION Suppository to help with BM with Occasional fleet enema    . pantoprazole (PROTONIX) 40 MG tablet TAKE 1 TABLET 2 TIMES A DAY BEFORE A MEAL. 180 tablet 3  . ranitidine (ZANTAC) 150 MG tablet Take 150 mg by mouth 2 (two) times daily.    . ranolazine (RANEXA) 500 MG 12 hr tablet TAKE (1) TABLET TWICE DAILY. 180 tablet 3  . rosuvastatin (CRESTOR) 5 MG tablet Take 1 tablet (5 mg total) by mouth daily. 90 tablet 2   No current facility-administered medications for this visit.     Allergies as of 08/10/2018  . (No Known Allergies)    Family History  Problem Relation Age of Onset  . CAD Other        pt. says he is unsure of the relationship  . Hypertension Other   . Colon cancer Neg Hx   . Gastric cancer Neg Hx   . Esophageal cancer Neg Hx     Social History   Socioeconomic History  . Marital status: Widowed    Spouse name: Not on file  . Number of children: Not on file  . Years of education: Not on file  . Highest education level: Not on file  Occupational History  . Occupation: Retired    Comment: Paediatric nurse  . Financial resource strain: Not on file  . Food insecurity:    Worry: Not on file    Inability: Not on file  . Transportation needs:    Medical: Not on file    Non-medical: Not on file  Tobacco Use  . Smoking status: Former Smoker    Packs/day: 2.00    Years: 40.00    Pack years: 80.00    Types: Cigarettes    Last attempt to quit: 05/27/1993    Years since quitting: 25.2   . Smokeless tobacco: Never Used  Substance and Sexual Activity  . Alcohol use: No    Alcohol/week: 0.0 standard drinks  . Drug use: No  . Sexual activity: Not Currently  Lifestyle  . Physical activity:    Days per week: Not on file    Minutes per session: Not on file  . Stress: Not on file  Relationships  . Social connections:    Talks on phone: Not on file    Gets together: Not on file    Attends religious service:  Not on file    Active member of club or organization: Not on file    Attends meetings of clubs or organizations: Not on file    Relationship status: Not on file  Other Topics Concern  . Not on file  Social History Narrative  . Not on file    Review of Systems: General: Negative for anorexia, weight loss, fever, chills, fatigue, weakness. Eyes: Negative for vision changes.  ENT: Negative for hoarseness, difficulty swallowing , nasal congestion. CV: Negative for chest pain, angina, palpitations, dyspnea on exertion, peripheral edema.  Respiratory: Negative for dyspnea at rest, dyspnea on exertion, cough, sputum, wheezing.  GI: See history of present illness. GU:  Negative for dysuria, hematuria, urinary incontinence, urinary frequency, nocturnal urination.  MS: Negative for joint pain, low back pain.  Derm: Negative for rash or itching.  Neuro: Negative for weakness, abnormal sensation, seizure, frequent headaches, memory loss, confusion.  Psych: Negative for anxiety, depression, suicidal ideation, hallucinations.  Endo: Negative for unusual weight change.  Heme: Negative for bruising or bleeding. Allergy: Negative for rash or hives.   Physical Exam: There were no vitals taken for this visit. General:   Alert and oriented. Pleasant and cooperative. Well-nourished and well-developed.  Head:  Normocephalic and atraumatic. Eyes:  Without icterus, sclera clear and conjunctiva pink.  Ears:  Normal auditory acuity. Mouth:  No deformity or lesions, oral mucosa  pink.  Throat/Neck:  Supple, without mass or thyromegaly. Cardiovascular:  S1, S2 present without murmurs appreciated. Normal pulses noted. Extremities without clubbing or edema. Respiratory:  Clear to auscultation bilaterally. No wheezes, rales, or rhonchi. No distress.  Gastrointestinal:  +BS, soft, non-tender and non-distended. No HSM noted. No guarding or rebound. No masses appreciated.  Rectal:  Deferred  Musculoskalatal:  Symmetrical without gross deformities. Normal posture. Skin:  Intact without significant lesions or rashes. Neurologic:  Alert and oriented x4;  grossly normal neurologically. Psych:  Alert and cooperative. Normal mood and affect. Heme/Lymph/Immune: No significant cervical adenopathy. No excessive bruising noted.    08/10/2018 8:11 AM   Disclaimer: This note was dictated with voice recognition software. Similar sounding words can inadvertently be transcribed and may not be corrected upon review.

## 2018-08-20 ENCOUNTER — Other Ambulatory Visit: Payer: Self-pay | Admitting: Cardiology

## 2018-08-24 DIAGNOSIS — M79672 Pain in left foot: Secondary | ICD-10-CM | POA: Diagnosis not present

## 2018-08-24 DIAGNOSIS — M25579 Pain in unspecified ankle and joints of unspecified foot: Secondary | ICD-10-CM | POA: Diagnosis not present

## 2018-08-24 DIAGNOSIS — M79671 Pain in right foot: Secondary | ICD-10-CM | POA: Diagnosis not present

## 2018-09-11 ENCOUNTER — Other Ambulatory Visit: Payer: Self-pay

## 2018-09-11 MED ORDER — RANOLAZINE ER 500 MG PO TB12: ORAL_TABLET | ORAL | 3 refills | Status: DC

## 2018-09-11 NOTE — Telephone Encounter (Signed)
Refilled Ranexa per fax request

## 2018-09-29 DIAGNOSIS — M25579 Pain in unspecified ankle and joints of unspecified foot: Secondary | ICD-10-CM | POA: Diagnosis not present

## 2018-09-29 DIAGNOSIS — M79672 Pain in left foot: Secondary | ICD-10-CM | POA: Diagnosis not present

## 2018-09-29 DIAGNOSIS — M79671 Pain in right foot: Secondary | ICD-10-CM | POA: Diagnosis not present

## 2018-10-20 ENCOUNTER — Other Ambulatory Visit: Payer: Self-pay | Admitting: Cardiology

## 2018-10-27 DIAGNOSIS — M79671 Pain in right foot: Secondary | ICD-10-CM | POA: Diagnosis not present

## 2018-10-27 DIAGNOSIS — M25579 Pain in unspecified ankle and joints of unspecified foot: Secondary | ICD-10-CM | POA: Diagnosis not present

## 2018-10-27 DIAGNOSIS — M79672 Pain in left foot: Secondary | ICD-10-CM | POA: Diagnosis not present

## 2018-10-28 DIAGNOSIS — M4722 Other spondylosis with radiculopathy, cervical region: Secondary | ICD-10-CM | POA: Diagnosis not present

## 2018-10-28 DIAGNOSIS — Z6828 Body mass index (BMI) 28.0-28.9, adult: Secondary | ICD-10-CM | POA: Diagnosis not present

## 2018-10-28 DIAGNOSIS — N183 Chronic kidney disease, stage 3 (moderate): Secondary | ICD-10-CM | POA: Diagnosis not present

## 2018-10-28 DIAGNOSIS — M791 Myalgia, unspecified site: Secondary | ICD-10-CM | POA: Diagnosis not present

## 2018-10-28 DIAGNOSIS — G629 Polyneuropathy, unspecified: Secondary | ICD-10-CM | POA: Diagnosis not present

## 2018-10-28 DIAGNOSIS — M4800 Spinal stenosis, site unspecified: Secondary | ICD-10-CM | POA: Diagnosis not present

## 2018-11-23 DIAGNOSIS — I10 Essential (primary) hypertension: Secondary | ICD-10-CM | POA: Diagnosis not present

## 2018-11-23 DIAGNOSIS — K219 Gastro-esophageal reflux disease without esophagitis: Secondary | ICD-10-CM | POA: Diagnosis not present

## 2018-11-23 DIAGNOSIS — N183 Chronic kidney disease, stage 3 (moderate): Secondary | ICD-10-CM | POA: Diagnosis not present

## 2018-11-26 DIAGNOSIS — Z1389 Encounter for screening for other disorder: Secondary | ICD-10-CM | POA: Diagnosis not present

## 2018-11-26 DIAGNOSIS — M4800 Spinal stenosis, site unspecified: Secondary | ICD-10-CM | POA: Diagnosis not present

## 2018-11-26 DIAGNOSIS — N183 Chronic kidney disease, stage 3 (moderate): Secondary | ICD-10-CM | POA: Diagnosis not present

## 2018-11-26 DIAGNOSIS — G629 Polyneuropathy, unspecified: Secondary | ICD-10-CM | POA: Diagnosis not present

## 2018-11-26 DIAGNOSIS — Z6827 Body mass index (BMI) 27.0-27.9, adult: Secondary | ICD-10-CM | POA: Diagnosis not present

## 2018-11-26 DIAGNOSIS — I255 Ischemic cardiomyopathy: Secondary | ICD-10-CM | POA: Diagnosis not present

## 2018-11-26 DIAGNOSIS — M47816 Spondylosis without myelopathy or radiculopathy, lumbar region: Secondary | ICD-10-CM | POA: Diagnosis not present

## 2018-11-26 DIAGNOSIS — M4722 Other spondylosis with radiculopathy, cervical region: Secondary | ICD-10-CM | POA: Diagnosis not present

## 2018-12-01 DIAGNOSIS — M25579 Pain in unspecified ankle and joints of unspecified foot: Secondary | ICD-10-CM | POA: Diagnosis not present

## 2018-12-01 DIAGNOSIS — M79671 Pain in right foot: Secondary | ICD-10-CM | POA: Diagnosis not present

## 2018-12-01 DIAGNOSIS — M79672 Pain in left foot: Secondary | ICD-10-CM | POA: Diagnosis not present

## 2018-12-23 DIAGNOSIS — M5136 Other intervertebral disc degeneration, lumbar region: Secondary | ICD-10-CM | POA: Diagnosis not present

## 2018-12-23 DIAGNOSIS — M5030 Other cervical disc degeneration, unspecified cervical region: Secondary | ICD-10-CM | POA: Diagnosis not present

## 2018-12-31 DIAGNOSIS — M25579 Pain in unspecified ankle and joints of unspecified foot: Secondary | ICD-10-CM | POA: Diagnosis not present

## 2018-12-31 DIAGNOSIS — M79671 Pain in right foot: Secondary | ICD-10-CM | POA: Diagnosis not present

## 2018-12-31 DIAGNOSIS — M79672 Pain in left foot: Secondary | ICD-10-CM | POA: Diagnosis not present

## 2019-01-12 DIAGNOSIS — M5136 Other intervertebral disc degeneration, lumbar region: Secondary | ICD-10-CM | POA: Diagnosis not present

## 2019-01-12 DIAGNOSIS — M5030 Other cervical disc degeneration, unspecified cervical region: Secondary | ICD-10-CM | POA: Diagnosis not present

## 2019-01-28 DIAGNOSIS — M25579 Pain in unspecified ankle and joints of unspecified foot: Secondary | ICD-10-CM | POA: Diagnosis not present

## 2019-01-28 DIAGNOSIS — M79671 Pain in right foot: Secondary | ICD-10-CM | POA: Diagnosis not present

## 2019-01-28 DIAGNOSIS — M79672 Pain in left foot: Secondary | ICD-10-CM | POA: Diagnosis not present

## 2019-02-08 ENCOUNTER — Telehealth: Payer: Self-pay | Admitting: Cardiology

## 2019-02-08 ENCOUNTER — Encounter: Payer: Self-pay | Admitting: Cardiology

## 2019-02-08 ENCOUNTER — Other Ambulatory Visit: Payer: Self-pay

## 2019-02-08 ENCOUNTER — Ambulatory Visit (INDEPENDENT_AMBULATORY_CARE_PROVIDER_SITE_OTHER): Payer: Medicare Other | Admitting: Cardiology

## 2019-02-08 VITALS — BP 109/62 | HR 63 | Ht 65.0 in | Wt 163.4 lb

## 2019-02-08 DIAGNOSIS — I25119 Atherosclerotic heart disease of native coronary artery with unspecified angina pectoris: Secondary | ICD-10-CM | POA: Diagnosis not present

## 2019-02-08 DIAGNOSIS — I10 Essential (primary) hypertension: Secondary | ICD-10-CM | POA: Diagnosis not present

## 2019-02-08 DIAGNOSIS — R0989 Other specified symptoms and signs involving the circulatory and respiratory systems: Secondary | ICD-10-CM | POA: Diagnosis not present

## 2019-02-08 DIAGNOSIS — E782 Mixed hyperlipidemia: Secondary | ICD-10-CM | POA: Diagnosis not present

## 2019-02-08 DIAGNOSIS — I255 Ischemic cardiomyopathy: Secondary | ICD-10-CM | POA: Diagnosis not present

## 2019-02-08 NOTE — Patient Instructions (Signed)
Medication Instructions:  Continue all current medications.  Labwork: none  Testing/Procedures:  Your physician has requested that you have a carotid duplex. This test is an ultrasound of the carotid arteries in your neck. It looks at blood flow through these arteries that supply the brain with blood. Allow one hour for this exam. There are no restrictions or special instructions.  Office will contact with results via phone or letter.    Follow-Up: Your physician wants you to follow up in: 6 months.  You will receive a reminder letter in the mail one-two months in advance.  If you don't receive a letter, please call our office to schedule the follow up appointment   Any Other Special Instructions Will Be Listed Below (If Applicable).  If you need a refill on your cardiac medications before your next appointment, please call your pharmacy.  

## 2019-02-08 NOTE — Telephone Encounter (Signed)
°  Precert needed for:  Carotid   Location: [facility]    Date: Sept 16, 2020

## 2019-02-08 NOTE — Progress Notes (Signed)
Cardiology Office Note  Date: 02/08/2019   ID: Hunter Townsend, DOB [birth year], MRN [id number]  PCP:  Curlene Labrum, MD  Cardiologist:  Rozann Lesches, MD Electrophysiologist:  None   Chief Complaint  Patient presents with  . Cardiac follow-up    History of Present Illness: Hunter Townsend is an 78 y.o. male last seen in February.  He presents for a routine visit.  He does not report any active angina at this time.  He is limited by chronic back and neck pain, still tries to go walking most days to stay loose.  He has also had pain injections.  We went over his medications which are outlined below.  No change from a cardiac perspective.  He does not report any bleeding on aspirin and Plavix.  He has done well on Ranexa in terms of angina control.  He is now following with Dr. Pleas Koch for primary care.  I personally reviewed his ECG today which shows sinus rhythm with intermittent lead artifact, IVCD, nonspecific T wave changes.  Past Medical History:  Diagnosis Date  . Arthritis   . BPH (benign prostatic hyperplasia)   . CHF (congestive heart failure) ([place])   . Coronary atherosclerosis of native coronary artery    Multivessel, NSTEMI  s/p PTCA/DES to mid LAD 06/24/11,, known patent LIMA-LAD and occlusion of all vein grafts  . Hypertension   . Ischemic cardiomyopathy    EF 35-40% by cath 06/24/11 (EF 37% by nuclear stress test 06/2010).  . Myocardial infarction ([place]) 2013  . PAD (peripheral artery disease) (HCC)    Status post left SFA stent - Dr. Burt Knack    Past Surgical History:  Procedure Laterality Date  . BACK SURGERY     "cut me ~ 1/2inch to take sciatic nerve out of my right leg"  . CARDIAC CATHETERIZATION  03/28/2016  . CARDIAC CATHETERIZATION N/A 03/28/2016   Procedure: Left Heart Cath and Cors/Grafts Angiography;  Surgeon: Leonie Man, MD;  Location: [facility];  Service: Cardiovascular;  Laterality: N/A;  . CATARACT EXTRACTION W/ INTRAOCULAR  LENS  IMPLANT, BILATERAL    . CORONARY ANGIOPLASTY    . CORONARY ARTERY BYPASS GRAFT  1987   [place], [place] - LIMA to LAD, SVG to RCA presumably  . LEFT HEART CATH AND CORS/GRAFTS ANGIOGRAPHY N/A 05/12/2017   Procedure: LEFT HEART CATH AND CORS/GRAFTS ANGIOGRAPHY;  Surgeon: Burnell Blanks, MD;  Location: [facility];  Service: Cardiovascular;  Laterality: N/A;  . LEFT HEART CATHETERIZATION WITH CORONARY/GRAFT ANGIOGRAM  06/24/2011   Procedure: LEFT HEART CATHETERIZATION WITH Beatrix Fetters;  Surgeon: Burnell Blanks, MD;  Location: [facility] CATH LAB;  Service: Cardiovascular;;  . LEFT HEART CATHETERIZATION WITH CORONARY/GRAFT ANGIOGRAM N/A 02/26/2013   Procedure: LEFT HEART CATHETERIZATION WITH Beatrix Fetters;  Surgeon: Blane Ohara, MD;  Location: [facility] CATH LAB;  Service: Cardiovascular;  Laterality: N/A;  . LOWER EXTREMITY ANGIOGRAM N/A 10/30/2011   Procedure: LOWER EXTREMITY ANGIOGRAM;  Surgeon: Sherren Mocha, MD;  Location: [facility] CATH LAB;  Service: Cardiovascular;  Laterality: N/A;  . PERCUTANEOUS CORONARY STENT INTERVENTION (PCI-S)  02/26/2013   Procedure: PERCUTANEOUS CORONARY STENT INTERVENTION (PCI-S);  Surgeon: Blane Ohara, MD;  Location: [facility] CATH LAB;  Service: Cardiovascular;;    Current Outpatient Medications  Medication Sig Dispense Refill  . acetaminophen (TYLENOL) 325 MG tablet Take 650 mg by mouth every 6 (six) hours as needed for moderate pain or headache.    Marland Kitchen aspirin EC 81 MG  tablet Take 1 tablet (81 mg total) by mouth daily. 90 tablet 3  . carvedilol (COREG) 12.5 MG tablet TAKE 1 & 1/2 TABLETS TWICE DAILY WITH MEAL. 270 tablet 2  . clopidogrel (PLAVIX) 75 MG tablet Take 1 tablet (75 mg total) by mouth daily. 90 tablet 2  . furosemide (LASIX) 40 MG tablet Take 1 tablet (40 mg total) by mouth daily. 90 tablet 2  . gabapentin (NEURONTIN) 100 MG capsule Take 100 mg by mouth 3 (three) times daily.     Marland Kitchen LORazepam (ATIVAN) 1 MG tablet Take 1 mg  by mouth 2 (two) times daily as needed for anxiety (sleep).     . losartan (COZAAR) 50 MG tablet TAKE 1 TABLET DAILY. 90 tablet 3  . Multiple Vitamin (MULTIVITAMIN) tablet Take 1 tablet by mouth daily.    . nitroGLYCERIN (NITROSTAT) 0.4 MG SL tablet Place 1 tablet (0.4 mg total) under the tongue every 5 (five) minutes x 3 doses as needed for chest pain (if no relief after 3rd dose, proceed to the ED for an evaluation or call 911). For chest pain 25 tablet 3  . Omega-3 Fatty Acids (FISH OIL) 1000 MG CAPS Take 2,000 mg by mouth daily.    Marland Kitchen OVER THE COUNTER MEDICATION Suppository to help with BM with Occasional fleet enema    . pantoprazole (PROTONIX) 40 MG tablet TAKE 1 TABLET 2 TIMES A Townsend BEFORE A MEAL. 180 tablet 3  . ranolazine (RANEXA) 500 MG 12 hr tablet Take 1 tablet by mouth every 12 hours 180 tablet 3  . rosuvastatin (CRESTOR) 5 MG tablet Take 1 tablet (5 mg total) by mouth daily. 90 tablet 2   No current facility-administered medications for this visit.    Allergies:  Patient has no known allergies.   Social History: The patient  reports that he quit smoking about 25 years ago. His smoking use included cigarettes. He has a 80.00 pack-year smoking history. He has never used smokeless tobacco. He reports that he does not drink alcohol or use drugs.   ROS:  Please see the history of present illness. Otherwise, complete review of systems is positive for hearing loss and chronic pain.  All other systems are reviewed and negative.   Physical Exam: VS:  BP 109/62   Pulse 63   Ht 5\' 5"  (1.651 m)   Wt 163 lb 6.4 oz (74.1 kg)   SpO2 98%   BMI 27.19 kg/m , BMI Body mass index is 27.19 kg/m.  Wt Readings from Last 3 Encounters:  02/08/19 163 lb 6.4 oz (74.1 kg)  07/16/18 166 lb 6.4 oz (75.5 kg)  02/09/18 168 lb 9.6 oz (76.5 kg)    General: Patient appears comfortable at rest. HEENT: Conjunctiva and lids normal, wearing a mask. Neck: Supple, no elevated JVP, soft bilateral carotid  bruits, no thyromegaly. Lungs: Clear to auscultation, nonlabored breathing at rest. Cardiac: Regular rate and rhythm, no S3, soft systolic murmur. Abdomen: Soft, nontender, bowel sounds present. Extremities: No pitting edema, distal pulses 2+. Skin: Warm and dry. Musculoskeletal: No kyphosis. Neuropsychiatric: Alert and oriented x3, affect grossly appropriate.  ECG:  An ECG dated 01/16/2018 was personally reviewed today and demonstrated:  Sinus rhythm with significant lead artifact, fusion beats, PVCs, nonspecific ST-T changes.  Recent Labwork:  No interval lab work for review.  Other Studies Reviewed Today:  Cardiac catheterization 05/12/2017:  Prox RCA lesion is 100% stenosed.  Dist LM to Ost LAD lesion is 100% stenosed.  LIMA graft was  visualized by angiography and is normal in caliber.  Dist Graft to Insertion lesion is 20% stenosed.  Mid LAD lesion is 30% stenosed.  Prox Cx to Mid Cx lesion is 50% stenosed.  Ost 2nd Mrg to 2nd Mrg lesion is 50% stenosed.  Ost 3rd Mrg lesion is 100% stenosed.  Origin lesion is 100% stenosed.  Origin lesion is 100% stenosed.  SVG graft was not visualized.  SVG graft was not visualized.  1. 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 Presumed that the vein grafts supplied the RCA and OM branches and have been chronically occluded.  2. The ostial LAD is chronically occluded. The LIMA graft to the LAD is patent. The stented segment of the graft at the anastomosis to the LAD is patent with minimal restenosis. The mid LAD stent just beyond the anastomosis is patent with minimal restenosis.  3. The Circumflex artery is patent with moderate diffuse disease in the proximal and mid vessel. There are several small caliber OM branches with diffuse severe disease. The third Om branch has moderate non-obstructive disease. The 4th OM branch is now occluded. This is a new finding. This branch fills  from left to left collaterals.  4. The RCA is chronically occluded in the proximal segment. The vein graft to the PDA is occluded. The mid and distal RCA fills briskly from left to right collaterals.  5. Mild elevation of LV filling pressure  Recommendations: Continue medical management of CAD. He has diffuse disease with no focal targets for PCI.  Assessment and Plan:  1.  Multivessel CAD status post CABG with graft disease and subsequent DES intervention as described above.  He is clinically stable at this time on medical therapy.  Would continue dual antiplatelet regimen along with antianginal therapy.  ECG reviewed.  2.  Soft bilateral carotid bruits.  Carotid Dopplers will be obtained.  3.  Ischemic cardiomyopathy with LVEF approximately 45%.  Continue with conservative management.  4.  Mixed hyperlipidemia, continues on Crestor.  He is following with Dr. Pleas Koch.  Medication Adjustments/Labs and Tests Ordered: Current medicines are reviewed at length with the patient today.  Concerns regarding medicines are outlined above.   Tests Ordered: Orders Placed This Encounter  Procedures  . EKG 12-Lead  . VAS US CAROTID    Medication Changes: No orders of the defined types were placed in this encounter.   Disposition:  Follow up 6 months in the [place] office.  Signed, Satira Sark, MD, [facility] 02/08/2019 12:07 PM    [facility] [place], [place], [place] [address] Phone: [phone]; Fax: [phone]

## 2019-02-11 ENCOUNTER — Telehealth: Payer: Self-pay | Admitting: *Deleted

## 2019-02-11 ENCOUNTER — Other Ambulatory Visit: Payer: Self-pay

## 2019-02-11 ENCOUNTER — Other Ambulatory Visit: Payer: Self-pay | Admitting: Cardiology

## 2019-02-11 ENCOUNTER — Ambulatory Visit (INDEPENDENT_AMBULATORY_CARE_PROVIDER_SITE_OTHER): Payer: Medicare Other

## 2019-02-11 DIAGNOSIS — R0989 Other specified symptoms and signs involving the circulatory and respiratory systems: Secondary | ICD-10-CM | POA: Diagnosis not present

## 2019-02-11 DIAGNOSIS — I6523 Occlusion and stenosis of bilateral carotid arteries: Secondary | ICD-10-CM

## 2019-02-11 NOTE — Telephone Encounter (Signed)
LM to return call.

## 2019-02-11 NOTE — Telephone Encounter (Signed)
-----   Message from Satira Sark, MD sent at 02/11/2019 10:41 AM EDT ----- Results reviewed.  ICA disease is moderate, no significant change in follow-up plan or medications.  Can repeat carotid Dopplers in 1 year.

## 2019-02-11 NOTE — Telephone Encounter (Signed)
Pt aware and orders placed for 1 yr repeat - routed to pcp

## 2019-02-24 DIAGNOSIS — E782 Mixed hyperlipidemia: Secondary | ICD-10-CM | POA: Diagnosis not present

## 2019-02-24 DIAGNOSIS — M1990 Unspecified osteoarthritis, unspecified site: Secondary | ICD-10-CM | POA: Diagnosis not present

## 2019-02-24 DIAGNOSIS — F419 Anxiety disorder, unspecified: Secondary | ICD-10-CM | POA: Diagnosis not present

## 2019-02-24 DIAGNOSIS — N183 Chronic kidney disease, stage 3 (moderate): Secondary | ICD-10-CM | POA: Diagnosis not present

## 2019-02-24 DIAGNOSIS — I739 Peripheral vascular disease, unspecified: Secondary | ICD-10-CM | POA: Diagnosis not present

## 2019-02-24 DIAGNOSIS — I10 Essential (primary) hypertension: Secondary | ICD-10-CM | POA: Diagnosis not present

## 2019-02-24 DIAGNOSIS — Z951 Presence of aortocoronary bypass graft: Secondary | ICD-10-CM | POA: Diagnosis not present

## 2019-02-24 DIAGNOSIS — K219 Gastro-esophageal reflux disease without esophagitis: Secondary | ICD-10-CM | POA: Diagnosis not present

## 2019-02-25 DIAGNOSIS — M79672 Pain in left foot: Secondary | ICD-10-CM | POA: Diagnosis not present

## 2019-02-25 DIAGNOSIS — M25579 Pain in unspecified ankle and joints of unspecified foot: Secondary | ICD-10-CM | POA: Diagnosis not present

## 2019-02-25 DIAGNOSIS — M79671 Pain in right foot: Secondary | ICD-10-CM | POA: Diagnosis not present

## 2019-03-03 DIAGNOSIS — I255 Ischemic cardiomyopathy: Secondary | ICD-10-CM | POA: Diagnosis not present

## 2019-03-03 DIAGNOSIS — I6523 Occlusion and stenosis of bilateral carotid arteries: Secondary | ICD-10-CM | POA: Diagnosis not present

## 2019-03-03 DIAGNOSIS — G5601 Carpal tunnel syndrome, right upper limb: Secondary | ICD-10-CM | POA: Diagnosis not present

## 2019-03-03 DIAGNOSIS — M4722 Other spondylosis with radiculopathy, cervical region: Secondary | ICD-10-CM | POA: Diagnosis not present

## 2019-03-03 DIAGNOSIS — Z6828 Body mass index (BMI) 28.0-28.9, adult: Secondary | ICD-10-CM | POA: Diagnosis not present

## 2019-03-03 DIAGNOSIS — Z23 Encounter for immunization: Secondary | ICD-10-CM | POA: Diagnosis not present

## 2019-03-03 DIAGNOSIS — Z955 Presence of coronary angioplasty implant and graft: Secondary | ICD-10-CM | POA: Diagnosis not present

## 2019-03-03 DIAGNOSIS — N189 Chronic kidney disease, unspecified: Secondary | ICD-10-CM | POA: Diagnosis not present

## 2019-03-25 DIAGNOSIS — M79672 Pain in left foot: Secondary | ICD-10-CM | POA: Diagnosis not present

## 2019-03-25 DIAGNOSIS — M79671 Pain in right foot: Secondary | ICD-10-CM | POA: Diagnosis not present

## 2019-03-25 DIAGNOSIS — M25579 Pain in unspecified ankle and joints of unspecified foot: Secondary | ICD-10-CM | POA: Diagnosis not present

## 2019-03-26 DIAGNOSIS — I10 Essential (primary) hypertension: Secondary | ICD-10-CM | POA: Diagnosis not present

## 2019-03-26 DIAGNOSIS — K219 Gastro-esophageal reflux disease without esophagitis: Secondary | ICD-10-CM | POA: Diagnosis not present

## 2019-03-31 DIAGNOSIS — M4722 Other spondylosis with radiculopathy, cervical region: Secondary | ICD-10-CM | POA: Diagnosis not present

## 2019-03-31 DIAGNOSIS — N183 Chronic kidney disease, stage 3 unspecified: Secondary | ICD-10-CM | POA: Diagnosis not present

## 2019-03-31 DIAGNOSIS — D649 Anemia, unspecified: Secondary | ICD-10-CM | POA: Diagnosis not present

## 2019-03-31 DIAGNOSIS — Z6828 Body mass index (BMI) 28.0-28.9, adult: Secondary | ICD-10-CM | POA: Diagnosis not present

## 2019-03-31 DIAGNOSIS — G5601 Carpal tunnel syndrome, right upper limb: Secondary | ICD-10-CM | POA: Diagnosis not present

## 2019-04-29 ENCOUNTER — Other Ambulatory Visit: Payer: Self-pay | Admitting: Gastroenterology

## 2019-04-29 DIAGNOSIS — K219 Gastro-esophageal reflux disease without esophagitis: Secondary | ICD-10-CM

## 2019-04-29 DIAGNOSIS — M25579 Pain in unspecified ankle and joints of unspecified foot: Secondary | ICD-10-CM | POA: Diagnosis not present

## 2019-04-29 DIAGNOSIS — M79671 Pain in right foot: Secondary | ICD-10-CM | POA: Diagnosis not present

## 2019-04-29 DIAGNOSIS — M79672 Pain in left foot: Secondary | ICD-10-CM | POA: Diagnosis not present

## 2019-05-08 ENCOUNTER — Other Ambulatory Visit: Payer: Self-pay | Admitting: Cardiology

## 2019-05-24 ENCOUNTER — Other Ambulatory Visit: Payer: Self-pay | Admitting: Cardiology

## 2019-05-31 DIAGNOSIS — M79671 Pain in right foot: Secondary | ICD-10-CM | POA: Diagnosis not present

## 2019-05-31 DIAGNOSIS — M79672 Pain in left foot: Secondary | ICD-10-CM | POA: Diagnosis not present

## 2019-05-31 DIAGNOSIS — M25579 Pain in unspecified ankle and joints of unspecified foot: Secondary | ICD-10-CM | POA: Diagnosis not present

## 2019-06-25 DIAGNOSIS — I10 Essential (primary) hypertension: Secondary | ICD-10-CM | POA: Diagnosis not present

## 2019-06-25 DIAGNOSIS — K219 Gastro-esophageal reflux disease without esophagitis: Secondary | ICD-10-CM | POA: Diagnosis not present

## 2019-06-28 DIAGNOSIS — E039 Hypothyroidism, unspecified: Secondary | ICD-10-CM | POA: Diagnosis not present

## 2019-06-28 DIAGNOSIS — I10 Essential (primary) hypertension: Secondary | ICD-10-CM | POA: Diagnosis not present

## 2019-06-28 DIAGNOSIS — M79671 Pain in right foot: Secondary | ICD-10-CM | POA: Diagnosis not present

## 2019-06-28 DIAGNOSIS — M79672 Pain in left foot: Secondary | ICD-10-CM | POA: Diagnosis not present

## 2019-06-28 DIAGNOSIS — K219 Gastro-esophageal reflux disease without esophagitis: Secondary | ICD-10-CM | POA: Diagnosis not present

## 2019-06-28 DIAGNOSIS — M25579 Pain in unspecified ankle and joints of unspecified foot: Secondary | ICD-10-CM | POA: Diagnosis not present

## 2019-07-05 DIAGNOSIS — I6523 Occlusion and stenosis of bilateral carotid arteries: Secondary | ICD-10-CM | POA: Diagnosis not present

## 2019-07-05 DIAGNOSIS — Z0001 Encounter for general adult medical examination with abnormal findings: Secondary | ICD-10-CM | POA: Diagnosis not present

## 2019-07-05 DIAGNOSIS — Z951 Presence of aortocoronary bypass graft: Secondary | ICD-10-CM | POA: Diagnosis not present

## 2019-07-05 DIAGNOSIS — M4722 Other spondylosis with radiculopathy, cervical region: Secondary | ICD-10-CM | POA: Diagnosis not present

## 2019-07-05 DIAGNOSIS — R21 Rash and other nonspecific skin eruption: Secondary | ICD-10-CM | POA: Diagnosis not present

## 2019-07-05 DIAGNOSIS — Z955 Presence of coronary angioplasty implant and graft: Secondary | ICD-10-CM | POA: Diagnosis not present

## 2019-07-10 ENCOUNTER — Ambulatory Visit: Payer: Medicare Other | Attending: Internal Medicine

## 2019-07-10 DIAGNOSIS — Z23 Encounter for immunization: Secondary | ICD-10-CM

## 2019-07-10 NOTE — Progress Notes (Signed)
   Covid-19 Vaccination Clinic  Name:  Hunter Townsend    MRN: [id number] DOB: Mar 29, 1941  07/10/2019  [name] was observed post Covid-19 immunization for 15 minutes without incidence. He was provided with Vaccine Information Sheet and instruction to access the V-Safe system.   [name] was instructed to call 911 with any severe reactions post vaccine: Marland Kitchen Difficulty breathing  . Swelling of your face and throat  . A fast heartbeat  . A bad rash all over your body  . Dizziness and weakness    Immunizations Administered    Name Date Dose VIS Date Route   Moderna COVID-19 Vaccine 07/10/2019  1:45 PM 0.5 mL 04/27/2019 Intramuscular   Manufacturer: Moderna   Lot: [id number]   [id number]

## 2019-07-23 DIAGNOSIS — N183 Chronic kidney disease, stage 3 unspecified: Secondary | ICD-10-CM | POA: Diagnosis not present

## 2019-07-23 DIAGNOSIS — I129 Hypertensive chronic kidney disease with stage 1 through stage 4 chronic kidney disease, or unspecified chronic kidney disease: Secondary | ICD-10-CM | POA: Diagnosis not present

## 2019-07-23 DIAGNOSIS — R21 Rash and other nonspecific skin eruption: Secondary | ICD-10-CM | POA: Diagnosis not present

## 2019-07-26 DIAGNOSIS — M25579 Pain in unspecified ankle and joints of unspecified foot: Secondary | ICD-10-CM | POA: Diagnosis not present

## 2019-07-26 DIAGNOSIS — M79672 Pain in left foot: Secondary | ICD-10-CM | POA: Diagnosis not present

## 2019-07-26 DIAGNOSIS — M79671 Pain in right foot: Secondary | ICD-10-CM | POA: Diagnosis not present

## 2019-08-07 ENCOUNTER — Ambulatory Visit: Payer: Self-pay | Attending: Internal Medicine

## 2019-08-07 DIAGNOSIS — Z23 Encounter for immunization: Secondary | ICD-10-CM

## 2019-08-07 NOTE — Progress Notes (Signed)
   Covid-19 Vaccination Clinic  Name:  Hunter Townsend    MRN: [id number] DOB: December 16, 1940  08/07/2019  Mr. [name] was observed post Covid-19 immunization for 15 minutes without incident. He was provided with Vaccine Information Sheet and instruction to access the V-Safe system.   Mr. [name] was instructed to call 911 with any severe reactions post vaccine: Marland Kitchen Difficulty breathing  . Swelling of face and throat  . A fast heartbeat  . A bad rash all over body  . Dizziness and weakness   Immunizations Administered    Name Date Dose VIS Date Route   Pfizer COVID-19 Vaccine 08/07/2019 12:08 PM 0.3 mL 05/07/2019 Intramuscular   Manufacturer: [place]   Lot: [id number]   [place]: [id number]

## 2019-08-24 DIAGNOSIS — N189 Chronic kidney disease, unspecified: Secondary | ICD-10-CM | POA: Diagnosis not present

## 2019-08-24 DIAGNOSIS — I129 Hypertensive chronic kidney disease with stage 1 through stage 4 chronic kidney disease, or unspecified chronic kidney disease: Secondary | ICD-10-CM | POA: Diagnosis not present

## 2019-08-25 ENCOUNTER — Encounter: Payer: Self-pay | Admitting: Cardiology

## 2019-08-25 ENCOUNTER — Encounter: Payer: Self-pay | Admitting: *Deleted

## 2019-08-25 ENCOUNTER — Telehealth: Payer: Self-pay | Admitting: Cardiology

## 2019-08-25 DIAGNOSIS — Z7902 Long term (current) use of antithrombotics/antiplatelets: Secondary | ICD-10-CM | POA: Diagnosis not present

## 2019-08-25 DIAGNOSIS — I959 Hypotension, unspecified: Secondary | ICD-10-CM | POA: Diagnosis not present

## 2019-08-25 DIAGNOSIS — I499 Cardiac arrhythmia, unspecified: Secondary | ICD-10-CM | POA: Diagnosis not present

## 2019-08-25 DIAGNOSIS — Z951 Presence of aortocoronary bypass graft: Secondary | ICD-10-CM | POA: Diagnosis not present

## 2019-08-25 DIAGNOSIS — Z79899 Other long term (current) drug therapy: Secondary | ICD-10-CM | POA: Diagnosis not present

## 2019-08-25 DIAGNOSIS — I4891 Unspecified atrial fibrillation: Secondary | ICD-10-CM | POA: Diagnosis not present

## 2019-08-25 DIAGNOSIS — I10 Essential (primary) hypertension: Secondary | ICD-10-CM | POA: Diagnosis not present

## 2019-08-25 DIAGNOSIS — I447 Left bundle-branch block, unspecified: Secondary | ICD-10-CM | POA: Diagnosis not present

## 2019-08-25 DIAGNOSIS — I119 Hypertensive heart disease without heart failure: Secondary | ICD-10-CM | POA: Diagnosis not present

## 2019-08-25 DIAGNOSIS — Z7982 Long term (current) use of aspirin: Secondary | ICD-10-CM | POA: Diagnosis not present

## 2019-08-25 DIAGNOSIS — Z8249 Family history of ischemic heart disease and other diseases of the circulatory system: Secondary | ICD-10-CM | POA: Diagnosis not present

## 2019-08-25 DIAGNOSIS — Z955 Presence of coronary angioplasty implant and graft: Secondary | ICD-10-CM | POA: Diagnosis not present

## 2019-08-25 NOTE — Telephone Encounter (Signed)
Patient discussed with [facility] staff. Sent from his pcp office for low bp's, on ER visit SBPs actually 100 or higher which is his baseline. From er report EKG does show rate controlled afib which is new for patient, patient asymptomatic. We will arrange f/u with Dr Domenic Polite to reevaluate bp's, and also verify diagnosis of afib and whether or not to change his DAPT to a DOAC. He will be discharged from ER.    Carlyle Dolly MD

## 2019-08-26 ENCOUNTER — Encounter (HOSPITAL_COMMUNITY): Payer: Self-pay | Admitting: Nurse Practitioner

## 2019-08-26 ENCOUNTER — Ambulatory Visit (HOSPITAL_COMMUNITY)
Admission: RE | Admit: 2019-08-26 | Discharge: 2019-08-26 | Disposition: A | Discharge status: Home / Self Care | Payer: Medicare Other | Source: Ambulatory Visit | Attending: Nurse Practitioner | Admitting: Nurse Practitioner

## 2019-08-26 ENCOUNTER — Other Ambulatory Visit: Payer: Self-pay

## 2019-08-26 VITALS — BP 120/64 | HR 78 | Ht 65.0 in | Wt 153.0 lb

## 2019-08-26 DIAGNOSIS — Z951 Presence of aortocoronary bypass graft: Secondary | ICD-10-CM | POA: Insufficient documentation

## 2019-08-26 DIAGNOSIS — I255 Ischemic cardiomyopathy: Secondary | ICD-10-CM | POA: Insufficient documentation

## 2019-08-26 DIAGNOSIS — Z8249 Family history of ischemic heart disease and other diseases of the circulatory system: Secondary | ICD-10-CM | POA: Insufficient documentation

## 2019-08-26 DIAGNOSIS — Z7982 Long term (current) use of aspirin: Secondary | ICD-10-CM | POA: Diagnosis not present

## 2019-08-26 DIAGNOSIS — I509 Heart failure, unspecified: Secondary | ICD-10-CM | POA: Diagnosis not present

## 2019-08-26 DIAGNOSIS — I2510 Atherosclerotic heart disease of native coronary artery without angina pectoris: Secondary | ICD-10-CM | POA: Insufficient documentation

## 2019-08-26 DIAGNOSIS — Z87891 Personal history of nicotine dependence: Secondary | ICD-10-CM | POA: Insufficient documentation

## 2019-08-26 DIAGNOSIS — I252 Old myocardial infarction: Secondary | ICD-10-CM | POA: Diagnosis not present

## 2019-08-26 DIAGNOSIS — Z7901 Long term (current) use of anticoagulants: Secondary | ICD-10-CM | POA: Insufficient documentation

## 2019-08-26 DIAGNOSIS — Z7902 Long term (current) use of antithrombotics/antiplatelets: Secondary | ICD-10-CM | POA: Diagnosis not present

## 2019-08-26 DIAGNOSIS — I110 Hypertensive heart disease with heart failure: Secondary | ICD-10-CM | POA: Diagnosis not present

## 2019-08-26 DIAGNOSIS — Z79899 Other long term (current) drug therapy: Secondary | ICD-10-CM | POA: Diagnosis not present

## 2019-08-26 DIAGNOSIS — I4891 Unspecified atrial fibrillation: Secondary | ICD-10-CM | POA: Diagnosis not present

## 2019-08-26 DIAGNOSIS — I739 Peripheral vascular disease, unspecified: Secondary | ICD-10-CM | POA: Insufficient documentation

## 2019-08-26 MED ORDER — APIXABAN 5 MG PO TABS: 5.0000 mg | ORAL_TABLET | Freq: Two times a day (BID) | ORAL | 3 refills | Status: DC

## 2019-08-26 NOTE — Patient Instructions (Signed)
Do not start Eliquis until we call you with the [facility]

## 2019-08-26 NOTE — Progress Notes (Signed)
Primary Care Physician: Curlene Labrum, MD Referring Physician: [facility]  Cardiologist: Dr. Staci Righter is a 79 y.o. male with a h/o CAD, HTN, that was seen in [facility] and was found to be in new onset afib. OF note, he had his second covid shot 3/13. He was rate controlled and ER did not place on anticoagulation. He is currently on asa/plavix for CAD. CHA2DS2VASc score of at least 5. He feels improved today. No significant alcohol or caffeine or tobacco use. Lives alone but denies snoring history.   Today, he denies symptoms of palpitations, chest pain, shortness of breath, orthopnea, PND, lower extremity edema, dizziness, presyncope, syncope, or neurologic sequela. The patient is tolerating medications without difficulties and is otherwise without complaint today.   Past Medical History:  Diagnosis Date  . Arthritis   . BPH (benign prostatic hyperplasia)   . CHF (congestive heart failure) ([place])   . Coronary atherosclerosis of native coronary artery    Multivessel, NSTEMI  s/p PTCA/DES to mid LAD 06/24/11,, known patent LIMA-LAD and occlusion of all vein grafts  . Hypertension   . Ischemic cardiomyopathy    EF 35-40% by cath 06/24/11 (EF 37% by nuclear stress test 06/2010).  . Myocardial infarction ([place]) 2013  . PAD (peripheral artery disease) (HCC)    Status post left SFA stent - Dr. Burt Knack   Past Surgical History:  Procedure Laterality Date  . BACK SURGERY     "cut me ~ 1/2inch to take sciatic nerve out of my right leg"  . CARDIAC CATHETERIZATION  03/28/2016  . CARDIAC CATHETERIZATION N/A 03/28/2016   Procedure: Left Heart Cath and Cors/Grafts Angiography;  Surgeon: Leonie Man, MD;  Location: [facility];  Service: Cardiovascular;  Laterality: N/A;  . CATARACT EXTRACTION W/ INTRAOCULAR LENS  IMPLANT, BILATERAL    . CORONARY ANGIOPLASTY    . CORONARY ARTERY BYPASS GRAFT  1987   [place], [place] - LIMA to LAD, SVG to RCA presumably  . LEFT HEART  CATH AND CORS/GRAFTS ANGIOGRAPHY N/A 05/12/2017   Procedure: LEFT HEART CATH AND CORS/GRAFTS ANGIOGRAPHY;  Surgeon: Burnell Blanks, MD;  Location: [facility];  Service: Cardiovascular;  Laterality: N/A;  . LEFT HEART CATHETERIZATION WITH CORONARY/GRAFT ANGIOGRAM  06/24/2011   Procedure: LEFT HEART CATHETERIZATION WITH Beatrix Fetters;  Surgeon: Burnell Blanks, MD;  Location: [facility] CATH LAB;  Service: Cardiovascular;;  . LEFT HEART CATHETERIZATION WITH CORONARY/GRAFT ANGIOGRAM N/A 02/26/2013   Procedure: LEFT HEART CATHETERIZATION WITH Beatrix Fetters;  Surgeon: Blane Ohara, MD;  Location: [facility] CATH LAB;  Service: Cardiovascular;  Laterality: N/A;  . LOWER EXTREMITY ANGIOGRAM N/A 10/30/2011   Procedure: LOWER EXTREMITY ANGIOGRAM;  Surgeon: Sherren Mocha, MD;  Location: [facility] CATH LAB;  Service: Cardiovascular;  Laterality: N/A;  . PERCUTANEOUS CORONARY STENT INTERVENTION (PCI-S)  02/26/2013   Procedure: PERCUTANEOUS CORONARY STENT INTERVENTION (PCI-S);  Surgeon: Blane Ohara, MD;  Location: [facility] CATH LAB;  Service: Cardiovascular;;    Current Outpatient Medications  Medication Sig Dispense Refill  . acetaminophen (TYLENOL) 325 MG tablet Take 650 mg by mouth every 6 (six) hours as needed for moderate pain or headache.    Marland Kitchen aspirin EC 81 MG tablet Take 1 tablet (81 mg total) by mouth daily. 90 tablet 3  . carvedilol (COREG) 12.5 MG tablet TAKE 1 & 1/2 TABLETS TWICE DAILY WITH MEAL. 270 tablet 2  . clopidogrel (PLAVIX) 75 MG tablet TAKE 1 TABLET ONCE DAILY. [place]  tablet 1  . furosemide (LASIX) 40 MG tablet TAKE 1 TABLET ONCE DAILY. 90 tablet 1  . gabapentin (NEURONTIN) 100 MG capsule Take 100 mg by mouth 3 (three) times daily.     Marland Kitchen LORazepam (ATIVAN) 1 MG tablet Take 1 mg by mouth 2 (two) times daily as needed for anxiety (sleep).     . losartan (COZAAR) 50 MG tablet TAKE 1 TABLET DAILY. 90 tablet 3  . Multiple Vitamin (MULTIVITAMIN) tablet Take 1 tablet by mouth  daily.    . Omega-3 Fatty Acids (FISH OIL) 1000 MG CAPS Take 2,000 mg by mouth daily.    . pantoprazole (PROTONIX) 40 MG tablet TAKE 1 TABLET TWICE DAILY BEFORE A MEAL. 180 tablet 3  . ranolazine (RANEXA) 500 MG 12 hr tablet Take 1 tablet by mouth every 12 hours 180 tablet 3  . rosuvastatin (CRESTOR) 5 MG tablet Take 1 tablet (5 mg total) by mouth daily. 90 tablet 2  . apixaban (ELIQUIS) 5 MG TABS tablet Take 1 tablet (5 mg total) by mouth 2 (two) times daily. 60 tablet 3  . nitroGLYCERIN (NITROSTAT) 0.4 MG SL tablet Place 1 tablet (0.4 mg total) under the tongue every 5 (five) minutes x 3 doses as needed for chest pain (if no relief after 3rd dose, proceed to the ED for an evaluation or call 911). For chest pain (Patient not taking: Reported on 08/26/2019) 25 tablet 3  . OVER THE COUNTER MEDICATION Suppository to help with BM with Occasional fleet enema     No current facility-administered medications for this encounter.    No Known Allergies  Social History   Socioeconomic History  . Marital status: Widowed    Spouse name: Not on file  . Number of children: Not on file  . Years of education: Not on file  . Highest education level: Not on file  Occupational History  . Occupation: Retired    Comment: Orthoptist  Tobacco Use  . Smoking status: Former Smoker    Packs/day: 2.00    Years: 40.00    Pack years: 80.00    Types: Cigarettes    Quit date: 05/27/1993    Years since quitting: 26.2  . Smokeless tobacco: Never Used  Substance and Sexual Activity  . Alcohol use: No    Alcohol/week: 0.0 standard drinks  . Drug use: No  . Sexual activity: Not Currently  Other Topics Concern  . Not on file  Social History Narrative  . Not on file   Social Determinants of Health   Financial Resource Strain:   . Difficulty of Paying Living Expenses:   Food Insecurity:   . Worried About Charity fundraiser in the Last Year:   . Arboriculturist in the Last Year:   Transportation Needs:   .  Film/video editor (Medical):   Marland Kitchen Lack of Transportation (Non-Medical):   Physical Activity:   . Days of Exercise per Week:   . Minutes of Exercise per Session:   Stress:   . Feeling of Stress :   Social Connections:   . Frequency of Communication with Friends and Family:   . Frequency of Social Gatherings with Friends and Family:   . Attends Religious Services:   . Active Member of Clubs or Organizations:   . Attends Archivist Meetings:   Marland Kitchen Marital Status:   Intimate Partner Violence:   . Fear of Current or Ex-Partner:   . Emotionally Abused:   Marland Kitchen Physically Abused:   .  Sexually Abused:     Family History  Problem Relation Age of Onset  . CAD Other        pt. says he is unsure of the relationship  . Hypertension Other   . Colon cancer Neg Hx   . Gastric cancer Neg Hx   . Esophageal cancer Neg Hx     ROS- All systems are reviewed and negative except as per the HPI above  Physical Exam: Vitals:   08/26/19 1502  BP: 120/64  Pulse: 78  Weight: 69.4 kg  Height: 5\' 5"  (1.651 m)   Wt Readings from Last 3 Encounters:  08/26/19 69.4 kg  02/08/19 74.1 kg  07/16/18 75.5 kg    Labs: Lab Results  Component Value Date   NA 139 05/12/2017   K 4.6 05/12/2017   CL 106 05/12/2017   CO2 27 05/12/2017   GLUCOSE 106 (H) 05/12/2017   BUN 13 05/12/2017   CREATININE 1.27 (H) 05/12/2017   CALCIUM 9.4 05/12/2017   Lab Results  Component Value Date   INR 1.33 05/12/2017   Lab Results  Component Value Date   CHOL 152 06/25/2011   HDL 28 (L) 06/25/2011   LDLCALC 94 06/25/2011   TRIG 150 (H) 06/25/2011     GEN- The patient is well appearing, alert and oriented x 3 today.   Head- normocephalic, atraumatic Eyes-  Sclera clear, conjunctiva pink Ears- hearing intact Oropharynx- clear Neck- supple, no JVP Lymph- no cervical lymphadenopathy Lungs- Clear to ausculation bilaterally, normal work of breathing Heart- irregular rate and rhythm, no murmurs, rubs or  gallops, PMI not laterally displaced GI- soft, NT, ND, + BS Extremities- no clubbing, cyanosis, or edema MS- no significant deformity or atrophy Skin- no rash or lesion Psych- euthymic mood, full affect Neuro- strength and sensation are intact  EKG-afib at 78 bpm, qrs int 108 ms, qtc 462 ms Epic records reviewed and records were forwarded form [facility]    Assessment and Plan: 1. Afib  New onset General education re afib Possible trigger was covid shot as I have seen several pts that the shot appears to have  triggered afib.  He is rate controlled today and will continue carvedilol 12.5 mg 1 1/2 tabs bid   2. CHA2DS2VASc score of 5 I sent [name] an Epic message as pt will need a DOAC and is on plavix and asa for his CAD, which will increase his bleeding risk. [name] replied that the pt can stop plavix now and later may able to stop asa as well. I will start eliquis 2.5  mg bid, (over age 41 with creatinine of 1.84 at [facility] ER, weight 153 lbs) Bleeding precautions discussed   I will see back in 2 weeks  Butch Penny C. Madysin Crisp, [facility] [address] [place], [place] [address] [phone]

## 2019-08-27 ENCOUNTER — Other Ambulatory Visit (HOSPITAL_COMMUNITY): Payer: Self-pay | Admitting: *Deleted

## 2019-08-27 ENCOUNTER — Encounter (HOSPITAL_COMMUNITY): Payer: Self-pay | Admitting: Nurse Practitioner

## 2019-08-27 MED ORDER — APIXABAN 2.5 MG PO TABS: 2.5000 mg | ORAL_TABLET | Freq: Two times a day (BID) | ORAL | 3 refills | Status: DC

## 2019-08-30 DIAGNOSIS — M79671 Pain in right foot: Secondary | ICD-10-CM | POA: Diagnosis not present

## 2019-08-30 DIAGNOSIS — M25579 Pain in unspecified ankle and joints of unspecified foot: Secondary | ICD-10-CM | POA: Diagnosis not present

## 2019-08-30 DIAGNOSIS — M79672 Pain in left foot: Secondary | ICD-10-CM | POA: Diagnosis not present

## 2019-08-30 NOTE — Progress Notes (Deleted)
Cardiology Office Note  Date: 08/30/2019   ID: [name], DOB 19-Aug-1940, MRN [id number]  PCP:  Curlene Labrum, MD  Cardiologist:  Rozann Lesches, MD Electrophysiologist:  None   Chief Complaint: F/U HTN, CAD, New onset atrial fibrillation  History of Present Illness: Hunter Townsend is a 79 y.o. male with a history of CAD, HTN, new onset atrial fibrillation.    He was seen by his PCP for low blood pressure and sent to emergency room at [facility] with diagnosis of new onset atrial fibrillation in March.  His rate was controlled.  He was not started on anticoagulants at that time.  He was on dual antiplatelet therapy at that visit.  Case was discussed with Dr. Harl Bowie who suggested follow-up with [name] to reevaluate blood pressures and verify diagnosis of atrial fibrillation and if the patient needed to change from [facility] to [place].     Past Medical History:  Diagnosis Date  . Arthritis   . BPH (benign prostatic hyperplasia)   . CHF (congestive heart failure) ([place])   . Coronary atherosclerosis of native coronary artery    Multivessel, NSTEMI  s/p PTCA/DES to mid LAD 06/24/11,, known patent LIMA-LAD and occlusion of all vein grafts  . Hypertension   . Ischemic cardiomyopathy    EF 35-40% by cath 06/24/11 (EF 37% by nuclear stress test 06/2010).  . Myocardial infarction ([place]) 2013  . PAD (peripheral artery disease) (HCC)    Status post left SFA stent - Dr. Burt Knack    Past Surgical History:  Procedure Laterality Date  . BACK SURGERY     "cut me ~ 1/2inch to take sciatic nerve out of my right leg"  . CARDIAC CATHETERIZATION  03/28/2016  . CARDIAC CATHETERIZATION N/A 03/28/2016   Procedure: Left Heart Cath and Cors/Grafts Angiography;  Surgeon: Leonie Man, MD;  Location: [facility];  Service: Cardiovascular;  Laterality: N/A;  . CATARACT EXTRACTION W/ INTRAOCULAR LENS  IMPLANT, BILATERAL    . CORONARY ANGIOPLASTY    . CORONARY  ARTERY BYPASS GRAFT  1987   [place], [place] - LIMA to LAD, SVG to RCA presumably  . LEFT HEART CATH AND CORS/GRAFTS ANGIOGRAPHY N/A 05/12/2017   Procedure: LEFT HEART CATH AND CORS/GRAFTS ANGIOGRAPHY;  Surgeon: Burnell Blanks, MD;  Location: [facility];  Service: Cardiovascular;  Laterality: N/A;  . LEFT HEART CATHETERIZATION WITH CORONARY/GRAFT ANGIOGRAM  06/24/2011   Procedure: LEFT HEART CATHETERIZATION WITH Beatrix Fetters;  Surgeon: Burnell Blanks, MD;  Location: [facility] CATH LAB;  Service: Cardiovascular;;  . LEFT HEART CATHETERIZATION WITH CORONARY/GRAFT ANGIOGRAM N/A 02/26/2013   Procedure: LEFT HEART CATHETERIZATION WITH Beatrix Fetters;  Surgeon: Blane Ohara, MD;  Location: [facility] CATH LAB;  Service: Cardiovascular;  Laterality: N/A;  . LOWER EXTREMITY ANGIOGRAM N/A 10/30/2011   Procedure: LOWER EXTREMITY ANGIOGRAM;  Surgeon: Sherren Mocha, MD;  Location: [facility] CATH LAB;  Service: Cardiovascular;  Laterality: N/A;  . PERCUTANEOUS CORONARY STENT INTERVENTION (PCI-S)  02/26/2013   Procedure: PERCUTANEOUS CORONARY STENT INTERVENTION (PCI-S);  Surgeon: Blane Ohara, MD;  Location: [facility] CATH LAB;  Service: Cardiovascular;;    Current Outpatient Medications  Medication Sig Dispense Refill  . acetaminophen (TYLENOL) 325 MG tablet Take 650 mg by mouth every 6 (six) hours as needed for moderate pain or headache.    Marland Kitchen apixaban (ELIQUIS) 2.5 MG TABS tablet Take 1 tablet (2.5 mg total) by mouth 2 (two) times daily. 60 tablet 3  .  aspirin EC 81 MG tablet Take 1 tablet (81 mg total) by mouth daily. 90 tablet 3  . carvedilol (COREG) 12.5 MG tablet TAKE 1 & 1/2 TABLETS TWICE DAILY WITH MEAL. 270 tablet 2  . furosemide (LASIX) 40 MG tablet TAKE 1 TABLET ONCE DAILY. 90 tablet 1  . gabapentin (NEURONTIN) 100 MG capsule Take 100 mg by mouth 3 (three) times daily.     Marland Kitchen LORazepam (ATIVAN) 1 MG tablet Take 1 mg by mouth 2 (two) times daily as needed for anxiety (sleep).       . losartan (COZAAR) 50 MG tablet TAKE 1 TABLET DAILY. 90 tablet 3  . Multiple Vitamin (MULTIVITAMIN) tablet Take 1 tablet by mouth daily.    . nitroGLYCERIN (NITROSTAT) 0.4 MG SL tablet Place 1 tablet (0.4 mg total) under the tongue every 5 (five) minutes x 3 doses as needed for chest pain (if no relief after 3rd dose, proceed to the ED for an evaluation or call 911). For chest pain (Patient not taking: Reported on 08/26/2019) 25 tablet 3  . Omega-3 Fatty Acids (FISH OIL) 1000 MG CAPS Take 2,000 mg by mouth daily.    Marland Kitchen OVER THE COUNTER MEDICATION Suppository to help with BM with Occasional fleet enema    . pantoprazole (PROTONIX) 40 MG tablet TAKE 1 TABLET TWICE DAILY BEFORE A MEAL. 180 tablet 3  . ranolazine (RANEXA) 500 MG 12 hr tablet Take 1 tablet by mouth every 12 hours 180 tablet 3  . rosuvastatin (CRESTOR) 5 MG tablet Take 1 tablet (5 mg total) by mouth daily. 90 tablet 2   No current facility-administered medications for this visit.   Allergies:  Patient has no known allergies.   Social History: The patient  reports that he quit smoking about 26 years ago. His smoking use included cigarettes. He has a 80.00 pack-year smoking history. He has never used smokeless tobacco. He reports that he does not drink alcohol or use drugs.   Family History: The patient's family history includes CAD in an other family member; Hypertension in an other family member.   ROS:  Please see the history of present illness. Otherwise, complete review of systems is positive for {NONE DEFAULTED:18576::"none"}.  All other systems are reviewed and negative.   Physical Exam: VS:  There were no vitals taken for this visit., BMI There is no height or weight on file to calculate BMI.  Wt Readings from Last 3 Encounters:  08/26/19 153 lb (69.4 kg)  02/08/19 163 lb 6.4 oz (74.1 kg)  07/16/18 166 lb 6.4 oz (75.5 kg)    General: Patient appears comfortable at rest. HEENT: Conjunctiva and lids normal, oropharynx clear  with moist mucosa. Neck: Supple, no elevated JVP or carotid bruits, no thyromegaly. Lungs: Clear to auscultation, nonlabored breathing at rest. Cardiac: Regular rate and rhythm, no S3 or significant systolic murmur, no pericardial rub. Abdomen: Soft, nontender, no hepatomegaly, bowel sounds present, no guarding or rebound. Extremities: No pitting edema, distal pulses 2+. Skin: Warm and dry. Musculoskeletal: No kyphosis. Neuropsychiatric: Alert and oriented x3, affect grossly appropriate.  ECG:  {EKG/Telemetry Strips Reviewed:[id number]}  Recent Labwork: No results found for requested labs within last 8760 hours.     Component Value Date/Time   CHOL 152 06/25/2011 0400   TRIG 150 (H) 06/25/2011 0400   HDL 28 (L) 06/25/2011 0400   CHOLHDL 5.4 06/25/2011 0400   VLDL 30 06/25/2011 0400   LDLCALC 94 06/25/2011 0400    Other Studies Reviewed Today:  Assessment and Plan:  1. CAD in native artery   2. New onset a-fib ([place])   3. Essential hypertension    1. CAD in native artery ***  2. New onset a-fib (HCC) ***  3. Essential hypertension ***  Medication Adjustments/Labs and Tests Ordered: Current medicines are reviewed at length with the patient today.  Concerns regarding medicines are outlined above.   Disposition: Follow-up with Domenic Polite or APP  Signed, Levell July, NP 08/30/2019 11:40 PM    [facility] [place], [place], [place] [address] Phone: [phone]; Fax: [phone]

## 2019-08-31 ENCOUNTER — Ambulatory Visit: Payer: Medicare Other | Admitting: Family Medicine

## 2019-09-01 ENCOUNTER — Telehealth: Payer: Self-pay | Admitting: *Deleted

## 2019-09-01 NOTE — Telephone Encounter (Signed)
Reports getting back injections by Dr. Nelva Bush every 3 months. In the past he was able to hold plavix 3 days prior. Requesting if he can hold eliquis for 3 days before back injections. Advised to have office doing the injections send request to our office. Verbalized understanding.

## 2019-09-09 ENCOUNTER — Ambulatory Visit (HOSPITAL_COMMUNITY)
Admission: RE | Admit: 2019-09-09 | Discharge: 2019-09-09 | Disposition: A | Discharge status: Home / Self Care | Payer: Medicare Other | Source: Ambulatory Visit | Attending: Nurse Practitioner | Admitting: Nurse Practitioner

## 2019-09-09 ENCOUNTER — Encounter (HOSPITAL_COMMUNITY): Payer: Self-pay | Admitting: Nurse Practitioner

## 2019-09-09 ENCOUNTER — Other Ambulatory Visit: Payer: Self-pay

## 2019-09-09 VITALS — BP 116/56 | HR 74 | Ht 65.0 in | Wt 164.4 lb

## 2019-09-09 DIAGNOSIS — I252 Old myocardial infarction: Secondary | ICD-10-CM | POA: Diagnosis not present

## 2019-09-09 DIAGNOSIS — Z87891 Personal history of nicotine dependence: Secondary | ICD-10-CM | POA: Diagnosis not present

## 2019-09-09 DIAGNOSIS — Z7982 Long term (current) use of aspirin: Secondary | ICD-10-CM | POA: Diagnosis not present

## 2019-09-09 DIAGNOSIS — I509 Heart failure, unspecified: Secondary | ICD-10-CM | POA: Insufficient documentation

## 2019-09-09 DIAGNOSIS — Z20822 Contact with and (suspected) exposure to covid-19: Secondary | ICD-10-CM | POA: Insufficient documentation

## 2019-09-09 DIAGNOSIS — I4891 Unspecified atrial fibrillation: Secondary | ICD-10-CM | POA: Diagnosis not present

## 2019-09-09 DIAGNOSIS — I110 Hypertensive heart disease with heart failure: Secondary | ICD-10-CM | POA: Diagnosis not present

## 2019-09-09 DIAGNOSIS — Z01818 Encounter for other preprocedural examination: Secondary | ICD-10-CM | POA: Insufficient documentation

## 2019-09-09 DIAGNOSIS — Z7901 Long term (current) use of anticoagulants: Secondary | ICD-10-CM | POA: Diagnosis not present

## 2019-09-09 DIAGNOSIS — D6869 Other thrombophilia: Secondary | ICD-10-CM

## 2019-09-09 DIAGNOSIS — Z79899 Other long term (current) drug therapy: Secondary | ICD-10-CM | POA: Insufficient documentation

## 2019-09-09 DIAGNOSIS — I2510 Atherosclerotic heart disease of native coronary artery without angina pectoris: Secondary | ICD-10-CM | POA: Insufficient documentation

## 2019-09-09 LAB — CBC
HCT: 32.7 % — ABNORMAL LOW (ref 39.0–52.0)
Hemoglobin: 10.4 g/dL — ABNORMAL LOW (ref 13.0–17.0)
MCH: 31.2 pg (ref 26.0–34.0)
MCHC: 31.8 g/dL (ref 30.0–36.0)
MCV: 98.2 fL (ref 80.0–100.0)
Platelets: 204 10*3/uL (ref 150–400)
RBC: 3.33 MIL/uL — ABNORMAL LOW (ref 4.22–5.81)
RDW: 13.7 % (ref 11.5–15.5)
WBC: 5.3 10*3/uL (ref 4.0–10.5)
nRBC: 0 % (ref 0.0–0.2)

## 2019-09-09 LAB — BASIC METABOLIC PANEL
Anion gap: 8 (ref 5–15)
BUN: 21 mg/dL (ref 8–23)
CO2: 25 mmol/L (ref 22–32)
Calcium: 9.1 mg/dL (ref 8.9–10.3)
Chloride: 107 mmol/L (ref 98–111)
Creatinine, Ser: 1.68 mg/dL — ABNORMAL HIGH (ref 0.61–1.24)
GFR calc Af Amer: 43 mL/min — ABNORMAL LOW (ref >60)
GFR calc non Af Amer: 38 mL/min — ABNORMAL LOW (ref >60)
Glucose, Bld: 118 mg/dL — ABNORMAL HIGH (ref 70–99)
Potassium: 4.4 mmol/L (ref 3.5–5.1)
Sodium: 140 mmol/L (ref 135–145)

## 2019-09-09 NOTE — H&P (View-Only) (Signed)
Primary Care Physician: Curlene Labrum, MD Referring Physician: [facility]  Cardiologist: Dr. Staci Righter is a 79 y.o. male with a h/o CAD, HTN, that was seen in [facility] ER and was found to be in new onset afib. OF note, he had his second covid shot 3/13. He was rate controlled and ER did not place on anticoagulation. He is currently on asa/plavix for CAD. CHA2DS2VASc score of at least 5. He feels improved today. No significant alcohol or caffeine or tobacco use. Lives alone but denies snoring history.   F/u in afib clinic, 09/09/19. He has now been on anticoagulation x 2 weeks wihout any missed doses.Hunter Townsend He remains in afib rate controlled at 74 bpm,  He would like to proceed with cardioversion after 3 weeks of uninterrupted cardioversion's. That date will be after 4/23.   Today, he denies symptoms of palpitations, chest pain, shortness of breath, orthopnea, PND, lower extremity edema, dizziness, presyncope, syncope, or neurologic sequela. The patient is tolerating medications without difficulties and is otherwise without complaint today.   Past Medical History:  Diagnosis Date  . Arthritis   . BPH (benign prostatic hyperplasia)   . CHF (congestive heart failure) ([place])   . Coronary atherosclerosis of native coronary artery    Multivessel, NSTEMI  s/p PTCA/DES to mid LAD 06/24/11,, known patent LIMA-LAD and occlusion of all vein grafts  . Hypertension   . Ischemic cardiomyopathy    EF 35-40% by cath 06/24/11 (EF 37% by nuclear stress test 06/2010).  . Myocardial infarction ([place]) 2013  . PAD (peripheral artery disease) (HCC)    Status post left SFA stent - Dr. Burt Knack   Past Surgical History:  Procedure Laterality Date  . BACK SURGERY     "cut me ~ 1/2inch to take sciatic nerve out of my right leg"  . CARDIAC CATHETERIZATION  03/28/2016  . CARDIAC CATHETERIZATION N/A 03/28/2016   Procedure: Left Heart Cath and Cors/Grafts Angiography;  Surgeon: Leonie Man, MD;   Location: [facility];  Service: Cardiovascular;  Laterality: N/A;  . CATARACT EXTRACTION W/ INTRAOCULAR LENS  IMPLANT, BILATERAL    . CORONARY ANGIOPLASTY    . CORONARY ARTERY BYPASS GRAFT  1987   [place], [place] - LIMA to LAD, SVG to RCA presumably  . LEFT HEART CATH AND CORS/GRAFTS ANGIOGRAPHY N/A 05/12/2017   Procedure: LEFT HEART CATH AND CORS/GRAFTS ANGIOGRAPHY;  Surgeon: Burnell Blanks, MD;  Location: [facility];  Service: Cardiovascular;  Laterality: N/A;  . LEFT HEART CATHETERIZATION WITH CORONARY/GRAFT ANGIOGRAM  06/24/2011   Procedure: LEFT HEART CATHETERIZATION WITH Beatrix Fetters;  Surgeon: Burnell Blanks, MD;  Location: [facility] CATH LAB;  Service: Cardiovascular;;  . LEFT HEART CATHETERIZATION WITH CORONARY/GRAFT ANGIOGRAM N/A 02/26/2013   Procedure: LEFT HEART CATHETERIZATION WITH Beatrix Fetters;  Surgeon: Blane Ohara, MD;  Location: [facility] CATH LAB;  Service: Cardiovascular;  Laterality: N/A;  . LOWER EXTREMITY ANGIOGRAM N/A 10/30/2011   Procedure: LOWER EXTREMITY ANGIOGRAM;  Surgeon: Sherren Mocha, MD;  Location: [facility] CATH LAB;  Service: Cardiovascular;  Laterality: N/A;  . PERCUTANEOUS CORONARY STENT INTERVENTION (PCI-S)  02/26/2013   Procedure: PERCUTANEOUS CORONARY STENT INTERVENTION (PCI-S);  Surgeon: Blane Ohara, MD;  Location: [facility] CATH LAB;  Service: Cardiovascular;;    Current Outpatient Medications  Medication Sig Dispense Refill  . acetaminophen (TYLENOL) 325 MG tablet Take 650 mg by mouth every 12 (twelve) hours as needed for moderate pain or headache.     Hunter Townsend  apixaban (ELIQUIS) 2.5 MG TABS tablet Take 1 tablet (2.5 mg total) by mouth 2 (two) times daily. 60 tablet 3  . aspirin EC 81 MG tablet Take 1 tablet (81 mg total) by mouth daily. 90 tablet 3  . carvedilol (COREG) 12.5 MG tablet TAKE 1 & 1/2 TABLETS TWICE DAILY WITH MEAL. 270 tablet 2  . furosemide (LASIX) 40 MG tablet TAKE 1 TABLET ONCE DAILY. 90 tablet 1  . gabapentin  (NEURONTIN) 100 MG capsule Take 100 mg by mouth 3 (three) times daily.     . isosorbide mononitrate (IMDUR) 30 MG 24 hr tablet Take 30 mg by mouth daily.     Hunter Townsend LORazepam (ATIVAN) 1 MG tablet Take 1 mg by mouth 2 (two) times daily as needed for anxiety (sleep).     . losartan (COZAAR) 50 MG tablet TAKE 1 TABLET DAILY. 90 tablet 3  . Multiple Vitamin (MULTIVITAMIN) tablet Take 1 tablet by mouth daily.    . nitroGLYCERIN (NITROSTAT) 0.4 MG SL tablet Place 1 tablet (0.4 mg total) under the tongue every 5 (five) minutes x 3 doses as needed for chest pain (if no relief after 3rd dose, proceed to the ED for an evaluation or call 911). For chest pain 25 tablet 3  . Omega-3 Fatty Acids (FISH OIL) 1000 MG CAPS Take 2,000 mg by mouth daily.    Hunter Townsend OVER THE COUNTER MEDICATION Suppository to help with BM with Occasional fleet enema    . pantoprazole (PROTONIX) 40 MG tablet TAKE 1 TABLET TWICE DAILY BEFORE A MEAL. (Patient taking differently: TAKE 1 TABLET BY MOUTH AS NEEDED BEFORE A MEAL.) 180 tablet 3  . potassium chloride (KLOR-CON) 10 MEQ tablet Take 10 mEq by mouth daily.     . ranolazine (RANEXA) 500 MG 12 hr tablet Take 1 tablet by mouth every 12 hours 180 tablet 3  . rosuvastatin (CRESTOR) 5 MG tablet Take 1 tablet (5 mg total) by mouth daily. 90 tablet 2  . triamcinolone cream (KENALOG) 0.1 % Apply 1 application topically as needed.      No current facility-administered medications for this encounter.    No Known Allergies  Social History   Socioeconomic History  . Marital status: Widowed    Spouse name: Not on file  . Number of children: Not on file  . Years of education: Not on file  . Highest education level: Not on file  Occupational History  . Occupation: Retired    Comment: Orthoptist  Tobacco Use  . Smoking status: Former Smoker    Packs/day: 2.00    Years: 40.00    Pack years: 80.00    Types: Cigarettes    Quit date: 05/27/1993    Years since quitting: 26.3  . Smokeless tobacco:  Never Used  Substance and Sexual Activity  . Alcohol use: No    Alcohol/week: 0.0 standard drinks  . Drug use: No  . Sexual activity: Not Currently  Other Topics Concern  . Not on file  Social History Narrative  . Not on file   Social Determinants of Health   Financial Resource Strain:   . Difficulty of Paying Living Expenses:   Food Insecurity:   . Worried About Charity fundraiser in the Last Year:   . Arboriculturist in the Last Year:   Transportation Needs:   . Film/video editor (Medical):   Hunter Townsend Lack of Transportation (Non-Medical):   Physical Activity:   . Days of Exercise per Week:   .  Minutes of Exercise per Session:   Stress:   . Feeling of Stress :   Social Connections:   . Frequency of Communication with Friends and Family:   . Frequency of Social Gatherings with Friends and Family:   . Attends Religious Services:   . Active Member of Clubs or Organizations:   . Attends Archivist Meetings:   Hunter Townsend Marital Status:   Intimate Partner Violence:   . Fear of Current or Ex-Partner:   . Emotionally Abused:   Hunter Townsend Physically Abused:   . Sexually Abused:     Family History  Problem Relation Age of Onset  . CAD Other        pt. says he is unsure of the relationship  . Hypertension Other   . Colon cancer Neg Hx   . Gastric cancer Neg Hx   . Esophageal cancer Neg Hx     ROS- All systems are reviewed and negative except as per the HPI above  Physical Exam: Vitals:   09/09/19 1338  BP: (!) 116/56  Pulse: 74  Weight: 74.6 kg  Height: 5\' 5"  (1.651 m)   Wt Readings from Last 3 Encounters:  09/09/19 74.6 kg  08/26/19 69.4 kg  02/08/19 74.1 kg    Labs: Lab Results  Component Value Date   NA 139 05/12/2017   K 4.6 05/12/2017   CL 106 05/12/2017   CO2 27 05/12/2017   GLUCOSE 106 (H) 05/12/2017   BUN 13 05/12/2017   CREATININE 1.27 (H) 05/12/2017   CALCIUM 9.4 05/12/2017   Lab Results  Component Value Date   INR 1.33 05/12/2017   Lab  Results  Component Value Date   CHOL 152 06/25/2011   HDL 28 (L) 06/25/2011   LDLCALC 94 06/25/2011   TRIG 150 (H) 06/25/2011     GEN- The patient is well appearing, alert and oriented x 3 today.   Head- normocephalic, atraumatic Eyes-  Sclera clear, conjunctiva pink Ears- hearing intact Oropharynx- clear Neck- supple, no JVP Lymph- no cervical lymphadenopathy Lungs- Clear to ausculation bilaterally, normal work of breathing Heart- irregular rate and rhythm, no murmurs, rubs or gallops, PMI not laterally displaced GI- soft, NT, ND, + BS Extremities- no clubbing, cyanosis, or edema MS- no significant deformity or atrophy Skin- no rash or lesion Psych- euthymic mood, full affect Neuro- strength and sensation are intact  EKG-afib at 78 bpm, qrs int 108 ms, qtc 462 ms Epic records reviewed and records were forwarded form [facility] ER    Assessment and Plan: 1. Afib  New onset General education re afib Possible trigger was covid shot as I have seen several pts that the shot appears to have  triggered afib.  He is rate controlled today and will continue carvedilol 12.5 mg 1 1/2 tabs bid  Will plan on cardioversion the last week of April, stressed not to miss any doses of eliquis, risks vrs benefit explained    2. CHA2DS2VASc score of 5 Pt is now off  plavix and continues on ASA only for his CAD Continues on eliquis 2.5 mg bid   Pt would prefer to have one week f/u with [name] in [place]  for post cardioversion f/u as it is not convenient  for him to travel to [place] so often   Butch Penny C. Ona Rathert, [facility] [address] [place],  [address] [phone]

## 2019-09-09 NOTE — Patient Instructions (Addendum)
Cardioversion scheduled for Tuesday, April 27th  - Arrive at the [facility] and go to admitting at [facility] not eat or drink anything after midnight the night prior to your procedure.  - Take all your morning medication with a sip of water prior to arrival.  - You will not be able to drive home after your procedure.   Scheduling will call you regarding follow up with [name] office in 2 weeks after cardioversion.

## 2019-09-09 NOTE — Progress Notes (Signed)
Primary Care Physician: Curlene Labrum, MD Referring Physician: [facility]  Cardiologist: Dr. Staci Righter is a 79 y.o. male with a h/o CAD, HTN, that was seen in [facility] ER and was found to be in new onset afib. OF note, he had his second covid shot 3/13. He was rate controlled and ER did not place on anticoagulation. He is currently on asa/plavix for CAD. CHA2DS2VASc score of at least 5. He feels improved today. No significant alcohol or caffeine or tobacco use. Lives alone but denies snoring history.   F/u in afib clinic, 09/09/19. He has now been on anticoagulation x 2 weeks wihout any missed doses.Marland Kitchen He remains in afib rate controlled at 74 bpm,  He would like to proceed with cardioversion after 3 weeks of uninterrupted cardioversion's. That date will be after 4/23.   Today, he denies symptoms of palpitations, chest pain, shortness of breath, orthopnea, PND, lower extremity edema, dizziness, presyncope, syncope, or neurologic sequela. The patient is tolerating medications without difficulties and is otherwise without complaint today.   Past Medical History:  Diagnosis Date  . Arthritis   . BPH (benign prostatic hyperplasia)   . CHF (congestive heart failure) ([place])   . Coronary atherosclerosis of native coronary artery    Multivessel, NSTEMI  s/p PTCA/DES to mid LAD 06/24/11,, known patent LIMA-LAD and occlusion of all vein grafts  . Hypertension   . Ischemic cardiomyopathy    EF 35-40% by cath 06/24/11 (EF 37% by nuclear stress test 06/2010).  . Myocardial infarction ([place]) 2013  . PAD (peripheral artery disease) ([facility])    Status post left SFA stent - Dr. Burt Knack   Past Surgical History:  Procedure Laterality Date  . BACK SURGERY     "cut me ~ 1/2inch to take sciatic nerve out of my right leg"  . CARDIAC CATHETERIZATION  03/28/2016  . CARDIAC CATHETERIZATION N/A 03/28/2016   Procedure: Left Heart Cath and Cors/Grafts Angiography;  Surgeon: Leonie Man, MD;   Location: [facility];  Service: Cardiovascular;  Laterality: N/A;  . CATARACT EXTRACTION W/ INTRAOCULAR LENS  IMPLANT, BILATERAL    . CORONARY ANGIOPLASTY    . CORONARY ARTERY BYPASS GRAFT  1987   [place], [place] - LIMA to LAD, SVG to RCA presumably  . LEFT HEART CATH AND CORS/GRAFTS ANGIOGRAPHY N/A 05/12/2017   Procedure: LEFT HEART CATH AND CORS/GRAFTS ANGIOGRAPHY;  Surgeon: Burnell Blanks, MD;  Location: [place] [facility];  Service: Cardiovascular;  Laterality: N/A;  . LEFT HEART CATHETERIZATION WITH CORONARY/GRAFT ANGIOGRAM  06/24/2011   Procedure: LEFT HEART CATHETERIZATION WITH Beatrix Fetters;  Surgeon: Burnell Blanks, MD;  Location: [facility] CATH LAB;  Service: Cardiovascular;;  . LEFT HEART CATHETERIZATION WITH CORONARY/GRAFT ANGIOGRAM N/A 02/26/2013   Procedure: LEFT HEART CATHETERIZATION WITH Beatrix Fetters;  Surgeon: Blane Ohara, MD;  Location: [facility] CATH LAB;  Service: Cardiovascular;  Laterality: N/A;  . LOWER EXTREMITY ANGIOGRAM N/A 10/30/2011   Procedure: LOWER EXTREMITY ANGIOGRAM;  Surgeon: Sherren Mocha, MD;  Location: [facility] CATH LAB;  Service: Cardiovascular;  Laterality: N/A;  . PERCUTANEOUS CORONARY STENT INTERVENTION (PCI-S)  02/26/2013   Procedure: PERCUTANEOUS CORONARY STENT INTERVENTION (PCI-S);  Surgeon: Blane Ohara, MD;  Location: [facility] CATH LAB;  Service: Cardiovascular;;    Current Outpatient Medications  Medication Sig Dispense Refill  . acetaminophen (TYLENOL) 325 MG tablet Take 650 mg by mouth every 12 (twelve) hours as needed for moderate pain or headache.     Marland Kitchen  apixaban (ELIQUIS) 2.5 MG TABS tablet Take 1 tablet (2.5 mg total) by mouth 2 (two) times daily. 60 tablet 3  . aspirin EC 81 MG tablet Take 1 tablet (81 mg total) by mouth daily. 90 tablet 3  . carvedilol (COREG) 12.5 MG tablet TAKE 1 & 1/2 TABLETS TWICE DAILY WITH MEAL. 270 tablet 2  . furosemide (LASIX) 40 MG tablet TAKE 1 TABLET ONCE DAILY. 90 tablet 1  . gabapentin  (NEURONTIN) 100 MG capsule Take 100 mg by mouth 3 (three) times daily.     . isosorbide mononitrate (IMDUR) 30 MG 24 hr tablet Take 30 mg by mouth daily.     Marland Kitchen LORazepam (ATIVAN) 1 MG tablet Take 1 mg by mouth 2 (two) times daily as needed for anxiety (sleep).     . losartan (COZAAR) 50 MG tablet TAKE 1 TABLET DAILY. 90 tablet 3  . Multiple Vitamin (MULTIVITAMIN) tablet Take 1 tablet by mouth daily.    . nitroGLYCERIN (NITROSTAT) 0.4 MG SL tablet Place 1 tablet (0.4 mg total) under the tongue every 5 (five) minutes x 3 doses as needed for chest pain (if no relief after 3rd dose, proceed to the ED for an evaluation or call 911). For chest pain 25 tablet 3  . Omega-3 Fatty Acids (FISH OIL) 1000 MG CAPS Take 2,000 mg by mouth daily.    Marland Kitchen OVER THE COUNTER MEDICATION Suppository to help with BM with Occasional fleet enema    . pantoprazole (PROTONIX) 40 MG tablet TAKE 1 TABLET TWICE DAILY BEFORE A MEAL. (Patient taking differently: TAKE 1 TABLET BY MOUTH AS NEEDED BEFORE A MEAL.) 180 tablet 3  . potassium chloride (KLOR-CON) 10 MEQ tablet Take 10 mEq by mouth daily.     . ranolazine (RANEXA) 500 MG 12 hr tablet Take 1 tablet by mouth every 12 hours 180 tablet 3  . rosuvastatin (CRESTOR) 5 MG tablet Take 1 tablet (5 mg total) by mouth daily. 90 tablet 2  . triamcinolone cream (KENALOG) 0.1 % Apply 1 application topically as needed.      No current facility-administered medications for this encounter.    No Known Allergies  Social History   Socioeconomic History  . Marital status: Widowed    Spouse name: Not on file  . Number of children: Not on file  . Years of education: Not on file  . Highest education level: Not on file  Occupational History  . Occupation: Retired    Comment: Orthoptist  Tobacco Use  . Smoking status: Former Smoker    Packs/day: 2.00    Years: 40.00    Pack years: 80.00    Types: Cigarettes    Quit date: 05/27/1993    Years since quitting: 26.3  . Smokeless tobacco:  Never Used  Substance and Sexual Activity  . Alcohol use: No    Alcohol/week: 0.0 standard drinks  . Drug use: No  . Sexual activity: Not Currently  Other Topics Concern  . Not on file  Social History Narrative  . Not on file   Social Determinants of Health   Financial Resource Strain:   . Difficulty of Paying Living Expenses:   Food Insecurity:   . Worried About Charity fundraiser in the Last Year:   . Arboriculturist in the Last Year:   Transportation Needs:   . Film/video editor (Medical):   Marland Kitchen Lack of Transportation (Non-Medical):   Physical Activity:   . Days of Exercise per Week:   .  Minutes of Exercise per Session:   Stress:   . Feeling of Stress :   Social Connections:   . Frequency of Communication with Friends and Family:   . Frequency of Social Gatherings with Friends and Family:   . Attends Religious Services:   . Active Member of Clubs or Organizations:   . Attends Archivist Meetings:   Marland Kitchen Marital Status:   Intimate Partner Violence:   . Fear of Current or Ex-Partner:   . Emotionally Abused:   Marland Kitchen Physically Abused:   . Sexually Abused:     Family History  Problem Relation Age of Onset  . CAD Other        pt. says he is unsure of the relationship  . Hypertension Other   . Colon cancer Neg Hx   . Gastric cancer Neg Hx   . Esophageal cancer Neg Hx     ROS- All systems are reviewed and negative except as per the HPI above  Physical Exam: Vitals:   09/09/19 1338  BP: (!) 116/56  Pulse: 74  Weight: 74.6 kg  Height: 5\' 5"  (1.651 m)   Wt Readings from Last 3 Encounters:  09/09/19 74.6 kg  08/26/19 69.4 kg  02/08/19 74.1 kg    Labs: Lab Results  Component Value Date   NA 139 05/12/2017   K 4.6 05/12/2017   CL 106 05/12/2017   CO2 27 05/12/2017   GLUCOSE 106 (H) 05/12/2017   BUN 13 05/12/2017   CREATININE 1.27 (H) 05/12/2017   CALCIUM 9.4 05/12/2017   Lab Results  Component Value Date   INR 1.33 05/12/2017   Lab  Results  Component Value Date   CHOL 152 06/25/2011   HDL 28 (L) 06/25/2011   LDLCALC 94 06/25/2011   TRIG 150 (H) 06/25/2011     GEN- The patient is well appearing, alert and oriented x 3 today.   Head- normocephalic, atraumatic Eyes-  Sclera clear, conjunctiva pink Ears- hearing intact Oropharynx- clear Neck- supple, no JVP Lymph- no cervical lymphadenopathy Lungs- Clear to ausculation bilaterally, normal work of breathing Heart- irregular rate and rhythm, no murmurs, rubs or gallops, PMI not laterally displaced GI- soft, NT, ND, + BS Extremities- no clubbing, cyanosis, or edema MS- no significant deformity or atrophy Skin- no rash or lesion Psych- euthymic mood, full affect Neuro- strength and sensation are intact  EKG-afib at 78 bpm, qrs int 108 ms, qtc 462 ms Epic records reviewed and records were forwarded form [facility] ER    Assessment and Plan: 1. Afib  New onset General education re afib Possible trigger was covid shot as I have seen several pts that the shot appears to have  triggered afib.  He is rate controlled today and will continue carvedilol 12.5 mg 1 1/2 tabs bid  Will plan on cardioversion the last week of April, stressed not to miss any doses of eliquis, risks vrs benefit explained    2. CHA2DS2VASc score of 5 Pt is now off  plavix and continues on ASA only for his CAD Continues on eliquis 2.5 mg bid   Pt would prefer to have one week f/u with [name] in [place]  for post cardioversion f/u as it is not convenient  for him to travel to [place] so often   Butch Penny C. Manjinder Breau, [facility] [address] [place], [place] [address] [phone]

## 2019-09-17 ENCOUNTER — Other Ambulatory Visit: Payer: Self-pay | Admitting: Cardiology

## 2019-09-17 ENCOUNTER — Other Ambulatory Visit (HOSPITAL_COMMUNITY): Admission: RE | Admit: 2019-09-17 | Payer: Medicare Other | Source: Ambulatory Visit

## 2019-09-17 ENCOUNTER — Other Ambulatory Visit (HOSPITAL_COMMUNITY)
Admission: RE | Admit: 2019-09-17 | Discharge: 2019-09-17 | Disposition: A | Discharge status: Home / Self Care | Payer: Medicare Other | Source: Ambulatory Visit | Attending: Cardiovascular Disease | Admitting: Cardiovascular Disease

## 2019-09-17 ENCOUNTER — Other Ambulatory Visit: Payer: Self-pay

## 2019-09-17 DIAGNOSIS — Z01812 Encounter for preprocedural laboratory examination: Secondary | ICD-10-CM | POA: Insufficient documentation

## 2019-09-17 DIAGNOSIS — Z20822 Contact with and (suspected) exposure to covid-19: Secondary | ICD-10-CM | POA: Diagnosis not present

## 2019-09-18 LAB — SARS CORONAVIRUS 2 (TAT 6-24 HRS): SARS Coronavirus 2: NEGATIVE

## 2019-09-21 ENCOUNTER — Encounter (HOSPITAL_COMMUNITY)
Admission: RE | Disposition: A | Discharge status: Home / Self Care | Payer: Self-pay | Source: Home / Self Care | Attending: Cardiovascular Disease

## 2019-09-21 ENCOUNTER — Ambulatory Visit (HOSPITAL_COMMUNITY)
Admission: RE | Admit: 2019-09-21 | Discharge: 2019-09-21 | Disposition: A | Discharge status: Home / Self Care | Payer: Medicare Other | Attending: Cardiovascular Disease | Admitting: Cardiovascular Disease

## 2019-09-21 ENCOUNTER — Ambulatory Visit (HOSPITAL_COMMUNITY): Payer: Medicare Other | Admitting: Certified Registered Nurse Anesthetist

## 2019-09-21 ENCOUNTER — Other Ambulatory Visit: Payer: Self-pay

## 2019-09-21 ENCOUNTER — Encounter (HOSPITAL_COMMUNITY): Payer: Self-pay | Admitting: Cardiovascular Disease

## 2019-09-21 DIAGNOSIS — Z7982 Long term (current) use of aspirin: Secondary | ICD-10-CM | POA: Diagnosis not present

## 2019-09-21 DIAGNOSIS — I110 Hypertensive heart disease with heart failure: Secondary | ICD-10-CM | POA: Diagnosis not present

## 2019-09-21 DIAGNOSIS — I255 Ischemic cardiomyopathy: Secondary | ICD-10-CM | POA: Diagnosis not present

## 2019-09-21 DIAGNOSIS — I2510 Atherosclerotic heart disease of native coronary artery without angina pectoris: Secondary | ICD-10-CM | POA: Diagnosis not present

## 2019-09-21 DIAGNOSIS — Z951 Presence of aortocoronary bypass graft: Secondary | ICD-10-CM | POA: Diagnosis not present

## 2019-09-21 DIAGNOSIS — I4891 Unspecified atrial fibrillation: Secondary | ICD-10-CM | POA: Diagnosis not present

## 2019-09-21 DIAGNOSIS — M1990 Unspecified osteoarthritis, unspecified site: Secondary | ICD-10-CM | POA: Diagnosis not present

## 2019-09-21 DIAGNOSIS — N400 Enlarged prostate without lower urinary tract symptoms: Secondary | ICD-10-CM | POA: Diagnosis not present

## 2019-09-21 DIAGNOSIS — I4819 Other persistent atrial fibrillation: Secondary | ICD-10-CM | POA: Diagnosis not present

## 2019-09-21 DIAGNOSIS — Z7901 Long term (current) use of anticoagulants: Secondary | ICD-10-CM | POA: Diagnosis not present

## 2019-09-21 DIAGNOSIS — Z79899 Other long term (current) drug therapy: Secondary | ICD-10-CM | POA: Insufficient documentation

## 2019-09-21 DIAGNOSIS — I509 Heart failure, unspecified: Secondary | ICD-10-CM | POA: Insufficient documentation

## 2019-09-21 DIAGNOSIS — Z8249 Family history of ischemic heart disease and other diseases of the circulatory system: Secondary | ICD-10-CM | POA: Diagnosis not present

## 2019-09-21 DIAGNOSIS — Z955 Presence of coronary angioplasty implant and graft: Secondary | ICD-10-CM | POA: Insufficient documentation

## 2019-09-21 DIAGNOSIS — Z87891 Personal history of nicotine dependence: Secondary | ICD-10-CM | POA: Insufficient documentation

## 2019-09-21 DIAGNOSIS — I25110 Atherosclerotic heart disease of native coronary artery with unstable angina pectoris: Secondary | ICD-10-CM | POA: Diagnosis not present

## 2019-09-21 DIAGNOSIS — I252 Old myocardial infarction: Secondary | ICD-10-CM | POA: Diagnosis not present

## 2019-09-21 DIAGNOSIS — I739 Peripheral vascular disease, unspecified: Secondary | ICD-10-CM | POA: Insufficient documentation

## 2019-09-21 DIAGNOSIS — I5020 Unspecified systolic (congestive) heart failure: Secondary | ICD-10-CM | POA: Diagnosis not present

## 2019-09-21 HISTORY — PX: CARDIOVERSION: SHX1299

## 2019-09-21 LAB — POCT I-STAT, CHEM 8
BUN: 32 mg/dL — ABNORMAL HIGH (ref 8–23)
Calcium, Ion: 1.16 mmol/L (ref 1.15–1.40)
Chloride: 107 mmol/L (ref 98–111)
Creatinine, Ser: 1.9 mg/dL — ABNORMAL HIGH (ref 0.61–1.24)
Glucose, Bld: 112 mg/dL — ABNORMAL HIGH (ref 70–99)
HCT: 32 % — ABNORMAL LOW (ref 39.0–52.0)
Hemoglobin: 10.9 g/dL — ABNORMAL LOW (ref 13.0–17.0)
Potassium: 5.5 mmol/L — ABNORMAL HIGH (ref 3.5–5.1)
Sodium: 141 mmol/L (ref 135–145)
TCO2: 26 mmol/L (ref 22–32)

## 2019-09-21 SURGERY — CARDIOVERSION
Anesthesia: General

## 2019-09-21 MED ORDER — SODIUM CHLORIDE 0.9 % IV SOLN
INTRAVENOUS | Status: DC | PRN
Start: 2019-09-21 — End: 2019-09-21

## 2019-09-21 MED ORDER — LIDOCAINE 2% (20 MG/ML) 5 ML SYRINGE
INTRAMUSCULAR | Status: DC | PRN
Start: 2019-09-21 — End: 2019-09-21
  Administered 2019-09-21: 40 mg via INTRAVENOUS
  Administered 2019-09-21: 20 mg via INTRAVENOUS

## 2019-09-21 MED ORDER — PROPOFOL 10 MG/ML IV BOLUS
INTRAVENOUS | Status: DC | PRN
  Administered 2019-09-21: 50 mg via INTRAVENOUS

## 2019-09-21 NOTE — Anesthesia Postprocedure Evaluation (Signed)
Anesthesia Post Note  Patient: Hunter Townsend  Procedure(s) Performed: CARDIOVERSION (N/A )     Patient location during evaluation: Endoscopy Anesthesia Type: General Level of consciousness: awake Pain management: pain level controlled Vital Signs Assessment: post-procedure vital signs reviewed and stable Respiratory status: spontaneous breathing Cardiovascular status: stable Postop Assessment: no apparent nausea or vomiting Anesthetic complications: no    Last Vitals:  Vitals:   09/21/19 1044 09/21/19 1047  BP: (!) 112/55 (!) 121/59  Pulse: 82 73  Resp: 18 17  Temp:    SpO2: 100% 98%    Last Pain:  Vitals:   09/21/19 1047  TempSrc:   PainSc: 0-No pain                 Artis Beggs

## 2019-09-21 NOTE — Anesthesia Preprocedure Evaluation (Addendum)
Anesthesia Evaluation  Patient identified by MRN, date of birth, ID band Patient awake    Reviewed: Allergy & Precautions, NPO status , Patient's Chart, lab work & pertinent test results  Airway Mallampati: II  TM Distance: >3 FB     Dental  (+) Lower Dentures, Upper Dentures, Dental Advisory Given   Pulmonary former smoker,    breath sounds clear to auscultation       Cardiovascular hypertension, + angina + CAD, + Past MI, + Peripheral Vascular Disease and +CHF   Rhythm:Regular Rate:Normal     Neuro/Psych    GI/Hepatic Neg liver ROS, GERD  ,  Endo/Other  negative endocrine ROS  Renal/GU negative Renal ROS     Musculoskeletal   Abdominal   Peds  Hematology   Anesthesia Other Findings   Reproductive/Obstetrics                            Anesthesia Physical Anesthesia Plan  ASA: III  Anesthesia Plan: General   Post-op Pain Management:    Induction: Intravenous  PONV Risk Score and Plan: 2  Airway Management Planned: Nasal Cannula and Simple Face Mask  Additional Equipment:   Intra-op Plan:   Post-operative Plan:   Informed Consent: I have reviewed the patients History and Physical, chart, labs and discussed the procedure including the risks, benefits and alternatives for the proposed anesthesia with the patient or authorized representative who has indicated his/her understanding and acceptance.     Dental advisory given  Plan Discussed with: CRNA and Anesthesiologist  Anesthesia Plan Comments:         Anesthesia Quick Evaluation

## 2019-09-21 NOTE — Interval H&P Note (Signed)
History and Physical Interval Note:  09/21/2019 10:30 AM  Hunter Townsend  has presented today for surgery, with the diagnosis of AFIB.  The various methods of treatment have been discussed with the patient and family. After consideration of risks, benefits and other options for treatment, the patient has consented to  Procedure(s): CARDIOVERSION (N/A) as a surgical intervention.  The patient's history has been reviewed, patient examined, no change in status, stable for surgery.  I have reviewed the patient's chart and labs.  Questions were answered to the patient's satisfaction.     Skeet Latch, MD

## 2019-09-21 NOTE — Discharge Instructions (Signed)
Chemical Cardioversion  Electrical Cardioversion Electrical cardioversion is the delivery of a jolt of electricity to restore a normal rhythm to the heart. A rhythm that is too fast or is not regular keeps the heart from pumping well. In this procedure, sticky patches or metal paddles are placed on the chest to deliver electricity to the heart from a device. This procedure may be done in an emergency if:  There is low or no blood pressure as a result of the heart rhythm.  Normal rhythm must be restored as fast as possible to protect the brain and heart from further damage.  It may save a life. This may also be a scheduled procedure for irregular or fast heart rhythms that are not immediately life-threatening. Tell a health care provider about:  Any allergies you have.  All medicines you are taking, including vitamins, herbs, eye drops, creams, and over-the-counter medicines.  Any problems you or family members have had with anesthetic medicines.  Any blood disorders you have.  Any surgeries you have had.  Any medical conditions you have.  Whether you are pregnant or may be pregnant. What are the risks? Generally, this is a safe procedure. However, problems may occur, including:  Allergic reactions to medicines.  A blood clot that breaks free and travels to other parts of your body.  The possible return of an abnormal heart rhythm within hours or days after the procedure.  Your heart stopping (cardiac arrest). This is rare. What happens before the procedure? Medicines  Your health care provider may have you start taking: ? Blood-thinning medicines (anticoagulants) so your blood does not clot as easily. ? Medicines to help stabilize your heart rate and rhythm.  Ask your health care provider about: ? Changing or stopping your regular medicines. This is especially important if you are taking diabetes medicines or blood thinners. ? Taking medicines such as aspirin and  ibuprofen. These medicines can thin your blood. Do not take these medicines unless your health care provider tells you to take them. ? Taking over-the-counter medicines, vitamins, herbs, and supplements. General instructions  Follow instructions from your health care provider about eating or drinking restrictions.  Plan to have someone take you home from the hospital or clinic.  If you will be going home right after the procedure, plan to have someone with you for 24 hours.  Ask your health care provider what steps will be taken to help prevent infection. These may include washing your skin with a germ-killing soap. What happens during the procedure?   An IV will be inserted into one of your veins.  Sticky patches (electrodes) or metal paddles may be placed on your chest.  You will be given a medicine to help you relax (sedative).  An electrical shock will be delivered. The procedure may vary among health care providers and hospitals. What can I expect after the procedure?  Your blood pressure, heart rate, breathing rate, and blood oxygen level will be monitored until you leave the hospital or clinic.  Your heart rhythm will be watched to make sure it does not change.  You may have some redness on the skin where the shocks were given. Follow these instructions at home:  Do not drive for 24 hours if you were given a sedative during your procedure.  Take over-the-counter and prescription medicines only as told by your health care provider.  Ask your health care provider how to check your pulse. Check it often.  Rest for 48 hours after  the procedure or as told by your health care provider.  Avoid or limit your caffeine use as told by your health care provider.  Keep all follow-up visits as told by your health care provider. This is important. Contact a health care provider if:  You feel like your heart is beating too quickly or your pulse is not regular.  You have a serious  muscle cramp that does not go away. Get help right away if:  You have discomfort in your chest.  You are dizzy or you feel faint.  You have trouble breathing or you are short of breath.  Your speech is slurred.  You have trouble moving an arm or leg on one side of your body.  Your fingers or toes turn cold or blue. Summary  Electrical cardioversion is the delivery of a jolt of electricity to restore a normal rhythm to the heart.  This procedure may be done right away in an emergency or may be a scheduled procedure if the condition is not an emergency.  Generally, this is a safe procedure.  After the procedure, check your pulse often as told by your health care provider. This information is not intended to replace advice given to you by your health care provider. Make sure you discuss any questions you have with your health care provider. Document Revised: 12/14/2018 Document Reviewed: 12/14/2018 Elsevier Patient Education  [employer].

## 2019-09-21 NOTE — Anesthesia Procedure Notes (Signed)
Procedure Name: General with mask airway Date/Time: 09/21/2019 10:18 AM Performed by: Leonor Liv, CRNA Pre-anesthesia Checklist: Patient identified, Emergency Drugs available, Suction available, Patient being monitored and Timeout performed Patient Re-evaluated:Patient Re-evaluated prior to induction Oxygen Delivery Method: Ambu bag Preoxygenation: Pre-oxygenation with 100% oxygen Induction Type: IV induction Dental Injury: Teeth and Oropharynx as per pre-operative assessment

## 2019-09-21 NOTE — Transfer of Care (Signed)
Immediate Anesthesia Transfer of Care Note  Patient: Jamal Collin Moffa  Procedure(s) Performed: CARDIOVERSION (N/A )  Patient Location: Endoscopy Unit  Anesthesia Type:General  Level of Consciousness: drowsy  Airway & Oxygen Therapy: Patient Spontanous Breathing and Patient connected to nasal cannula oxygen  Post-op Assessment: Report given to RN and Post -op Vital signs reviewed and stable  Post vital signs: Reviewed and stable  Last Vitals:  Vitals Value Taken Time  BP    Temp    Pulse    Resp    SpO2      Last Pain:  Vitals:   09/21/19 0952  TempSrc: Temporal  PainSc: 0-No pain         Complications: No apparent anesthesia complications

## 2019-09-21 NOTE — CV Procedure (Signed)
Electrical Cardioversion Procedure Note Hunter Townsend [id number] [birth year]  Procedure: Electrical Cardioversion Indications:  Atrial Fibrillation  Procedure Details Consent: Risks of procedure as well as the alternatives and risks of each were explained to the (patient/caregiver).  Consent for procedure obtained. Time Out: Verified patient identification, verified procedure, site/side was marked, verified correct patient position, special equipment/implants available, medications/allergies/relevent history reviewed, required imaging and test results available.  Performed  Patient placed on cardiac monitor, pulse oximetry, supplemental oxygen as necessary.  Sedation given: propofol Pacer pads placed anterior and posterior chest.  Cardioverted 1 time(s).  Cardioverted at 150J.  Evaluation Findings: Post procedure EKG shows: sinus with frequent PACs Complications: None Patient did tolerate procedure well.   Skeet Latch, MD 09/21/2019, 10:34 AM

## 2019-09-24 DIAGNOSIS — E039 Hypothyroidism, unspecified: Secondary | ICD-10-CM | POA: Diagnosis not present

## 2019-09-24 DIAGNOSIS — I10 Essential (primary) hypertension: Secondary | ICD-10-CM | POA: Diagnosis not present

## 2019-09-27 DIAGNOSIS — M25579 Pain in unspecified ankle and joints of unspecified foot: Secondary | ICD-10-CM | POA: Diagnosis not present

## 2019-09-27 DIAGNOSIS — M79671 Pain in right foot: Secondary | ICD-10-CM | POA: Diagnosis not present

## 2019-09-27 DIAGNOSIS — M79672 Pain in left foot: Secondary | ICD-10-CM | POA: Diagnosis not present

## 2019-10-06 NOTE — Progress Notes (Addendum)
Cardiology Office Note  Date: 10/07/2019   ID: Hunter Townsend, DOB [birth year], MRN [id number]  PCP:  Curlene Labrum, MD  Cardiologist:  Rozann Lesches, MD Electrophysiologist:  None   Chief Complaint: Follow-up CAD, bilateral carotid bruit, ischemic cardiomyopathy, mixed hyperlipidemia.  History of Present Illness: Hunter Townsend is a 79 y.o. male with a history of CAD, bilateral carotid bruit, ischemic cardiomyopathy, mixed hyperlipidemia.  Last encounter was [name] on 02/08/2019 for routine office visit.  At that time he reported no active angina and clinically stable is current medical therapy.  He was continuing DAPT therapy.  Carotid Dopplers were ordered for bilateral carotid bruits.  His most recent EF was 45% and undergoing conservative management.  Activity limited by chronic in back and neck pain.  Still attempting to go walking most days.  Was having pain injections.  He was doing well on Ranexa in terms of his anginal control.  Recent presentation to [facility] emergency room was found to be in new onset atrial fibrillation.  He had recently received his Covid vaccination on 08/07/2019.  He was rate controlled in ER and not placed on anticoagulation.  He was seen at [facility] atrial fibrillation clinic on 08/26/2019 by Roderic Palau, NP.  His rate was controlled at atrial fibrillation clinic he was continue his carvedilol 1/2 tablets of 12.5 mg twice daily.  [name] sent epic message to [name].  Plavix was stopped.  Eliquis was started 2.5 mg p.o. twice daily (age greater than 3, creatinine 1.84, weight 153.  [name] subsequently followed up on 09/09/2019.  Patient had been anticoagulated x2 weeks without any missed doses.  He wanted to proceed with cardioversion after 3 weeks of uninterrupted anticoagulation. His CHA2DS2-VASc was 5.  He underwent DC cardioversion 09/21/2019 Dr. Oval Linsey.  Cardioverted 1 time at 150 J.  Post procedure EKG showed sinus frequent  PACs.  Patient tolerated procedure well.   Patient states he was doing well up until 4 days ago when he became suddenly more tired and short of breath.  He states he can barely do anything.  Has significant activity intolerance.  Nursing staff states they believe he is back in atrial fibrillation.  He denies any other symptoms such as CVA or TIA-like symptoms.  He does admit to some dizziness but no near syncopal or syncopal episodes.   Past Medical History:  Diagnosis Date  . Arthritis   . BPH (benign prostatic hyperplasia)   . CHF (congestive heart failure) ([place])   . Coronary atherosclerosis of native coronary artery    Multivessel, NSTEMI  s/p PTCA/DES to mid LAD 06/24/11,, known patent LIMA-LAD and occlusion of all vein grafts  . Hypertension   . Ischemic cardiomyopathy    EF 35-40% by cath 06/24/11 (EF 37% by nuclear stress test 06/2010).  . Myocardial infarction ([place]) 2013  . PAD (peripheral artery disease) ([facility])    Status post left SFA stent - Dr. Burt Knack    Past Surgical History:  Procedure Laterality Date  . BACK SURGERY     "cut me ~ 1/2inch to take sciatic nerve out of my right leg"  . CARDIAC CATHETERIZATION  03/28/2016  . CARDIAC CATHETERIZATION N/A 03/28/2016   Procedure: Left Heart Cath and Cors/Grafts Angiography;  Surgeon: Leonie Man, MD;  Location: [place] CV LAB;  Service: Cardiovascular;  Laterality: N/A;  . CARDIOVERSION N/A 09/21/2019   Procedure: CARDIOVERSION;  Surgeon: Skeet Latch, MD;  Location: [place];  Service: Cardiovascular;  Laterality: N/A;  . CATARACT EXTRACTION W/ INTRAOCULAR LENS  IMPLANT, BILATERAL    . CORONARY ANGIOPLASTY    . CORONARY ARTERY BYPASS GRAFT  1987   [place], [place] - LIMA to LAD, SVG to RCA presumably  . LEFT HEART CATH AND CORS/GRAFTS ANGIOGRAPHY N/A 05/12/2017   Procedure: LEFT HEART CATH AND CORS/GRAFTS ANGIOGRAPHY;  Surgeon: Burnell Blanks, MD;  Location: [place] CV LAB;  Service: Cardiovascular;   Laterality: N/A;  . LEFT HEART CATHETERIZATION WITH CORONARY/GRAFT ANGIOGRAM  06/24/2011   Procedure: LEFT HEART CATHETERIZATION WITH Beatrix Fetters;  Surgeon: Burnell Blanks, MD;  Location: [facility] CATH LAB;  Service: Cardiovascular;;  . LEFT HEART CATHETERIZATION WITH CORONARY/GRAFT ANGIOGRAM N/A 02/26/2013   Procedure: LEFT HEART CATHETERIZATION WITH Beatrix Fetters;  Surgeon: Blane Ohara, MD;  Location: [facility] CATH LAB;  Service: Cardiovascular;  Laterality: N/A;  . LOWER EXTREMITY ANGIOGRAM N/A 10/30/2011   Procedure: LOWER EXTREMITY ANGIOGRAM;  Surgeon: Sherren Mocha, MD;  Location: [facility] CATH LAB;  Service: Cardiovascular;  Laterality: N/A;  . PERCUTANEOUS CORONARY STENT INTERVENTION (PCI-S)  02/26/2013   Procedure: PERCUTANEOUS CORONARY STENT INTERVENTION (PCI-S);  Surgeon: Blane Ohara, MD;  Location: [facility] CATH LAB;  Service: Cardiovascular;;    Current Outpatient Medications  Medication Sig Dispense Refill  . acetaminophen (TYLENOL) 325 MG tablet Take 650 mg by mouth every 12 (twelve) hours as needed for moderate pain or headache.     Marland Kitchen apixaban (ELIQUIS) 2.5 MG TABS tablet Take 1 tablet (2.5 mg total) by mouth 2 (two) times daily. 60 tablet 3  . aspirin EC 81 MG tablet Take 1 tablet (81 mg total) by mouth daily. 90 tablet 3  . carvedilol (COREG) 25 MG tablet Take 1 tablet (25 mg total) by mouth 2 (two) times daily. 60 tablet 6  . furosemide (LASIX) 40 MG tablet TAKE 1 TABLET ONCE DAILY. (Patient taking differently: Take 40 mg by mouth daily. ) 90 tablet 1  . gabapentin (NEURONTIN) 100 MG capsule Take 100 mg by mouth 3 (three) times daily.     Marland Kitchen LORazepam (ATIVAN) 1 MG tablet Take 1 mg by mouth 2 (two) times daily.     Marland Kitchen losartan (COZAAR) 50 MG tablet TAKE 1 TABLET DAILY. (Patient taking differently: Take 50 mg by mouth daily. ) 90 tablet 3  . Multiple Vitamin (MULTIVITAMIN) tablet Take 1 tablet by mouth daily.    . nitroGLYCERIN (NITROSTAT) 0.4 MG SL tablet Place 1  tablet (0.4 mg total) under the tongue every 5 (five) minutes x 3 doses as needed for chest pain (if no relief after 3rd dose, proceed to the ED for an evaluation or call 911). For chest pain 25 tablet 3  . Omega-3 Fatty Acids (FISH OIL) 1000 MG CAPS Take 2,000 mg by mouth daily.    Marland Kitchen OVER THE COUNTER MEDICATION Place 1 application rectally as needed (constipation). Suppository to help with BM with Occasional fleet enema     . pantoprazole (PROTONIX) 40 MG tablet TAKE 1 TABLET TWICE DAILY BEFORE A MEAL. (Patient taking differently: Take 40 mg by mouth daily. ) 180 tablet 3  . potassium chloride (KLOR-CON) 10 MEQ tablet Take 10 mEq by mouth daily.     . ranolazine (RANEXA) 500 MG 12 hr tablet TAKE 1 TABLET EVERY 12 HOURS. 60 tablet 0  . rosuvastatin (CRESTOR) 10 MG tablet Take 1 tablet (10 mg total) by mouth daily. 30 tablet 6  . triamcinolone cream (KENALOG) 0.1 % Apply 1 application topically daily as needed (  rash).      No current facility-administered medications for this visit.   Allergies:  Patient has no known allergies.   Social History: The patient  reports that he quit smoking about 26 years ago. His smoking use included cigarettes. He has a 80.00 pack-year smoking history. He has never used smokeless tobacco. He reports that he does not drink alcohol or use drugs.   Family History: The patient's family history includes CAD in an other family member; Hypertension in an other family member.   ROS:  Please see the history of present illness. Otherwise, complete review of systems is positive for none.  All other systems are reviewed and negative.   Physical Exam: VS:  BP 132/70   Pulse 97   Ht 5\' 4"  (1.626 m)   Wt 165 lb 12.8 oz (75.2 kg)   SpO2 98%   BMI 28.46 kg/m , BMI Body mass index is 28.46 kg/m.  Wt Readings from Last 3 Encounters:  10/07/19 165 lb 12.8 oz (75.2 kg)  09/21/19 158 lb (71.7 kg)  09/09/19 164 lb 6.4 oz (74.6 kg)    General: Patient appears comfortable at  rest. Neck: Supple, no elevated JVP or carotid bruits, no thyromegaly. Lungs: Clear to auscultation, nonlabored breathing at rest. Cardiac: Irregularly irregular rate and rhythm, no S3 or significant systolic murmur, no pericardial rub. Extremities: No pitting edema, distal pulses 2+. Skin: Warm and dry. Musculoskeletal: No kyphosis. Neuropsychiatric: Alert and oriented x3, affect grossly appropriate.  ECG:  An ECG dated 10/07/2019 was personally reviewed today and demonstrated:  Atrial fibrillation with a rate of 91, rightward axis, incomplete left bundle branch block  Recent Labwork: 09/09/2019: Platelets 204 09/21/2019: BUN 32; Creatinine, Ser 1.90; Hemoglobin 10.9; Potassium 5.5; Sodium 141     Component Value Date/Time   CHOL 152 06/25/2011 0400   TRIG 150 (H) 06/25/2011 0400   HDL 28 (L) 06/25/2011 0400   CHOLHDL 5.4 06/25/2011 0400   VLDL 30 06/25/2011 0400   LDLCALC 94 06/25/2011 0400    Other Studies Reviewed Today:  Cardiac catheterization 05/12/2017   Prox RCA lesion is 100% stenosed.  Dist LM to Ost LAD lesion is 100% stenosed.  LIMA graft was visualized by angiography and is normal in caliber.  Dist Graft to Insertion lesion is 20% stenosed.  Mid LAD lesion is 30% stenosed.  Prox Cx to Mid Cx lesion is 50% stenosed.  Ost 2nd Mrg to 2nd Mrg lesion is 50% stenosed.  Ost 3rd Mrg lesion is 100% stenosed.  Origin lesion is 100% stenosed.  Origin lesion is 100% stenosed.  SVG graft was not visualized.  SVG graft was not visualized.   1. 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 Presumed that the vein grafts supplied the RCA and OM branches and have been chronically occluded.  2. The ostial LAD is chronically occluded. The LIMA graft to the LAD is patent. The stented segment of the graft at the anastomosis to the LAD is patent with minimal restenosis. The mid LAD stent just beyond the anastomosis is patent  with minimal restenosis.  3. The Circumflex artery is patent with moderate diffuse disease in the proximal and mid vessel. There are several small caliber OM branches with diffuse severe disease. The third Om branch has moderate non-obstructive disease. The 4th OM branch is now occluded. This is a new finding. This branch fills from left to left collaterals.  4. The RCA is  chronically occluded in the proximal segment. The vein graft to the PDA is occluded. The mid and distal RCA fills briskly from left to right collaterals.  5. Mild elevation of LV filling pressure  Recommendations: Continue medical management of CAD. He has diffuse disease with no focal targets for PCI.    Carotid artery duplex study 02/11/2019  Right Carotid: Velocities in the right ICA are consistent with a 40-59% stenosis. Left Carotid: Velocities in the left ICA are consistent with a 40-59% stenosis. Vertebrals: Bilateral vertebral arteries demonstrate antegrade flow. Subclavians: Normal flow hemodynamics were seen in bilateral subclavian arteries  . Assessment and Plan:  1. New onset a-fib ([place])   2. CAD in native artery   3. Bilateral carotid bruits   4. Ischemic cardiomyopathy   5. Mixed hyperlipidemia    1. New onset a-fib [facility]) Recent cardioversion on 10/03/2019.  Patient states he felt good until approximately 4 days ago and started feeling significantly tired and short of breath.  States he recently scratched his right ear and has been bleeding since this morning.  EKG today shows atrial fibrillation with a rate of 91, incomplete left bundle branch block, nonspecific ST and T wave abnormality.  Please refer patient back to atrial fibrillation clinic for consideration of possible amiodarone or Tikosyn initiation.  If medical therapy fails and patient remains symptomatic.  May reconsider DC cardioversion in the future.  Continue carvedilol 18.75 mg mg p.o. twice daily  2. CAD in native artery He has severe  triple-vessel disease with previous CABG.  No targets for PCI.  Medical therapy to continue.  He denies any anginal symptoms currently.  3. Bilateral carotid bruits Recent carotid artery duplex study showed 40 to 59% stenosis in both left and right ICAs.  We will need to schedule a yearly follow-up study.  4. Ischemic cardiomyopathy Last echo and October 0000000 with systolic function was mildly reduced with estimated EF of approximately 45%, regional wall motion abnormality akinesis of the mid anterior, mid anteroseptal, basal inferior septal and mid anterolateral myocardium, severe hypokinesis of the basal anteroseptal myocardium, moderate hypokinesis of the apical septal myocardium mild hypokinesis of the mid inferior septal and apical myocardium.  Trivial AR, moderate MR moderate TR.  5. Mixed hyperlipidemia Increase Crestor to 10 mg daily given evidence of moderate coronary artery stenosis.  Medication Adjustments/Labs and Tests Ordered: Current medicines are reviewed at length with the patient today.  Concerns regarding medicines are outlined above.   Disposition: Follow-up with [name] or APP 1 month  Signed, Levell July, NP 10/07/2019 11:59 AM    [facility] [address], [place], [place] [address] Phone: [phone]; Fax: [phone]

## 2019-10-07 ENCOUNTER — Ambulatory Visit (INDEPENDENT_AMBULATORY_CARE_PROVIDER_SITE_OTHER): Payer: Medicare Other | Admitting: Family Medicine

## 2019-10-07 ENCOUNTER — Encounter: Payer: Self-pay | Admitting: Family Medicine

## 2019-10-07 ENCOUNTER — Other Ambulatory Visit: Payer: Self-pay

## 2019-10-07 VITALS — BP 132/70 | HR 97 | Ht 64.0 in | Wt 165.8 lb

## 2019-10-07 DIAGNOSIS — I2510 Atherosclerotic heart disease of native coronary artery without angina pectoris: Secondary | ICD-10-CM

## 2019-10-07 DIAGNOSIS — S01311A Laceration without foreign body of right ear, initial encounter: Secondary | ICD-10-CM | POA: Diagnosis not present

## 2019-10-07 DIAGNOSIS — I4891 Unspecified atrial fibrillation: Secondary | ICD-10-CM | POA: Diagnosis not present

## 2019-10-07 DIAGNOSIS — R0989 Other specified symptoms and signs involving the circulatory and respiratory systems: Secondary | ICD-10-CM | POA: Diagnosis not present

## 2019-10-07 DIAGNOSIS — Z79899 Other long term (current) drug therapy: Secondary | ICD-10-CM | POA: Diagnosis not present

## 2019-10-07 DIAGNOSIS — E782 Mixed hyperlipidemia: Secondary | ICD-10-CM | POA: Diagnosis not present

## 2019-10-07 DIAGNOSIS — Z87891 Personal history of nicotine dependence: Secondary | ICD-10-CM | POA: Diagnosis not present

## 2019-10-07 DIAGNOSIS — Z951 Presence of aortocoronary bypass graft: Secondary | ICD-10-CM | POA: Diagnosis not present

## 2019-10-07 DIAGNOSIS — W228XXA Striking against or struck by other objects, initial encounter: Secondary | ICD-10-CM | POA: Diagnosis not present

## 2019-10-07 DIAGNOSIS — Z8249 Family history of ischemic heart disease and other diseases of the circulatory system: Secondary | ICD-10-CM | POA: Diagnosis not present

## 2019-10-07 DIAGNOSIS — I255 Ischemic cardiomyopathy: Secondary | ICD-10-CM

## 2019-10-07 DIAGNOSIS — Z7982 Long term (current) use of aspirin: Secondary | ICD-10-CM | POA: Diagnosis not present

## 2019-10-07 DIAGNOSIS — I119 Hypertensive heart disease without heart failure: Secondary | ICD-10-CM | POA: Diagnosis not present

## 2019-10-07 DIAGNOSIS — Z7902 Long term (current) use of antithrombotics/antiplatelets: Secondary | ICD-10-CM | POA: Diagnosis not present

## 2019-10-07 MED ORDER — CARVEDILOL 12.5 MG PO TABS: 18.7500 mg | ORAL_TABLET | Freq: Two times a day (BID) | ORAL | Status: DC

## 2019-10-07 MED ORDER — CARVEDILOL 25 MG PO TABS: 25.0000 mg | ORAL_TABLET | Freq: Two times a day (BID) | ORAL | 6 refills | Status: DC

## 2019-10-07 MED ORDER — ROSUVASTATIN CALCIUM 10 MG PO TABS: 10.0000 mg | ORAL_TABLET | Freq: Every day | ORAL | 6 refills | Status: DC

## 2019-10-07 NOTE — Patient Instructions (Addendum)
Medication Instructions:   Keep your Coreg at 18.75mg  twice a day.     Increase Crestor to 10mg  daily.  Continue all other medications.    Labwork: none  Testing/Procedures: none  Follow-Up: 6 weeks  Any Other Special Instructions Will Be Listed Below (If Applicable). You have been referred to:  AFib clinic   If you need a refill on your cardiac medications before your next appointment, please call your pharmacy.

## 2019-10-07 NOTE — Addendum Note (Signed)
Addended by: Laurine Blazer on: 10/07/2019 04:58 PM   Modules accepted: Orders

## 2019-10-08 ENCOUNTER — Telehealth: Payer: Self-pay | Admitting: Cardiology

## 2019-10-08 DIAGNOSIS — I4891 Unspecified atrial fibrillation: Secondary | ICD-10-CM | POA: Diagnosis not present

## 2019-10-08 DIAGNOSIS — S01311A Laceration without foreign body of right ear, initial encounter: Secondary | ICD-10-CM | POA: Diagnosis not present

## 2019-10-08 DIAGNOSIS — Z955 Presence of coronary angioplasty implant and graft: Secondary | ICD-10-CM | POA: Diagnosis not present

## 2019-10-08 DIAGNOSIS — I6523 Occlusion and stenosis of bilateral carotid arteries: Secondary | ICD-10-CM | POA: Diagnosis not present

## 2019-10-08 DIAGNOSIS — I2510 Atherosclerotic heart disease of native coronary artery without angina pectoris: Secondary | ICD-10-CM | POA: Diagnosis not present

## 2019-10-08 NOTE — Telephone Encounter (Signed)
Per phone call from [place] at [place]:   pt's BP this morning was 94/50 and Dr. Pleas Koch would like to know if we could reduce the carvedilol (COREG) 12.5 MG tablet [id number]  And losartan (COZAAR) 50 MG tablet [id number]    Also having rt ear bleeding due to Q-tip trauma - would like to pause Eliquis for a few days   Please call Carla @ [phone]

## 2019-10-08 NOTE — Telephone Encounter (Signed)
Noted.  Please refer to the office note from [place] done recently.  Hunter Townsend is back in atrial fibrillation having failed a recent cardioversion.  Depending on his current heart rate, Coreg dose could be reduced, but it might be better to focus on reducing the losartan first since he is symptomatic with the atrial fibrillation.  Unless he is having a significant amount of bleeding, it would be best not to hold Eliquis since he just had a cardioversion attempt and is most likely going to need another one in the next few weeks.  If he does need to hold the Eliquis for a few days, please make sure that this is documented in the chart as he has follow-up scheduled in the atrial fibrillation clinic and they would need to know that in terms of scheduling his next cardioversion attempt, potentially on antiarrhythmic therapy.

## 2019-10-08 NOTE — Telephone Encounter (Signed)
Left message to return call with patient.   

## 2019-10-08 NOTE — Telephone Encounter (Signed)
Notified Dr. Pleas Koch as well this morning.  Will fax this note along with OV note from yesterday.  PCP stated they are working with ENT to get bleeding stopped from ear canal & will be seeing him again this evening to follow up on that.  Also, will try to have him remain on the Eliquis, but will let our office know if this needs to be stopped.  Advised NP notes from yesterday, reducing Coreg back to 18.75mg  twice a day & referral to AFib clinic first before proceeding with DCCV first.

## 2019-10-08 NOTE — Telephone Encounter (Signed)
Patient notified

## 2019-10-12 DIAGNOSIS — I4891 Unspecified atrial fibrillation: Secondary | ICD-10-CM | POA: Diagnosis not present

## 2019-10-12 DIAGNOSIS — S01311A Laceration without foreign body of right ear, initial encounter: Secondary | ICD-10-CM | POA: Diagnosis not present

## 2019-10-12 DIAGNOSIS — I255 Ischemic cardiomyopathy: Secondary | ICD-10-CM | POA: Diagnosis not present

## 2019-10-12 DIAGNOSIS — I6523 Occlusion and stenosis of bilateral carotid arteries: Secondary | ICD-10-CM | POA: Diagnosis not present

## 2019-10-12 DIAGNOSIS — Z955 Presence of coronary angioplasty implant and graft: Secondary | ICD-10-CM | POA: Diagnosis not present

## 2019-10-15 ENCOUNTER — Ambulatory Visit (HOSPITAL_COMMUNITY)
Admission: RE | Admit: 2019-10-15 | Discharge: 2019-10-15 | Disposition: A | Discharge status: Home / Self Care | Payer: Medicare Other | Source: Ambulatory Visit | Attending: Nurse Practitioner | Admitting: Nurse Practitioner

## 2019-10-15 ENCOUNTER — Encounter (HOSPITAL_COMMUNITY): Payer: Self-pay | Admitting: Nurse Practitioner

## 2019-10-15 ENCOUNTER — Other Ambulatory Visit: Payer: Self-pay

## 2019-10-15 VITALS — BP 116/52 | HR 79 | Ht 64.0 in | Wt 163.0 lb

## 2019-10-15 DIAGNOSIS — I509 Heart failure, unspecified: Secondary | ICD-10-CM | POA: Insufficient documentation

## 2019-10-15 DIAGNOSIS — Z7982 Long term (current) use of aspirin: Secondary | ICD-10-CM | POA: Insufficient documentation

## 2019-10-15 DIAGNOSIS — I2510 Atherosclerotic heart disease of native coronary artery without angina pectoris: Secondary | ICD-10-CM | POA: Diagnosis not present

## 2019-10-15 DIAGNOSIS — Z9841 Cataract extraction status, right eye: Secondary | ICD-10-CM | POA: Insufficient documentation

## 2019-10-15 DIAGNOSIS — Z961 Presence of intraocular lens: Secondary | ICD-10-CM | POA: Insufficient documentation

## 2019-10-15 DIAGNOSIS — I4891 Unspecified atrial fibrillation: Secondary | ICD-10-CM | POA: Insufficient documentation

## 2019-10-15 DIAGNOSIS — Z951 Presence of aortocoronary bypass graft: Secondary | ICD-10-CM | POA: Insufficient documentation

## 2019-10-15 DIAGNOSIS — I4819 Other persistent atrial fibrillation: Secondary | ICD-10-CM

## 2019-10-15 DIAGNOSIS — Z9842 Cataract extraction status, left eye: Secondary | ICD-10-CM | POA: Diagnosis not present

## 2019-10-15 DIAGNOSIS — I739 Peripheral vascular disease, unspecified: Secondary | ICD-10-CM | POA: Insufficient documentation

## 2019-10-15 DIAGNOSIS — Z7901 Long term (current) use of anticoagulants: Secondary | ICD-10-CM | POA: Diagnosis not present

## 2019-10-15 DIAGNOSIS — Z87891 Personal history of nicotine dependence: Secondary | ICD-10-CM | POA: Insufficient documentation

## 2019-10-15 DIAGNOSIS — D6869 Other thrombophilia: Secondary | ICD-10-CM

## 2019-10-15 DIAGNOSIS — Z79899 Other long term (current) drug therapy: Secondary | ICD-10-CM | POA: Insufficient documentation

## 2019-10-15 DIAGNOSIS — I252 Old myocardial infarction: Secondary | ICD-10-CM | POA: Insufficient documentation

## 2019-10-15 DIAGNOSIS — Z955 Presence of coronary angioplasty implant and graft: Secondary | ICD-10-CM | POA: Insufficient documentation

## 2019-10-15 DIAGNOSIS — M1990 Unspecified osteoarthritis, unspecified site: Secondary | ICD-10-CM | POA: Insufficient documentation

## 2019-10-15 DIAGNOSIS — I110 Hypertensive heart disease with heart failure: Secondary | ICD-10-CM | POA: Diagnosis not present

## 2019-10-15 LAB — COMPREHENSIVE METABOLIC PANEL
ALT: 30 U/L (ref 0–44)
AST: 30 U/L (ref 15–41)
Albumin: 3.4 g/dL — ABNORMAL LOW (ref 3.5–5.0)
Alkaline Phosphatase: 79 U/L (ref 38–126)
Anion gap: 11 (ref 5–15)
BUN: 20 mg/dL (ref 8–23)
CO2: 24 mmol/L (ref 22–32)
Calcium: 9.1 mg/dL (ref 8.9–10.3)
Chloride: 106 mmol/L (ref 98–111)
Creatinine, Ser: 1.83 mg/dL — ABNORMAL HIGH (ref 0.61–1.24)
GFR calc Af Amer: 39 mL/min — ABNORMAL LOW (ref >60)
GFR calc non Af Amer: 34 mL/min — ABNORMAL LOW (ref >60)
Glucose, Bld: 103 mg/dL — ABNORMAL HIGH (ref 70–99)
Potassium: 4.7 mmol/L (ref 3.5–5.1)
Sodium: 141 mmol/L (ref 135–145)
Total Bilirubin: 1.4 mg/dL — ABNORMAL HIGH (ref 0.3–1.2)
Total Protein: 6.2 g/dL — ABNORMAL LOW (ref 6.5–8.1)

## 2019-10-15 LAB — TSH: TSH: 1.476 u[IU]/mL (ref 0.350–4.500)

## 2019-10-15 MED ORDER — LOSARTAN POTASSIUM 50 MG PO TABS: ORAL_TABLET | ORAL | Status: DC

## 2019-10-15 MED ORDER — CARVEDILOL 12.5 MG PO TABS: 12.5000 mg | ORAL_TABLET | Freq: Two times a day (BID) | ORAL | 3 refills | Status: DC

## 2019-10-15 MED ORDER — AMIODARONE HCL 200 MG PO TABS
ORAL_TABLET | ORAL | 0 refills | Status: DC
Start: 2019-10-15 — End: 2019-11-16

## 2019-10-15 NOTE — Patient Instructions (Signed)
Decrease coreg to 12.5mg  twice a day  Start Amiodarone to 200mg  twice a day for 1 month then reduce to 1 tablet a day  EKG in 1 week then office visit in 2 weeks in [place].

## 2019-10-15 NOTE — Progress Notes (Signed)
Primary Care Physician: Curlene Labrum, MD Referring Physician: [name] Cheek Cardiologist: Dr. Staci Righter is a 79 y.o. male with a h/o CAD, HTN, that was seen in [facility] ER and was found to be in new onset afib. Of note, he had his second covid shot 3/13. He was rate controlled and ER did not place on anticoagulation. He is currently on asa/plavix for CAD. CHA2DS2VASc score of at least 5. He feels improved today. No significant alcohol or caffeine or tobacco use. Lives alone but denies snoring history.   F/u in afib clinic, 09/09/19. He has now been on anticoagulation x 2 weeks wihout any missed doses.Marland Kitchen He remains in afib rate controlled at 74 bpm,  He would like to proceed with cardioversion after 3 weeks of uninterrupted cardioversion's. That date will be after 4/23.   F/u in afib clinic, 5/21,  referred back by Levell July as he saw pt in f/u and had returned to afib after successful cardioversion. The pt felt so much better in SR with more energy and less shortness of breath.   Today, he denies symptoms of palpitations, chest pain, shortness of breath, orthopnea, PND, lower extremity edema, dizziness, presyncope, syncope, or neurologic sequela. The patient is tolerating medications without difficulties and is otherwise without complaint today.   Past Medical History:  Diagnosis Date  . Arthritis   . BPH (benign prostatic hyperplasia)   . CHF (congestive heart failure) ([place])   . Coronary atherosclerosis of native coronary artery    Multivessel, NSTEMI  s/p PTCA/DES to mid LAD 06/24/11,, known patent LIMA-LAD and occlusion of all vein grafts  . Hypertension   . Ischemic cardiomyopathy    EF 35-40% by cath 06/24/11 (EF 37% by nuclear stress test 06/2010).  . Myocardial infarction ([place]) 2013  . PAD (peripheral artery disease) (H[facility])    Status post left SFA stent - Dr. Burt Knack   Past Surgical History:  Procedure Laterality Date  . BACK SURGERY     "cut me ~ 1/2inch  to take sciatic nerve out of my right leg"  . CARDIAC CATHETERIZATION  03/28/2016  . CARDIAC CATHETERIZATION N/A 03/28/2016   Procedure: Left Heart Cath and Cors/Grafts Angiography;  Surgeon: Leonie Man, MD;  Location: [place] [facility];  Service: Cardiovascular;  Laterality: N/A;  . CARDIOVERSION N/A 09/21/2019   Procedure: CARDIOVERSION;  Surgeon: Skeet Latch, MD;  Location: [place];  Service: Cardiovascular;  Laterality: N/A;  . CATARACT EXTRACTION W/ INTRAOCULAR LENS  IMPLANT, BILATERAL    . CORONARY ANGIOPLASTY    . CORONARY ARTERY BYPASS GRAFT  1987   [place], [place] - LIMA to LAD, SVG to RCA presumably  . LEFT HEART CATH AND CORS/GRAFTS ANGIOGRAPHY N/A 05/12/2017   Procedure: LEFT HEART CATH AND CORS/GRAFTS ANGIOGRAPHY;  Surgeon: Burnell Blanks, MD;  Location: [place] [facility];  Service: Cardiovascular;  Laterality: N/A;  . LEFT HEART CATHETERIZATION WITH CORONARY/GRAFT ANGIOGRAM  06/24/2011   Procedure: LEFT HEART CATHETERIZATION WITH Beatrix Fetters;  Surgeon: Burnell Blanks, MD;  Location: [facility] CATH LAB;  Service: Cardiovascular;;  . LEFT HEART CATHETERIZATION WITH CORONARY/GRAFT ANGIOGRAM N/A 02/26/2013   Procedure: LEFT HEART CATHETERIZATION WITH Beatrix Fetters;  Surgeon: Blane Ohara, MD;  Location: [facility] CATH LAB;  Service: Cardiovascular;  Laterality: N/A;  . LOWER EXTREMITY ANGIOGRAM N/A 10/30/2011   Procedure: LOWER EXTREMITY ANGIOGRAM;  Surgeon: Sherren Mocha, MD;  Location: [facility] CATH LAB;  Service: Cardiovascular;  Laterality: N/A;  . PERCUTANEOUS CORONARY  STENT INTERVENTION (PCI-S)  02/26/2013   Procedure: PERCUTANEOUS CORONARY STENT INTERVENTION (PCI-S);  Surgeon: Blane Ohara, MD;  Location: [facility] CATH LAB;  Service: Cardiovascular;;    Current Outpatient Medications  Medication Sig Dispense Refill  . acetaminophen (TYLENOL) 325 MG tablet Take 650 mg by mouth every 12 (twelve) hours as needed for moderate pain or headache.       Marland Kitchen apixaban (ELIQUIS) 2.5 MG TABS tablet Take 1 tablet (2.5 mg total) by mouth 2 (two) times daily. 60 tablet 3  . aspirin EC 81 MG tablet Take 1 tablet (81 mg total) by mouth daily. 90 tablet 3  . carvedilol (COREG) 12.5 MG tablet Take 1 tablet (12.5 mg total) by mouth 2 (two) times daily. 60 tablet 3  . furosemide (LASIX) 40 MG tablet TAKE 1 TABLET ONCE DAILY. (Patient taking differently: Take 40 mg by mouth daily. ) 90 tablet 1  . gabapentin (NEURONTIN) 100 MG capsule Take 100 mg by mouth 3 (three) times daily.     Marland Kitchen LORazepam (ATIVAN) 1 MG tablet Take 1 mg by mouth at bedtime as needed for anxiety (sleep).     . losartan (COZAAR) 50 MG tablet Taking 1/2 tablet by mouth daily    . Multiple Vitamin (MULTIVITAMIN) tablet Take 1 tablet by mouth daily.    . nitroGLYCERIN (NITROSTAT) 0.4 MG SL tablet Place 1 tablet (0.4 mg total) under the tongue every 5 (five) minutes x 3 doses as needed for chest pain (if no relief after 3rd dose, proceed to the ED for an evaluation or call 911). For chest pain 25 tablet 3  . Omega-3 Fatty Acids (FISH OIL) 1000 MG CAPS Take 2,000 mg by mouth daily.    Marland Kitchen OVER THE COUNTER MEDICATION Place 1 application rectally as needed (constipation). Suppository to help with BM with Occasional fleet enema     . pantoprazole (PROTONIX) 40 MG tablet TAKE 1 TABLET TWICE DAILY BEFORE A MEAL. (Patient taking differently: Take 40 mg by mouth daily. ) 180 tablet 3  . potassium chloride (KLOR-CON) 10 MEQ tablet Take 10 mEq by mouth daily.     . ranolazine (RANEXA) 500 MG 12 hr tablet TAKE 1 TABLET EVERY 12 HOURS. 60 tablet 0  . rosuvastatin (CRESTOR) 10 MG tablet Take 1 tablet (10 mg total) by mouth daily. 30 tablet 6  . triamcinolone cream (KENALOG) 0.1 % Apply 1 application topically daily as needed (rash).     Marland Kitchen amiodarone (PACERONE) 200 MG tablet Take 1 tablet twice a day for 1 month then reduce to 1 tablet a day 60 tablet 0   No current facility-administered medications for this  encounter.    No Known Allergies  Social History   Socioeconomic History  . Marital status: Widowed    Spouse name: Not on file  . Number of children: Not on file  . Years of education: Not on file  . Highest education level: Not on file  Occupational History  . Occupation: Retired    Comment: Orthoptist  Tobacco Use  . Smoking status: Former Smoker    Packs/day: 2.00    Years: 40.00    Pack years: 80.00    Types: Cigarettes    Quit date: 05/27/1993    Years since quitting: 26.4  . Smokeless tobacco: Never Used  Substance and Sexual Activity  . Alcohol use: No    Alcohol/week: 0.0 standard drinks  . Drug use: No  . Sexual activity: Not Currently  Other Topics Concern  .  Not on file  Social History Narrative  . Not on file   Social Determinants of Health   Financial Resource Strain:   . Difficulty of Paying Living Expenses:   Food Insecurity:   . Worried About Charity fundraiser in the Last Year:   . Arboriculturist in the Last Year:   Transportation Needs:   . Film/video editor (Medical):   Marland Kitchen Lack of Transportation (Non-Medical):   Physical Activity:   . Days of Exercise per Week:   . Minutes of Exercise per Session:   Stress:   . Feeling of Stress :   Social Connections:   . Frequency of Communication with Friends and Family:   . Frequency of Social Gatherings with Friends and Family:   . Attends Religious Services:   . Active Member of Clubs or Organizations:   . Attends Archivist Meetings:   Marland Kitchen Marital Status:   Intimate Partner Violence:   . Fear of Current or Ex-Partner:   . Emotionally Abused:   Marland Kitchen Physically Abused:   . Sexually Abused:     Family History  Problem Relation Age of Onset  . CAD Other        pt. says he is unsure of the relationship  . Hypertension Other   . Colon cancer Neg Hx   . Gastric cancer Neg Hx   . Esophageal cancer Neg Hx     ROS- All systems are reviewed and negative except as per the HPI  above  Physical Exam: Vitals:   10/15/19 1058  BP: (!) 116/52  Pulse: 79  Weight: 73.9 kg  Height: 5\' 4"  (1.626 m)   Wt Readings from Last 3 Encounters:  10/15/19 73.9 kg  10/07/19 75.2 kg  09/21/19 71.7 kg    Labs: Lab Results  Component Value Date   NA 141 10/15/2019   K 4.7 10/15/2019   CL 106 10/15/2019   CO2 24 10/15/2019   GLUCOSE 103 (H) 10/15/2019   BUN 20 10/15/2019   CREATININE 1.83 (H) 10/15/2019   CALCIUM 9.1 10/15/2019   Lab Results  Component Value Date   INR 1.33 05/12/2017   Lab Results  Component Value Date   CHOL 152 06/25/2011   HDL 28 (L) 06/25/2011   LDLCALC 94 06/25/2011   TRIG 150 (H) 06/25/2011     GEN- The patient is well appearing, alert and oriented x 3 today.   Head- normocephalic, atraumatic Eyes-  Sclera clear, conjunctiva pink Ears- hearing intact Oropharynx- clear Neck- supple, no JVP Lymph- no cervical lymphadenopathy Lungs- Clear to ausculation bilaterally, normal work of breathing Heart- irregular rate and rhythm, no murmurs, rubs or gallops, PMI not laterally displaced GI- soft, NT, ND, + BS Extremities- no clubbing, cyanosis, or edema MS- no significant deformity or atrophy Skin- no rash or lesion Psych- euthymic mood, full affect Neuro- strength and sensation are intact  EKG-afib at 79 bpm, qrs int 110 ms, qtc 454 ms Epic records reviewed and records were forwarded form [facility] ER    Assessment and Plan: 1. Afib  New onset Successful DCCV but with early  return afib(ERAF) I discussed Tikosyn vrs amiodarone He is not in favor of the 4 day hospitalization or the cost of Tikosyn  He has opted for amiodarone He does want his care from  the [place] area, it is very inconvenient for him to drive here Risk vrs benefit of drug explained  Cmet/tsh for amio start  baseline today My  plan for amiodarone loading is as follows:  1. Amiodarone 200 mg bid x 4 weeks then set up cardioversion, assessing pt has not  missed any of his anticoagulation x 3 weeks prior to cardioversion - continue at 200 mg bid until he sees cardiology back at one week after cardioversion and if in SR, reduce to 200 mg daily Reduce  carvedilol 12.5 mg bid  with amiodarone loading  He will need to have an EKG in one week to assess qt/rythm  and then bring back in 2 additional weeks to get cardioversion scheduled He will need tsh/cmet every 6 months, CXR or PFT's every year for surveillance of amiodarone    2. CHA2DS2VASc score of 5 Pt is now off  plavix and continues on ASA only for his CAD Continues on eliquis 2.5 mg bid   Butch Penny C. Martine Trageser, [facility] [address] [place], [place] [address] [phone]

## 2019-10-18 ENCOUNTER — Other Ambulatory Visit: Payer: Self-pay | Admitting: Cardiology

## 2019-10-21 ENCOUNTER — Ambulatory Visit (INDEPENDENT_AMBULATORY_CARE_PROVIDER_SITE_OTHER): Payer: Medicare Other | Admitting: *Deleted

## 2019-10-21 ENCOUNTER — Other Ambulatory Visit: Payer: Self-pay

## 2019-10-21 DIAGNOSIS — I4819 Other persistent atrial fibrillation: Secondary | ICD-10-CM | POA: Diagnosis not present

## 2019-10-21 NOTE — Progress Notes (Signed)
Presents to office for EKG per last office visit at [facility].

## 2019-10-25 DIAGNOSIS — N183 Chronic kidney disease, stage 3 unspecified: Secondary | ICD-10-CM | POA: Diagnosis not present

## 2019-10-25 DIAGNOSIS — Z87891 Personal history of nicotine dependence: Secondary | ICD-10-CM | POA: Diagnosis not present

## 2019-10-25 DIAGNOSIS — I2510 Atherosclerotic heart disease of native coronary artery without angina pectoris: Secondary | ICD-10-CM | POA: Diagnosis not present

## 2019-10-25 DIAGNOSIS — I129 Hypertensive chronic kidney disease with stage 1 through stage 4 chronic kidney disease, or unspecified chronic kidney disease: Secondary | ICD-10-CM | POA: Diagnosis not present

## 2019-10-28 NOTE — Progress Notes (Signed)
Cardiology Office Note  Date: 10/29/2019   ID: Hunter Townsend, DOB 07/14/1940, MRN [id number]  PCP:  Hunter Labrum, MD  Cardiologist:  Hunter Lesches, MD Electrophysiologist:  None   Chief Complaint: Follow-up atrial fibrillation, CAD, bilateral carotid bruit, ischemic cardiomyopathy, mixed hyperlipidemia.  History of Present Illness: Hunter Townsend is a 79 y.o. male with a history of atrial fibrillation, CAD, bilateral carotid bruit, ischemic cardiomyopathy, mixed hyperlipidemia.  Last encounter was Hunter Townsend on 02/08/2019 for routine office visit.  At that time he reported no active angina and clinically stable is current medical therapy.  He was continuing DAPT therapy.  Carotid Dopplers were ordered for bilateral carotid bruits.  His most recent EF was 45% and undergoing conservative management.  Activity limited by chronic in back and neck pain.  Still attempting to go walking most days.  Was having pain injections.  He was doing well on Ranexa in terms of his anginal control.  Recent presentation to [facility] emergency room was found to be in new onset atrial fibrillation.  He had recently received his Covid vaccination on 08/07/2019.  He was rate controlled in ER and not placed on anticoagulation.  He was seen at [facility] atrial fibrillation clinic on 08/26/2019 by Hunter Palau, NP.  His rate was controlled at atrial fibrillation clinic he was continue his carvedilol 1/2 tablets of 12.5 mg twice daily.  Mrs. Hunter Townsend sent epic message to Hunter Townsend.  Plavix was stopped.  Eliquis was started 2.5 mg p.o. twice daily (age greater than 50, creatinine 1.84, weight 153.  Ms. Hunter Townsend subsequently followed up on 09/09/2019.  Patient had been anticoagulated x2 weeks without any missed doses.  He wanted to proceed with cardioversion after 3 weeks of uninterrupted anticoagulation. His CHA2DS2-VASc was 5.  He underwent DC cardioversion 09/21/2019 Dr. Oval Townsend.  Cardioverted 1 time at 150 J.   Post procedure EKG showed sinus frequent PACs.  Patient tolerated procedure well.   Patient states he was doing well up until 4 days ago when he became suddenly more tired and short of breath.  He states he can barely do anything.  Has significant activity intolerance.  Nursing staff states they believe he is back in atrial fibrillation.  He denies any other symptoms such as CVA or TIA-like symptoms.  He does admit to some dizziness but no near syncopal or syncopal episodes.    He was referred back to atrial flutter clinic and seen on 10/15/2019.  Amiodarone 200 mg p.o. twice daily was ordered x4 weeks then set up for cardioversion with assessment of patient ensuring he has not missed any of his anticoagulation for 3 weeks prior to cardioversion.  Plans were to continue amiodarone 200 twice daily until he sees cardiology back 1 week after cardioversion and is in sinus rhythm reduce to 200 mg daily.  Carvedilol was reduced to 12.5 mg twice daily.  He was to have an EKG 1 week to assess QTC and rhythm.  He was to come back in 2 weeks to get cardioversion schedule.  He would need TSH and c-Met every 6 months as well as chest x-ray or PFTs every year for surveillance of amiodarone.  Follow-up EKG on 10/21/2019 showed normal sinus rhythm rate of 65 QT 446 ms QTC 464.  Diffuse nonspecific T wave abnormality.  He returns for follow-up today stating he just feels "crummy".  States he has no energy.  His blood pressure is 96/52 and heart rate is 60.  Denies  any palpitations or arrhythmias, orthostatic symptoms, CVA or TIA-like symptoms, bleeding in stool or urine.  Denies any PND, orthopnea, or lower extremity edema.  He is back in a normal sinus rhythm after starting amiodarone.  He was originally slated for cardioversion after loading with amiodarone.  He continues to load with amiodarone until June 21st at which point he decreases his amiodarone dose down to 200 mg daily only.   Past Medical History:  Diagnosis  Date  . Arthritis   . BPH (benign prostatic hyperplasia)   . CHF (congestive heart failure) ([place])   . Coronary atherosclerosis of native coronary artery    Multivessel, NSTEMI  s/p PTCA/DES to mid LAD 06/24/11,, known patent LIMA-LAD and occlusion of all vein grafts  . Hypertension   . Ischemic cardiomyopathy    EF 35-40% by cath 06/24/11 (EF 37% by nuclear stress test 06/2010).  . Myocardial infarction ([place]) 2013  . PAD (peripheral artery disease) ([facility])    Status post left SFA stent - Dr. Burt Townsend    Past Surgical History:  Procedure Laterality Date  . BACK SURGERY     "cut me ~ 1/2inch to take sciatic nerve out of my right leg"  . CARDIAC CATHETERIZATION  03/28/2016  . CARDIAC CATHETERIZATION N/A 03/28/2016   Procedure: Left Heart Cath and Cors/Grafts Angiography;  Surgeon: Hunter Man, MD;  Location: [place] [facility];  Service: Cardiovascular;  Laterality: N/A;  . CARDIOVERSION N/A 09/21/2019   Procedure: CARDIOVERSION;  Surgeon: Hunter Latch, MD;  Location: [place];  Service: Cardiovascular;  Laterality: N/A;  . CATARACT EXTRACTION W/ INTRAOCULAR LENS  IMPLANT, BILATERAL    . CORONARY ANGIOPLASTY    . CORONARY ARTERY BYPASS GRAFT  1987   Hunter Townsend, CT - LIMA to LAD, SVG to RCA presumably  . LEFT HEART CATH AND CORS/GRAFTS ANGIOGRAPHY N/A 05/12/2017   Procedure: LEFT HEART CATH AND CORS/GRAFTS ANGIOGRAPHY;  Surgeon: Burnell Blanks, MD;  Location: [place] [facility];  Service: Cardiovascular;  Laterality: N/A;  . LEFT HEART CATHETERIZATION WITH CORONARY/GRAFT ANGIOGRAM  06/24/2011   Procedure: LEFT HEART CATHETERIZATION WITH Beatrix Fetters;  Surgeon: Burnell Blanks, MD;  Location: [facility] CATH LAB;  Service: Cardiovascular;;  . LEFT HEART CATHETERIZATION WITH CORONARY/GRAFT ANGIOGRAM N/A 02/26/2013   Procedure: LEFT HEART CATHETERIZATION WITH Beatrix Fetters;  Surgeon: Blane Ohara, MD;  Location: [facility] CATH LAB;  Service: Cardiovascular;   Laterality: N/A;  . LOWER EXTREMITY ANGIOGRAM N/A 10/30/2011   Procedure: LOWER EXTREMITY ANGIOGRAM;  Surgeon: Sherren Mocha, MD;  Location: [facility] CATH LAB;  Service: Cardiovascular;  Laterality: N/A;  . PERCUTANEOUS CORONARY STENT INTERVENTION (PCI-S)  02/26/2013   Procedure: PERCUTANEOUS CORONARY STENT INTERVENTION (PCI-S);  Surgeon: Blane Ohara, MD;  Location: [facility] CATH LAB;  Service: Cardiovascular;;    Current Outpatient Medications  Medication Sig Dispense Refill  . acetaminophen (TYLENOL) 325 MG tablet Take 650 mg by mouth every 12 (twelve) hours as needed for moderate pain or headache.     Marland Kitchen amiodarone (PACERONE) 200 MG tablet Take 1 tablet twice a day for 1 month then reduce to 1 tablet a day 60 tablet 0  . apixaban (ELIQUIS) 2.5 MG TABS tablet Take 1 tablet (2.5 mg total) by mouth 2 (two) times daily. 60 tablet 3  . aspirin EC 81 MG tablet Take 1 tablet (81 mg total) by mouth daily. 90 tablet 3  . carvedilol (COREG) 3.125 MG tablet Take 1 tablet (3.125 mg total) by mouth 2 (two) times daily. 60 tablet  6  . furosemide (LASIX) 40 MG tablet TAKE 1 TABLET ONCE DAILY. (Patient taking differently: Take 40 mg by mouth daily. ) 90 tablet 1  . gabapentin (NEURONTIN) 100 MG capsule Take 100 mg by mouth 3 (three) times daily.     Marland Kitchen LORazepam (ATIVAN) 1 MG tablet Take 1 mg by mouth at bedtime as needed for anxiety (sleep).     . losartan (COZAAR) 50 MG tablet TAKE 1 TABLET ONCE DAILY. 90 tablet 1  . Multiple Vitamin (MULTIVITAMIN) tablet Take 1 tablet by mouth daily.    . nitroGLYCERIN (NITROSTAT) 0.4 MG SL tablet Place 1 tablet (0.4 mg total) under the tongue every 5 (five) minutes x 3 doses as needed for chest pain (if no relief after 3rd dose, proceed to the ED for an evaluation or call 911). For chest pain 25 tablet 3  . Omega-3 Fatty Acids (FISH OIL) 1000 MG CAPS Take 2,000 mg by mouth daily.    Marland Kitchen OVER THE COUNTER MEDICATION Place 1 application rectally as needed (constipation). Suppository to  help with BM with Occasional fleet enema     . pantoprazole (PROTONIX) 40 MG tablet TAKE 1 TABLET TWICE DAILY BEFORE A MEAL. (Patient taking differently: Take 40 mg by mouth daily. ) 180 tablet 3  . potassium chloride (KLOR-CON) 10 MEQ tablet Take 10 mEq by mouth daily.     . ranolazine (RANEXA) 500 MG 12 hr tablet TAKE 1 TABLET EVERY 12 HOURS. 60 tablet 0  . rosuvastatin (CRESTOR) 10 MG tablet Take 1 tablet (10 mg total) by mouth daily. 30 tablet 6  . triamcinolone cream (KENALOG) 0.1 % Apply 1 application topically daily as needed (rash).      No current facility-administered medications for this visit.   Allergies:  Patient has no known allergies.   Social History: The patient  reports that he quit smoking about 26 years ago. His smoking use included cigarettes. He has a 80.00 pack-year smoking history. He has never used smokeless tobacco. He reports that he does not drink alcohol or use drugs.   Family History: The patient's family history includes CAD in an other family member; Hypertension in an other family member.   ROS:  Please see the history of present illness. Otherwise, complete review of systems is positive for none.  All other systems are reviewed and negative.   Physical Exam: VS:  BP (!) 96/52   Pulse 61   Ht 5' 4"  (1.626 m)   Wt 160 lb 6.4 oz (72.8 kg)   SpO2 99%   BMI 27.53 kg/m , BMI Body mass index is 27.53 kg/m.  Wt Readings from Last 3 Encounters:  10/29/19 160 lb 6.4 oz (72.8 kg)  10/15/19 163 lb (73.9 kg)  10/07/19 165 lb 12.8 oz (75.2 kg)    General: Patient appears comfortable at rest. Neck: Supple, no elevated JVP or carotid bruits, no thyromegaly. Lungs: Clear to auscultation, nonlabored breathing at rest. Cardiac: Regular rate and rhythm, no S3 or significant systolic murmur, no pericardial rub. Extremities: No pitting edema, distal pulses 2+. Skin: Warm and dry. Musculoskeletal: No kyphosis. Neuropsychiatric: Alert and oriented x3, affect grossly  appropriate.  ECG:  An ECG dated 10/29/2019 was personally reviewed today and demonstrated:  Normal sinus rhythm rate of 60, possible anterior infarct, age undetermined.  Recent Labwork: 09/09/2019: Platelets 204 09/21/2019: Hemoglobin 10.9 10/15/2019: ALT 30; AST 30; BUN 20; Creatinine, Ser 1.83; Potassium 4.7; Sodium 141; TSH 1.476     Component Value Date/Time  CHOL 152 06/25/2011 0400   TRIG 150 (H) 06/25/2011 0400   HDL 28 (L) 06/25/2011 0400   CHOLHDL 5.4 06/25/2011 0400   VLDL 30 06/25/2011 0400   LDLCALC 94 06/25/2011 0400    Other Studies Reviewed Today:  Cardiac catheterization 05/12/2017   Prox RCA lesion is 100% stenosed.  Dist LM to Ost LAD lesion is 100% stenosed.  LIMA graft was visualized by angiography and is normal in caliber.  Dist Graft to Insertion lesion is 20% stenosed.  Mid LAD lesion is 30% stenosed.  Prox Cx to Mid Cx lesion is 50% stenosed.  Ost 2nd Mrg to 2nd Mrg lesion is 50% stenosed.  Ost 3rd Mrg lesion is 100% stenosed.  Origin lesion is 100% stenosed.  Origin lesion is 100% stenosed.  SVG graft was not visualized.  SVG graft was not visualized.   1. 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 Presumed that the vein grafts supplied the RCA and OM branches and have been chronically occluded.  2. The ostial LAD is chronically occluded. The LIMA graft to the LAD is patent. The stented segment of the graft at the anastomosis to the LAD is patent with minimal restenosis. The mid LAD stent just beyond the anastomosis is patent with minimal restenosis.  3. The Circumflex artery is patent with moderate diffuse disease in the proximal and mid vessel. There are several small caliber OM branches with diffuse severe disease. The third Om branch has moderate non-obstructive disease. The 4th OM branch is now occluded. This is a new finding. This branch fills from left to left collaterals.  4. The RCA is  chronically occluded in the proximal segment. The vein graft to the PDA is occluded. The mid and distal RCA fills briskly from left to right collaterals.  5. Mild elevation of LV filling pressure  Recommendations: Continue medical management of CAD. He has diffuse disease with no focal targets for PCI.    Carotid artery duplex study 02/11/2019  Right Carotid: Velocities in the right ICA are consistent with a 40-59% stenosis. Left Carotid: Velocities in the left ICA are consistent with a 40-59% stenosis. Vertebrals: Bilateral vertebral arteries demonstrate antegrade flow. Subclavians: Normal flow hemodynamics were seen in bilateral subclavian arteries  . Assessment and Plan:   1. New onset a-fib [facility]) Recent cardioversion on 10/03/2019.  Patient states he felt good until approximately 4 days prior to visit and started feeling significantly tired and short of breath.  He was back in atrial fibrillation and was sent back to A. fib clinic where he was started on amiodarone.  Plans were to schedule a cardioversion after loading him with amiodarone.  He converted to normal sinus rhythm which he remains in today.  He is still loading his amiodarone until the 21st of the month at which time he will reduce his dosage to 200 mg daily.  2. CAD in native artery He has severe triple-vessel disease with previous CABG.  No targets for PCI.  Medical therapy to continue.  He denies any anginal symptoms currently.  Continue aspirin 81, sublingual nitroglycerin as needed mg daily, carvedilol 3.125 mg twice daily.  . 3. Bilateral carotid bruits Recent carotid artery duplex study showed 40 to 59% stenosis in both left and right ICAs.  We will need to schedule a yearly follow-up study.  4. Ischemic cardiomyopathy Last echo and October 0932 with systolic function was mildly reduced with estimated EF of approximately 45%,  regional wall motion abnormality akinesis of the mid anterior, mid anteroseptal, basal  inferior septal and mid anterolateral myocardium, severe hypokinesis of the basal anteroseptal myocardium, moderate hypokinesis of the apical septal myocardium mild hypokinesis of the mid inferior septal and apical myocardium.  Trivial AR, moderate MR moderate TR.  5. Mixed hyperlipidemia Continue Crestor to 10 mg daily   6.  Hypotension and bradycardia. He presents today with a blood pressure of 96/52 and a heart rate of 60.  States he feels crummy.  DC carvedilol 12.5 mg p.o. twice daily.  Start carvedilol 3.125 mg p.o. twice daily.   Medication Adjustments/Labs and Tests Ordered: Current medicines are reviewed at length with the patient today.  Concerns regarding medicines are outlined above.   Disposition: Follow-up with Hunter Townsend or APP 1 month  Signed, Levell July, NP 10/29/2019 5:01 PM    [facility] at [place], [place],  [address] Phone: [phone]; Fax: [phone]

## 2019-10-29 ENCOUNTER — Ambulatory Visit (INDEPENDENT_AMBULATORY_CARE_PROVIDER_SITE_OTHER): Payer: Medicare Other | Admitting: Family Medicine

## 2019-10-29 ENCOUNTER — Other Ambulatory Visit: Payer: Self-pay

## 2019-10-29 ENCOUNTER — Encounter: Payer: Self-pay | Admitting: Family Medicine

## 2019-10-29 VITALS — BP 96/52 | HR 61 | Ht 64.0 in | Wt 160.4 lb

## 2019-10-29 DIAGNOSIS — R0989 Other specified symptoms and signs involving the circulatory and respiratory systems: Secondary | ICD-10-CM

## 2019-10-29 DIAGNOSIS — E782 Mixed hyperlipidemia: Secondary | ICD-10-CM

## 2019-10-29 DIAGNOSIS — I480 Paroxysmal atrial fibrillation: Secondary | ICD-10-CM

## 2019-10-29 DIAGNOSIS — I2510 Atherosclerotic heart disease of native coronary artery without angina pectoris: Secondary | ICD-10-CM | POA: Diagnosis not present

## 2019-10-29 DIAGNOSIS — I255 Ischemic cardiomyopathy: Secondary | ICD-10-CM | POA: Diagnosis not present

## 2019-10-29 MED ORDER — CARVEDILOL 3.125 MG PO TABS: 3.1250 mg | ORAL_TABLET | Freq: Two times a day (BID) | ORAL | 6 refills | Status: DC

## 2019-10-29 NOTE — Patient Instructions (Signed)
Medication Instructions:   Decrease Coreg to 3.125mg  twice a day.  Continue all other medications.    Labwork: none  Testing/Procedures: none  Follow-Up: 1 month   Any Other Special Instructions Will Be Listed Below (If Applicable).  If you need a refill on your cardiac medications before your next appointment, please call your pharmacy.

## 2019-11-01 DIAGNOSIS — M25579 Pain in unspecified ankle and joints of unspecified foot: Secondary | ICD-10-CM | POA: Diagnosis not present

## 2019-11-01 DIAGNOSIS — M79672 Pain in left foot: Secondary | ICD-10-CM | POA: Diagnosis not present

## 2019-11-01 DIAGNOSIS — M79671 Pain in right foot: Secondary | ICD-10-CM | POA: Diagnosis not present

## 2019-11-16 ENCOUNTER — Other Ambulatory Visit (HOSPITAL_COMMUNITY): Payer: Self-pay | Admitting: Nurse Practitioner

## 2019-11-24 DIAGNOSIS — H93A2 Pulsatile tinnitus, left ear: Secondary | ICD-10-CM | POA: Diagnosis not present

## 2019-11-24 DIAGNOSIS — I255 Ischemic cardiomyopathy: Secondary | ICD-10-CM | POA: Diagnosis not present

## 2019-11-24 DIAGNOSIS — I6523 Occlusion and stenosis of bilateral carotid arteries: Secondary | ICD-10-CM | POA: Diagnosis not present

## 2019-11-24 DIAGNOSIS — Z955 Presence of coronary angioplasty implant and graft: Secondary | ICD-10-CM | POA: Diagnosis not present

## 2019-11-24 DIAGNOSIS — I2510 Atherosclerotic heart disease of native coronary artery without angina pectoris: Secondary | ICD-10-CM | POA: Diagnosis not present

## 2019-11-24 DIAGNOSIS — Z951 Presence of aortocoronary bypass graft: Secondary | ICD-10-CM | POA: Diagnosis not present

## 2019-11-24 DIAGNOSIS — R42 Dizziness and giddiness: Secondary | ICD-10-CM | POA: Diagnosis not present

## 2019-11-29 NOTE — Progress Notes (Signed)
Cardiology Office Note  Date: 11/30/2019   ID: [facility], DOB May 19, 1941, MRN [id number]  PCP:  Hunter Labrum, MD  Cardiologist:  Hunter Lesches, MD Electrophysiologist:  None   Chief Complaint: Follow-up atrial fibrillation, CAD, bilateral carotid bruit, ischemic cardiomyopathy, mixed hyperlipidemia.  History of Present Illness: Hunter [facility] is a 79 y.o. male with a history of atrial fibrillation, CAD, bilateral carotid bruit, ischemic cardiomyopathy, mixed hyperlipidemia.  Last encounter was [facility] on 02/08/2019 for routine office visit.  At that time he reported no active angina and clinically stable is current medical therapy.  He was continuing DAPT therapy.  Carotid Dopplers were ordered for bilateral carotid bruits.  His most recent EF was 45% and undergoing conservative management.  Activity limited by chronic in back and neck pain.  Still attempting to go walking most days.  Was having pain injections.  He was doing well on Ranexa in terms of his anginal control.  Recent presentation to [facility] emergency room was found to be in new onset atrial fibrillation.  He had recently received his Covid vaccination on 08/07/2019.  He was rate controlled in ER and not placed on anticoagulation.  He was seen at [facility] fibrillation [facility] on 08/26/2019 by Hunter Palau, NP.  His rate was controlled at atrial fibrillation clinic he was continue his carvedilol 1/2 tablets of 12.5 mg twice daily.  Mrs. Hunter [facility] sent epic message to [facility].  Plavix was stopped.  Eliquis was started 2.5 mg p.o. twice daily (age greater than 58, creatinine 1.84, weight 153.  Ms. [facility] subsequently followed up on 09/09/2019.  Patient had been anticoagulated x2 weeks without any missed doses.  He wanted to proceed with cardioversion after 3 weeks of uninterrupted anticoagulation. His CHA2DS2-VASc was 5.  He underwent DC cardioversion 09/21/2019 Dr. Oval [facility].  Cardioverted 1 time at 150 J.   Post procedure EKG showed sinus frequent PACs.  Patient tolerated procedure well. He eventually reverted back to atrial fibrillation with associated SOB and significant fatigue and activity intolerance. He admitted to some dizziness.  He was referred back to atrial flutter clinic and seen on 10/15/2019.  Hunter Townsend 200 mg p.o. twice daily was ordered x4 weeks then set up for cardioversion with assessment of patient ensuring he had not missed any of his anticoagulation for 3 weeks prior to cardioversion.  Plans were to continue Hunter Townsend 200 twice daily until he saw cardiology back 1 week after cardioversion and  in sinus rhythm reduce Hunter Townsend to 200 mg daily.  Carvedilol was reduced to 12.5 mg twice daily.  He was to have an EKG 1 week to assess QTC and rhythm.  He was to come back in 2 weeks to get cardioversion schedule.  He would need TSH and c-Met every 6 months as well as chest x-ray or PFTs every year for surveillance of Hunter Townsend.  Follow-up EKG on 10/21/2019 showed normal sinus rhythm rate of 65 QT 446 ms QTC 464.  Diffuse nonspecific T wave abnormality.  Saw me at last visit stating he just felt "crummy".  Stated he had no energy.  His blood pressure was 96/52 and heart rate 60.  Denied any palpitations or arrhythmias, orthostatic symptoms, CVA or TIA-like symptoms, bleeding in stool or urine.  Denied any PND, orthopnea, or lower extremity edema. He was back in a normal sinus rhythm after starting Hunter Townsend.  He was originally slated for cardioversion after loading with Hunter Townsend. He continued to load with Hunter Townsend until June 21st  at which point he decreased  his Hunter Townsend dose down to 200 mg daily only.  He presents today with no complaints of feeling "crummy" as he did at last visit.  We decreased his carvedilol down to 3.125 mg p.o. twice daily due to low blood pressure of 96/52 at last visit.  Blood pressure today is 122/54 with a heart rate of 63.  He states the reason he feels  dizziness is because he has issues with his cervical spine and it causes so much pain he feels dizzy but no frank syncopal episodes.  States he had been receiving joint injections in these areas for pain relief by orthopedics.  States he has been having some recent issues with decreased appetite.  He describes eating 2 small meals for breakfast and lunch and a regular dinner meal.  Advised him he could consult with PCP for possibly starting Hunter Townsend for appetite stimulant.   Past Medical History:  Diagnosis Date  . Arthritis   . BPH (benign prostatic hyperplasia)   . CHF (congestive heart failure) ([facility])   . Coronary atherosclerosis of native coronary artery    Multivessel, NSTEMI  s/p PTCA/DES to mid LAD 06/24/11,, known patent LIMA-LAD and occlusion of all vein grafts  . Hypertension   . Ischemic cardiomyopathy    EF 35-40% by cath 06/24/11 (EF 37% by nuclear stress test 06/2010).  . Myocardial infarction ([facility]) 2013  . PAD (peripheral artery disease) ([facility])    Status post left SFA stent - Dr. Burt Knack    Past Surgical History:  Procedure Laterality Date  . BACK SURGERY     "cut me ~ 1/2inch to take sciatic nerve out of my right leg"  . CARDIAC CATHETERIZATION  03/28/2016  . CARDIAC CATHETERIZATION N/A 03/28/2016   Procedure: Left Heart Cath and Cors/Grafts Angiography;  Surgeon: Leonie Man, MD;  Location: [facility];  Service: Cardiovascular;  Laterality: N/A;  . CARDIOVERSION N/A 09/21/2019   Procedure: CARDIOVERSION;  Surgeon: Skeet Latch, MD;  Location: [facility];  Service: Cardiovascular;  Laterality: N/A;  . CATARACT EXTRACTION W/ INTRAOCULAR LENS  IMPLANT, BILATERAL    . CORONARY ANGIOPLASTY    . CORONARY ARTERY BYPASS GRAFT  1987   [place], [place] - LIMA to LAD, SVG to RCA presumably  . LEFT HEART CATH AND CORS/GRAFTS ANGIOGRAPHY N/A 05/12/2017   Procedure: LEFT HEART CATH AND CORS/GRAFTS ANGIOGRAPHY;  Surgeon: Burnell Blanks, MD;  Location: [facility];  Service: Cardiovascular;  Laterality: N/A;  . LEFT HEART CATHETERIZATION WITH CORONARY/GRAFT ANGIOGRAM  06/24/2011   Procedure: LEFT HEART CATHETERIZATION WITH Beatrix Fetters;  Surgeon: Burnell Blanks, MD;  Location: [facility] C[facility];  Service: Cardiovascular;;  . LEFT HEART CATHETERIZATION WITH CORONARY/GRAFT ANGIOGRAM N/A 02/26/2013   Procedure: LEFT HEART CATHETERIZATION WITH Beatrix Fetters;  Surgeon: Blane Ohara, MD;  Location: [facility] CATH LAB;  Service: Cardiovascular;  Laterality: N/A;  . LOWER EXTREMITY ANGIOGRAM N/A 10/30/2011   Procedure: LOWER EXTREMITY ANGIOGRAM;  Surgeon: Sherren Mocha, MD;  Location: [facility];  Service: Cardiovascular;  Laterality: N/A;  . PERCUTANEOUS CORONARY STENT INTERVENTION (PCI-S)  02/26/2013   Procedure: PERCUTANEOUS CORONARY STENT INTERVENTION (PCI-S);  Surgeon: Blane Ohara, MD;  Location: [facility];  Service: Cardiovascular;;    Current Outpatient Medications  Medication Sig Dispense Refill  . acetaminophen (TYLENOL) 325 MG tablet Take 650 mg by mouth every 12 (twelve) hours as needed for moderate pain or headache.     Marland Kitchen [facility] (PACERONE) 200  MG tablet Take 1 tablet (200 mg total) by mouth daily. 30 tablet 3  . apixaban (ELIQUIS) 2.5 MG TABS tablet Take 1 tablet (2.5 mg total) by mouth 2 (two) times daily. 60 tablet 3  . aspirin EC 81 MG tablet Take 1 tablet (81 mg total) by mouth daily. 90 tablet 3  . carvedilol (COREG) 3.125 MG tablet Take 1 tablet (3.125 mg total) by mouth 2 (two) times daily. 60 tablet 6  . furosemide (LASIX) 40 MG tablet TAKE 1 TABLET ONCE DAILY. (Patient taking differently: Take 40 mg by mouth daily. ) 90 tablet 1  . gabapentin (NEURONTIN) 100 MG capsule Take 100 mg by mouth 3 (three) times daily.     Marland Kitchen LORazepam (ATIVAN) 1 MG tablet Take 1 mg by mouth at bedtime as needed for anxiety (sleep).     . losartan (COZAAR) 50 MG tablet TAKE 1 TABLET ONCE DAILY. 90 tablet 1  . Multiple Vitamin  (MULTIVITAMIN) tablet Take 1 tablet by mouth daily.    . nitroGLYCERIN (NITROSTAT) 0.4 MG SL tablet Place 1 tablet (0.4 mg total) under the tongue every 5 (five) minutes x 3 doses as needed for chest pain (if no relief after 3rd dose, proceed to the ED for an evaluation or call 911). For chest pain 25 tablet 3  . Omega-3 Fatty Acids (FISH OIL) 1000 MG CAPS Take 2,000 mg by mouth daily.    Marland Kitchen OVER THE COUNTER MEDICATION Place 1 application rectally as needed (constipation). Suppository to help with BM with Occasional fleet enema     . pantoprazole (PROTONIX) 40 MG tablet TAKE 1 TABLET TWICE DAILY BEFORE A MEAL. (Patient taking differently: Take 40 mg by mouth daily. ) 180 tablet 3  . potassium chloride (KLOR-CON) 10 MEQ tablet Take 10 mEq by mouth daily.     . ranolazine (RANEXA) 500 MG 12 hr tablet TAKE 1 TABLET EVERY 12 HOURS. 60 tablet 0  . rosuvastatin (CRESTOR) 10 MG tablet Take 1 tablet (10 mg total) by mouth daily. 30 tablet 6  . triamcinolone cream (KENALOG) 0.1 % Apply 1 application topically daily as needed (rash).      No current facility-administered medications for this visit.   Allergies:  Patient has no known allergies.   Social History: The patient  reports that he quit smoking about 26 years ago. His smoking use included cigarettes. He has a 80.00 pack-year smoking history. He has never used smokeless tobacco. He reports that he does not drink alcohol and does not use drugs.   Family History: The patient's family history includes CAD in an other family member; Hypertension in an other family member.   ROS:  Please see the history of present illness. Otherwise, complete review of systems is positive for none.  All other systems are reviewed and negative.   Physical Exam: VS:  BP (!) 122/54   Pulse 63   Ht 5' 5"  (1.651 m)   Wt 154 lb (69.9 kg)   SpO2 98%   BMI 25.63 kg/m , BMI Body mass index is 25.63 kg/m.  Wt Readings from Last 3 Encounters:  11/30/19 154 lb (69.9 kg)    10/29/19 160 lb 6.4 oz (72.8 kg)  10/15/19 163 lb (73.9 kg)    General: Patient appears comfortable at rest. Neck: Supple, no elevated JVP or carotid bruits, no thyromegaly. Lungs: Clear to auscultation, nonlabored breathing at rest. Cardiac: Regular rate and rhythm, no S3 or significant systolic murmur, no pericardial rub. Extremities: No pitting edema,  distal pulses 2+. Skin: Warm and dry. Musculoskeletal: No kyphosis. Neuropsychiatric: Alert and oriented x3, affect grossly appropriate.  ECG:  An ECG dated 10/29/2019 was personally reviewed today and demonstrated:  Normal sinus rhythm rate of 60, possible anterior infarct, age undetermined.  Recent Labwork: 09/09/2019: Platelets 204 09/21/2019: Hemoglobin 10.9 10/15/2019: ALT 30; AST 30; BUN 20; Creatinine, Ser 1.83; Potassium 4.7; Sodium 141; TSH 1.476     Component Value Date/Time   CHOL 152 06/25/2011 0400   TRIG 150 (H) 06/25/2011 0400   HDL 28 (L) 06/25/2011 0400   CHOLHDL 5.4 06/25/2011 0400   VLDL 30 06/25/2011 0400   LDLCALC 94 06/25/2011 0400    Other Studies Reviewed Today:  Cardiac catheterization 05/12/2017   Prox RCA lesion is 100% stenosed.  Dist LM to Ost LAD lesion is 100% stenosed.  LIMA graft was visualized by angiography and is normal in caliber.  Dist Graft to Insertion lesion is 20% stenosed.  Mid LAD lesion is 30% stenosed.  Prox Cx to Mid Cx lesion is 50% stenosed.  Ost 2nd Mrg to 2nd Mrg lesion is 50% stenosed.  Ost 3rd Mrg lesion is 100% stenosed.  Origin lesion is 100% stenosed.  Origin lesion is 100% stenosed.  SVG graft was not visualized.  SVG graft was not visualized.   1. 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 Presumed that the vein grafts supplied the RCA and OM branches and have been chronically occluded.  2. The ostial LAD is chronically occluded. The LIMA graft to the LAD is patent. The stented segment of the graft at  the anastomosis to the LAD is patent with minimal restenosis. The mid LAD stent just beyond the anastomosis is patent with minimal restenosis.  3. The Circumflex artery is patent with moderate diffuse disease in the proximal and mid vessel. There are several small caliber OM branches with diffuse severe disease. The third Om branch has moderate non-obstructive disease. The 4th OM branch is now occluded. This is a new finding. This branch fills from left to left collaterals.  4. The RCA is chronically occluded in the proximal segment. The vein graft to the PDA is occluded. The mid and distal RCA fills briskly from left to right collaterals.  5. Mild elevation of LV filling pressure  Recommendations: Continue medical management of CAD. He has diffuse disease with no focal targets for PCI.    Carotid artery duplex study 02/11/2019  Right Carotid: Velocities in the right ICA are consistent with a 40-59% stenosis. Left Carotid: Velocities in the left ICA are consistent with a 40-59% stenosis. Vertebrals: Bilateral vertebral arteries demonstrate antegrade flow. Subclavians: Normal flow hemodynamics were seen in bilateral subclavian arteries  . Assessment and Plan:   1. New onset a-fib [facility]) Recent cardioversion on 10/03/2019.  Patient stated he felt good until approximately 4 days prior to visit and started feeling significantly tired and short of breath.  He was back in atrial fibrillation and was sent back to A. fib clinic where he was started on Hunter Townsend.  Plans were to schedule a cardioversion after loading him with Hunter Townsend.  He converted to normal sinus rhythm which he remains in at last visit with me.  Continue Hunter Townsend 200 mg daily.  Continue Eliquis 2.5 mg p.o. twice daily  2. CAD in native artery He has severe triple-vessel disease with previous CABG.  No targets for PCI.  Medical therapy to continue.  He denies any anginal  symptoms currently.  Continue aspirin 81, sublingual  nitroglycerin as needed mg daily, carvedilol 3.125 mg twice daily.  . 3. Bilateral carotid bruits Recent carotid artery duplex study showed 40 to 59% stenosis in both left and right ICAs.  He has a scheduled repeat carotid duplex study in September of this year.  4. Ischemic cardiomyopathy Last echo and October 7473 with systolic function was mildly reduced with estimated EF of approximately 45%, regional wall motion abnormality akinesis of the mid anterior, mid anteroseptal, basal inferior septal and mid anterolateral myocardium, severe hypokinesis of the basal anteroseptal myocardium, moderate hypokinesis of the apical septal myocardium mild hypokinesis of the mid inferior septal and apical myocardium.  Trivial AR, moderate MR moderate TR.  5. Mixed hyperlipidemia Continue Crestor to 10 mg daily   6.  Hypotension and bradycardia. He presents today with improved blood pressure of 122/54 since decreasing dose of carvedilol down to 3.125 mg p.o. twice daily.  States fatigue and low energy has improved.  Heart rate still in the 63 range.  Medication Adjustments/Labs and Tests Ordered: Current medicines are reviewed at length with the patient today.  Concerns regarding medicines are outlined above.   Disposition: Follow-up with [facility] or APP 6 months.  Signed, Levell July, NP 11/30/2019 10:46 AM    [facility] [place], [place], [place] [address] Phone: [phone]; Fax: [phone]

## 2019-11-30 ENCOUNTER — Other Ambulatory Visit: Payer: Self-pay

## 2019-11-30 ENCOUNTER — Encounter: Payer: Self-pay | Admitting: Family Medicine

## 2019-11-30 ENCOUNTER — Ambulatory Visit (INDEPENDENT_AMBULATORY_CARE_PROVIDER_SITE_OTHER): Payer: Medicare Other | Admitting: Family Medicine

## 2019-11-30 VITALS — BP 122/54 | HR 63 | Ht 65.0 in | Wt 154.0 lb

## 2019-11-30 DIAGNOSIS — I4891 Unspecified atrial fibrillation: Secondary | ICD-10-CM | POA: Diagnosis not present

## 2019-11-30 DIAGNOSIS — R0989 Other specified symptoms and signs involving the circulatory and respiratory systems: Secondary | ICD-10-CM

## 2019-11-30 DIAGNOSIS — I255 Ischemic cardiomyopathy: Secondary | ICD-10-CM | POA: Diagnosis not present

## 2019-11-30 DIAGNOSIS — I952 Hypotension due to drugs: Secondary | ICD-10-CM | POA: Diagnosis not present

## 2019-11-30 DIAGNOSIS — I2510 Atherosclerotic heart disease of native coronary artery without angina pectoris: Secondary | ICD-10-CM

## 2019-11-30 DIAGNOSIS — M79672 Pain in left foot: Secondary | ICD-10-CM | POA: Diagnosis not present

## 2019-11-30 DIAGNOSIS — M79671 Pain in right foot: Secondary | ICD-10-CM | POA: Diagnosis not present

## 2019-11-30 DIAGNOSIS — M25579 Pain in unspecified ankle and joints of unspecified foot: Secondary | ICD-10-CM | POA: Diagnosis not present

## 2019-11-30 NOTE — Patient Instructions (Addendum)

## 2019-12-03 DIAGNOSIS — I6523 Occlusion and stenosis of bilateral carotid arteries: Secondary | ICD-10-CM | POA: Diagnosis not present

## 2019-12-03 DIAGNOSIS — I6501 Occlusion and stenosis of right vertebral artery: Secondary | ICD-10-CM | POA: Diagnosis not present

## 2019-12-03 DIAGNOSIS — I10 Essential (primary) hypertension: Secondary | ICD-10-CM | POA: Diagnosis not present

## 2019-12-07 ENCOUNTER — Other Ambulatory Visit: Payer: Self-pay | Admitting: Cardiology

## 2019-12-14 DIAGNOSIS — I2510 Atherosclerotic heart disease of native coronary artery without angina pectoris: Secondary | ICD-10-CM | POA: Diagnosis not present

## 2019-12-14 DIAGNOSIS — Z955 Presence of coronary angioplasty implant and graft: Secondary | ICD-10-CM | POA: Diagnosis not present

## 2019-12-14 DIAGNOSIS — H93A2 Pulsatile tinnitus, left ear: Secondary | ICD-10-CM | POA: Diagnosis not present

## 2019-12-14 DIAGNOSIS — I6523 Occlusion and stenosis of bilateral carotid arteries: Secondary | ICD-10-CM | POA: Diagnosis not present

## 2019-12-14 DIAGNOSIS — I255 Ischemic cardiomyopathy: Secondary | ICD-10-CM | POA: Diagnosis not present

## 2019-12-16 DIAGNOSIS — I081 Rheumatic disorders of both mitral and tricuspid valves: Secondary | ICD-10-CM | POA: Diagnosis not present

## 2019-12-16 DIAGNOSIS — R0602 Shortness of breath: Secondary | ICD-10-CM | POA: Diagnosis not present

## 2019-12-16 DIAGNOSIS — I361 Nonrheumatic tricuspid (valve) insufficiency: Secondary | ICD-10-CM | POA: Diagnosis not present

## 2019-12-16 DIAGNOSIS — I340 Nonrheumatic mitral (valve) insufficiency: Secondary | ICD-10-CM | POA: Diagnosis not present

## 2019-12-16 DIAGNOSIS — I272 Pulmonary hypertension, unspecified: Secondary | ICD-10-CM | POA: Diagnosis not present

## 2019-12-16 DIAGNOSIS — I351 Nonrheumatic aortic (valve) insufficiency: Secondary | ICD-10-CM | POA: Diagnosis not present

## 2019-12-16 DIAGNOSIS — Q211 Atrial septal defect: Secondary | ICD-10-CM | POA: Diagnosis not present

## 2019-12-16 DIAGNOSIS — I4891 Unspecified atrial fibrillation: Secondary | ICD-10-CM | POA: Diagnosis not present

## 2019-12-18 ENCOUNTER — Other Ambulatory Visit: Payer: Self-pay | Admitting: Cardiology

## 2019-12-19 ENCOUNTER — Other Ambulatory Visit: Payer: Self-pay

## 2019-12-19 ENCOUNTER — Emergency Department (HOSPITAL_COMMUNITY): Payer: Medicare Other

## 2019-12-19 ENCOUNTER — Emergency Department (HOSPITAL_COMMUNITY)
Admission: EM | Admit: 2019-12-19 | Discharge: 2019-12-20 | Disposition: A | Discharge status: Home / Self Care | Payer: Medicare Other | Attending: Emergency Medicine | Admitting: Emergency Medicine

## 2019-12-19 DIAGNOSIS — Z87891 Personal history of nicotine dependence: Secondary | ICD-10-CM | POA: Diagnosis not present

## 2019-12-19 DIAGNOSIS — K219 Gastro-esophageal reflux disease without esophagitis: Secondary | ICD-10-CM | POA: Insufficient documentation

## 2019-12-19 DIAGNOSIS — M549 Dorsalgia, unspecified: Secondary | ICD-10-CM | POA: Insufficient documentation

## 2019-12-19 DIAGNOSIS — I2510 Atherosclerotic heart disease of native coronary artery without angina pectoris: Secondary | ICD-10-CM | POA: Diagnosis not present

## 2019-12-19 DIAGNOSIS — R6889 Other general symptoms and signs: Secondary | ICD-10-CM | POA: Diagnosis not present

## 2019-12-19 DIAGNOSIS — I5020 Unspecified systolic (congestive) heart failure: Secondary | ICD-10-CM | POA: Diagnosis not present

## 2019-12-19 DIAGNOSIS — I517 Cardiomegaly: Secondary | ICD-10-CM | POA: Diagnosis not present

## 2019-12-19 DIAGNOSIS — Z791 Long term (current) use of non-steroidal anti-inflammatories (NSAID): Secondary | ICD-10-CM | POA: Diagnosis not present

## 2019-12-19 DIAGNOSIS — R55 Syncope and collapse: Secondary | ICD-10-CM | POA: Diagnosis not present

## 2019-12-19 DIAGNOSIS — I509 Heart failure, unspecified: Secondary | ICD-10-CM | POA: Diagnosis not present

## 2019-12-19 DIAGNOSIS — M542 Cervicalgia: Secondary | ICD-10-CM | POA: Insufficient documentation

## 2019-12-19 DIAGNOSIS — S0990XA Unspecified injury of head, initial encounter: Secondary | ICD-10-CM | POA: Diagnosis not present

## 2019-12-19 DIAGNOSIS — I110 Hypertensive heart disease with heart failure: Secondary | ICD-10-CM | POA: Diagnosis not present

## 2019-12-19 DIAGNOSIS — I255 Ischemic cardiomyopathy: Secondary | ICD-10-CM | POA: Insufficient documentation

## 2019-12-19 DIAGNOSIS — S199XXA Unspecified injury of neck, initial encounter: Secondary | ICD-10-CM | POA: Diagnosis not present

## 2019-12-19 DIAGNOSIS — W19XXXA Unspecified fall, initial encounter: Secondary | ICD-10-CM | POA: Diagnosis not present

## 2019-12-19 DIAGNOSIS — R21 Rash and other nonspecific skin eruption: Secondary | ICD-10-CM | POA: Diagnosis not present

## 2019-12-19 DIAGNOSIS — J189 Pneumonia, unspecified organism: Secondary | ICD-10-CM | POA: Diagnosis not present

## 2019-12-19 DIAGNOSIS — R42 Dizziness and giddiness: Secondary | ICD-10-CM | POA: Insufficient documentation

## 2019-12-19 DIAGNOSIS — I4811 Longstanding persistent atrial fibrillation: Secondary | ICD-10-CM | POA: Insufficient documentation

## 2019-12-19 DIAGNOSIS — Z743 Need for continuous supervision: Secondary | ICD-10-CM | POA: Diagnosis not present

## 2019-12-19 LAB — COMPREHENSIVE METABOLIC PANEL
ALT: 19 U/L (ref 0–44)
AST: 24 U/L (ref 15–41)
Albumin: 3.7 g/dL (ref 3.5–5.0)
Alkaline Phosphatase: 98 U/L (ref 38–126)
Anion gap: 8 (ref 5–15)
BUN: 32 mg/dL — ABNORMAL HIGH (ref 8–23)
CO2: 23 mmol/L (ref 22–32)
Calcium: 8.9 mg/dL (ref 8.9–10.3)
Chloride: 106 mmol/L (ref 98–111)
Creatinine, Ser: 2.16 mg/dL — ABNORMAL HIGH (ref 0.61–1.24)
GFR calc Af Amer: 32 mL/min — ABNORMAL LOW (ref >60)
GFR calc non Af Amer: 28 mL/min — ABNORMAL LOW (ref >60)
Glucose, Bld: 114 mg/dL — ABNORMAL HIGH (ref 70–99)
Potassium: 4.3 mmol/L (ref 3.5–5.1)
Sodium: 137 mmol/L (ref 135–145)
Total Bilirubin: 0.9 mg/dL (ref 0.3–1.2)
Total Protein: 6.7 g/dL (ref 6.5–8.1)

## 2019-12-19 LAB — CBC WITH DIFFERENTIAL/PLATELET
Abs Immature Granulocytes: 0.08 10*3/uL — ABNORMAL HIGH (ref 0.00–0.07)
Basophils Absolute: 0 10*3/uL (ref 0.0–0.1)
Basophils Relative: 0 %
Eosinophils Absolute: 0.2 10*3/uL (ref 0.0–0.5)
Eosinophils Relative: 3 %
HCT: 30.9 % — ABNORMAL LOW (ref 39.0–52.0)
Hemoglobin: 9.9 g/dL — ABNORMAL LOW (ref 13.0–17.0)
Immature Granulocytes: 1 %
Lymphocytes Relative: 31 %
Lymphs Abs: 1.7 10*3/uL (ref 0.7–4.0)
MCH: 30.7 pg (ref 26.0–34.0)
MCHC: 32 g/dL (ref 30.0–36.0)
MCV: 96 fL (ref 80.0–100.0)
Monocytes Absolute: 0.5 10*3/uL (ref 0.1–1.0)
Monocytes Relative: 8 %
Neutro Abs: 3.2 10*3/uL (ref 1.7–7.7)
Neutrophils Relative %: 57 %
Platelets: 230 10*3/uL (ref 150–400)
RBC: 3.22 MIL/uL — ABNORMAL LOW (ref 4.22–5.81)
RDW: 13.4 % (ref 11.5–15.5)
WBC: 5.6 10*3/uL (ref 4.0–10.5)
nRBC: 0 % (ref 0.0–0.2)

## 2019-12-19 LAB — TROPONIN I (HIGH SENSITIVITY)
Troponin I (High Sensitivity): 15 ng/L (ref <18)
Troponin I (High Sensitivity): 16 ng/L (ref <18)

## 2019-12-19 LAB — PROTIME-INR
INR: 1.7 — ABNORMAL HIGH (ref 0.8–1.2)
Prothrombin Time: 19 seconds — ABNORMAL HIGH (ref 11.4–15.2)

## 2019-12-19 MED ORDER — SODIUM CHLORIDE 0.9 % IV BOLUS
1000.0000 mL | Freq: Once | INTRAVENOUS | Status: AC
  Administered 2019-12-19: 1000 mL via INTRAVENOUS

## 2019-12-19 MED ORDER — ACETAMINOPHEN 500 MG PO TABS
1000.0000 mg | ORAL_TABLET | Freq: Once | ORAL | Status: AC
  Administered 2019-12-19: 1000 mg via ORAL
  Filled 2019-12-19: qty 2

## 2019-12-19 MED ORDER — SODIUM CHLORIDE 0.9 % IV SOLN: INTRAVENOUS | Status: DC

## 2019-12-19 NOTE — Discharge Instructions (Signed)
Stop your Carvedilol until seen by your cardiologist.

## 2019-12-19 NOTE — ED Provider Notes (Signed)
[facility] EMERGENCY DEPARTMENT Provider Note   CSN: [id number] Arrival date & time: 12/19/19  2053     History Chief Complaint  Patient presents with  . Fall    Hunter Townsend is a 79 y.o. male.  Pt presents to the ED today with a syncopal event.  Pt said he was walking his garbage can to the road, felt dizzy, and fell.  He is unsure if he had a loc.  He did hit his head and is on Eliquis.  Pt said he's not been feeling well for several months.  He has seen cardiology and his carvedilol was decreased to 3.125 mg bid due to low bp.  Pt denies any cp.  He has chronic pain in his back which radiates up to his neck.  He can't have injections b/c he needs to be on Eliquis due to afib.         Past Medical History:  Diagnosis Date  . Arthritis   . BPH (benign prostatic hyperplasia)   . CHF (congestive heart failure) ([place])   . Coronary atherosclerosis of native coronary artery    Multivessel, NSTEMI  s/p PTCA/DES to mid LAD 06/24/11,, known patent LIMA-LAD and occlusion of all vein grafts  . Hypertension   . Ischemic cardiomyopathy    EF 35-40% by cath 06/24/11 (EF 37% by nuclear stress test 06/2010).  . Myocardial infarction ([place]) 2013  . PAD (peripheral artery disease) ([facility])    Status post left SFA stent - Dr. Burt Knack    Patient Active Problem List   Diagnosis Date Noted  . Persistent atrial fibrillation ([place])   . Hoarseness 05/02/2017  . Constipation 05/02/2017  . GERD (gastroesophageal reflux disease) 03/27/2017  . CAD S/P percutaneous coronary angioplasty   . Hx of CABG   . Accelerated hypertension with NYHA class 4 systolic heart failure ([place])   . Peripheral arterial disease ([place]) 05/17/2011  . Essential hypertension, benign 04/05/2011  . Claudication ([place]) 09/05/2010  . Ischemic cardiomyopathy 08/14/2010  . Coronary artery disease involving native coronary artery of native heart with unstable angina pectoris [facility])     Past Surgical History:  Procedure  Laterality Date  . BACK SURGERY     "cut me ~ 1/2inch to take sciatic nerve out of my right leg"  . CARDIAC CATHETERIZATION  03/28/2016  . CARDIAC CATHETERIZATION N/A 03/28/2016   Procedure: Left Heart Cath and Cors/Grafts Angiography;  Surgeon: Leonie Man, MD;  Location: [place] CV LAB;  Service: Cardiovascular;  Laterality: N/A;  . CARDIOVERSION N/A 09/21/2019   Procedure: CARDIOVERSION;  Surgeon: Skeet Latch, MD;  Location: [place];  Service: Cardiovascular;  Laterality: N/A;  . CATARACT EXTRACTION W/ INTRAOCULAR LENS  IMPLANT, BILATERAL    . CORONARY ANGIOPLASTY    . CORONARY ARTERY BYPASS GRAFT  1987   [place], [place] - LIMA to LAD, SVG to RCA presumably  . LEFT HEART CATH AND CORS/GRAFTS ANGIOGRAPHY N/A 05/12/2017   Procedure: LEFT HEART CATH AND CORS/GRAFTS ANGIOGRAPHY;  Surgeon: Burnell Blanks, MD;  Location: [place] CV LAB;  Service: Cardiovascular;  Laterality: N/A;  . LEFT HEART CATHETERIZATION WITH CORONARY/GRAFT ANGIOGRAM  06/24/2011   Procedure: LEFT HEART CATHETERIZATION WITH Beatrix Fetters;  Surgeon: Burnell Blanks, MD;  Location: [facility] CATH LAB;  Service: Cardiovascular;;  . LEFT HEART CATHETERIZATION WITH CORONARY/GRAFT ANGIOGRAM N/A 02/26/2013   Procedure: LEFT HEART CATHETERIZATION WITH Beatrix Fetters;  Surgeon: Blane Ohara, MD;  Location: [facility] CATH LAB;  Service: Cardiovascular;  Laterality:  N/A;  . LOWER EXTREMITY ANGIOGRAM N/A 10/30/2011   Procedure: LOWER EXTREMITY ANGIOGRAM;  Surgeon: Sherren Mocha, MD;  Location: [facility] CATH LAB;  Service: Cardiovascular;  Laterality: N/A;  . PERCUTANEOUS CORONARY STENT INTERVENTION (PCI-S)  02/26/2013   Procedure: PERCUTANEOUS CORONARY STENT INTERVENTION (PCI-S);  Surgeon: Blane Ohara, MD;  Location: [facility] CATH LAB;  Service: Cardiovascular;;       Family History  Problem Relation Age of Onset  . CAD Other        pt. says he is unsure of the relationship  . Hypertension Other     . Colon cancer Neg Hx   . Gastric cancer Neg Hx   . Esophageal cancer Neg Hx     Social History   Tobacco Use  . Smoking status: Former Smoker    Packs/day: 2.00    Years: 40.00    Pack years: 80.00    Types: Cigarettes    Quit date: 05/27/1993    Years since quitting: 26.5  . Smokeless tobacco: Never Used  Substance Use Topics  . Alcohol use: No    Alcohol/week: 0.0 standard drinks  . Drug use: No    Home Medications Prior to Admission medications   Medication Sig Start Date End Date Taking? Authorizing Provider  acetaminophen (TYLENOL) 325 MG tablet Take 650 mg by mouth every 12 (twelve) hours as needed for moderate pain or headache.     [provider]  amiodarone (PACERONE) 200 MG tablet Take 1 tablet (200 mg total) by mouth daily. 11/16/19   Sherran Needs, NP  apixaban (ELIQUIS) 2.5 MG TABS tablet Take 1 tablet (2.5 mg total) by mouth 2 (two) times daily. 08/27/19   Sherran Needs, NP  aspirin EC 81 MG tablet Take 1 tablet (81 mg total) by mouth daily. 01/16/18   Satira Sark, MD  furosemide (LASIX) 40 MG tablet TAKE 1 TABLET ONCE DAILY. 12/07/19   Satira Sark, MD  gabapentin (NEURONTIN) 100 MG capsule Take 100 mg by mouth 3 (three) times daily.     [provider]  LORazepam (ATIVAN) 1 MG tablet Take 1 mg by mouth at bedtime as needed for anxiety (sleep).     [provider]  losartan (COZAAR) 50 MG tablet TAKE 1 TABLET ONCE DAILY. 10/18/19   Satira Sark, MD  Multiple Vitamin (MULTIVITAMIN) tablet Take 1 tablet by mouth daily.    [provider]  nitroGLYCERIN (NITROSTAT) 0.4 MG SL tablet Place 1 tablet (0.4 mg total) under the tongue every 5 (five) minutes x 3 doses as needed for chest pain (if no relief after 3rd dose, proceed to the ED for an evaluation or call 911). For chest pain 07/16/18   Satira Sark, MD  Omega-3 Fatty Acids (FISH OIL) 1000 MG CAPS Take 2,000 mg by mouth daily.    [provider]   OVER THE COUNTER MEDICATION Place 1 application rectally as needed (constipation). Suppository to help with BM with Occasional fleet enema     [provider]  pantoprazole (PROTONIX) 40 MG tablet TAKE 1 TABLET TWICE DAILY BEFORE A MEAL. Patient taking differently: Take 40 mg by mouth daily.  04/30/19   Carlis Stable, NP  potassium chloride (KLOR-CON) 10 MEQ tablet Take 10 mEq by mouth daily.     [provider]  ranolazine (RANEXA) 500 MG 12 hr tablet TAKE 1 TABLET EVERY 12 HOURS. 09/17/19   Satira Sark, MD  rosuvastatin (CRESTOR) 10 MG tablet Take  1 tablet (10 mg total) by mouth daily. 10/07/19 01/05/20  Verta Ellen., NP  triamcinolone cream (KENALOG) 0.1 % Apply 1 application topically daily as needed (rash).  07/23/19   [provider]  carvedilol (COREG) 3.125 MG tablet Take 1 tablet (3.125 mg total) by mouth 2 (two) times daily. 10/29/19 12/19/19  Verta Ellen., NP    Allergies    Patient has no known allergies.  Review of Systems   Review of Systems  Musculoskeletal: Positive for back pain and neck pain.  Neurological: Positive for dizziness and syncope.  All other systems reviewed and are negative.   Physical Exam Updated Vital Signs BP (!) 159/77   Pulse 61   Temp 98.6 F (37 C)   Resp 17   Ht 5\' 5"  (1.651 m)   Wt 69.6 kg   SpO2 99%   BMI 25.53 kg/m   Physical Exam Vitals and nursing note reviewed.  Constitutional:      Appearance: Normal appearance.  HENT:     Head: Normocephalic and atraumatic.     Right Ear: External ear normal.     Left Ear: External ear normal.     Nose: Nose normal.     Mouth/Throat:     Mouth: Mucous membranes are moist.     Pharynx: Oropharynx is clear.  Eyes:     Extraocular Movements: Extraocular movements intact.     Conjunctiva/sclera: Conjunctivae normal.     Pupils: Pupils are equal, round, and reactive to light.  Neck:     Comments: c-collar Cardiovascular:     Rate and Rhythm: Normal  rate and regular rhythm.     Pulses: Normal pulses.     Heart sounds: Normal heart sounds.  Pulmonary:     Effort: Pulmonary effort is normal.     Breath sounds: Normal breath sounds.  Abdominal:     General: Abdomen is flat. Bowel sounds are normal.     Palpations: Abdomen is soft.  Musculoskeletal:        General: Normal range of motion.  Skin:    General: Skin is warm.     Capillary Refill: Capillary refill takes less than 2 seconds.  Neurological:     General: No focal deficit present.     Mental Status: He is alert and oriented to person, place, and time.  Psychiatric:        Mood and Affect: Mood normal.        Behavior: Behavior normal.        Thought Content: Thought content normal.        Judgment: Judgment normal.     ED Results / Procedures / Treatments   Labs (all labs ordered are listed, but only abnormal results are displayed) Labs Reviewed  CBC WITH DIFFERENTIAL/PLATELET - Abnormal; Notable for the following components:      Result Value   RBC 3.22 (*)    Hemoglobin 9.9 (*)    HCT 30.9 (*)    Abs Immature Granulocytes 0.08 (*)    All other components within normal limits  COMPREHENSIVE METABOLIC PANEL - Abnormal; Notable for the following components:   Glucose, Bld 114 (*)    BUN 32 (*)    Creatinine, Ser 2.16 (*)    GFR calc non Af Amer 28 (*)    GFR calc Af Amer 32 (*)    All other components within normal limits  PROTIME-INR - Abnormal; Notable for the following components:   Prothrombin Time 19.0 (*)  INR 1.7 (*)    All other components within normal limits  URINALYSIS, ROUTINE W REFLEX MICROSCOPIC  CBG MONITORING, ED  TROPONIN I (HIGH SENSITIVITY)  TROPONIN I (HIGH SENSITIVITY)    EKG None  Radiology DG Chest 2 View  Result Date: 12/19/2019 CLINICAL DATA:  79 year old male with syncope. EXAM: CHEST - 2 VIEW COMPARISON:  Chest radiograph dated 03/28/2016. FINDINGS: There is cardiomegaly with vascular congestion and edema. Pneumonia is  not excluded clinical correlation is recommended. No lobar consolidation, pleural effusion, or pneumothorax. There is atherosclerotic calcification of the aorta. Median sternotomy wires and CABG vascular clips. No acute osseous pathology. IMPRESSION: Cardiomegaly with findings of CHF. Electronically Signed   By: Anner Crete M.D.   On: 12/19/2019 23:52   CT HEAD WO CONTRAST  Result Date: 12/19/2019 CLINICAL DATA:  Fall EXAM: CT HEAD WITHOUT CONTRAST CT CERVICAL SPINE WITHOUT CONTRAST TECHNIQUE: Multidetector CT imaging of the head and cervical spine was performed following the standard protocol without intravenous contrast. Multiplanar CT image reconstructions of the cervical spine were also generated. COMPARISON:  None. FINDINGS: CT HEAD FINDINGS Brain: There is no mass, hemorrhage or extra-axial collection. The size and configuration of the ventricles and extra-axial CSF spaces are normal. Areas of hypoattenuation of the deep gray nuclei and confluent periventricular white matter hypodensity, consistent with chronic small vessel disease. Vascular: Atherosclerotic calcification of the vertebral and internal carotid arteries at the skull base. No abnormal hyperdensity of the major intracranial arteries or dural venous sinuses. Skull: The visualized skull base, calvarium and extracranial soft tissues are normal. Sinuses/Orbits: No fluid levels or advanced mucosal thickening of the visualized paranasal sinuses. No mastoid or middle ear effusion. The orbits are normal. CT CERVICAL SPINE FINDINGS Alignment: No static subluxation. Facets are aligned. Occipital condyles are normally positioned. Skull base and vertebrae: No acute fracture. Soft tissues and spinal canal: No prevertebral fluid or swelling. No visible canal hematoma. Disc levels: Degenerative disc disease is greatest at C5-6. There is multilevel facet arthrosis worse. Upper chest: No pneumothorax, pulmonary nodule or pleural effusion. Other: Normal  visualized paraspinal cervical soft tissues. IMPRESSION: 1. 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 2. No acute fracture or static subluxation of the cervical spine. Electronically Signed   By: Ulyses Jarred M.D.   On: 12/19/2019 23:52   CT CERVICAL SPINE WO CONTRAST  Result Date: 12/19/2019 CLINICAL DATA:  Fall EXAM: CT HEAD WITHOUT CONTRAST CT CERVICAL SPINE WITHOUT CONTRAST TECHNIQUE: Multidetector CT imaging of the head and cervical spine was performed following the standard protocol without intravenous contrast. Multiplanar CT image reconstructions of the cervical spine were also generated. COMPARISON:  None. FINDINGS: CT HEAD FINDINGS Brain: There is no mass, hemorrhage or extra-axial collection. The size and configuration of the ventricles and extra-axial CSF spaces are normal. Areas of hypoattenuation of the deep gray nuclei and confluent periventricular white matter hypodensity, consistent with chronic small vessel disease. Vascular: Atherosclerotic calcification of the vertebral and internal carotid arteries at the skull base. No abnormal hyperdensity of the major intracranial arteries or dural venous sinuses. Skull: The visualized skull base, calvarium and extracranial soft tissues are normal. Sinuses/Orbits: No fluid levels or advanced mucosal thickening of the visualized paranasal sinuses. No mastoid or middle ear effusion. The orbits are normal. CT CERVICAL SPINE FINDINGS Alignment: No static subluxation. Facets are aligned. Occipital condyles are normally positioned. Skull base and vertebrae: No acute fracture. Soft tissues and spinal canal: No prevertebral fluid or swelling. No visible canal hematoma. Disc levels:  Degenerative disc disease is greatest at C5-6. There is multilevel facet arthrosis worse. Upper chest: No pneumothorax, pulmonary nodule or pleural effusion. Other: Normal visualized paraspinal cervical soft tissues. IMPRESSION: 1. Chronic small vessel  disease without acute intracranial abnormality. 2. No acute fracture or static subluxation of the cervical spine. Electronically Signed   By: Ulyses Jarred M.D.   On: 12/19/2019 23:52    Procedures Procedures (including critical care time)  Medications Ordered in ED Medications  sodium chloride 0.9 % bolus 1,000 mL (1,000 mLs Intravenous New Bag/Given 12/19/19 2244)    And  0.9 %  sodium chloride infusion (has no administration in time range)  acetaminophen (TYLENOL) tablet 1,000 mg (1,000 mg Oral Given 12/19/19 2245)    ED Course  I have reviewed the triage vital signs and the nursing notes.  Pertinent labs & imaging results that were available during my care of the patient were reviewed by me and considered in my medical decision making (see chart for details).    MDM Rules/Calculators/A&P                          Pt given IVFs.  Labs look chronic.  He is feeling better.  I am going to d/c his Coreg.  Pt is stable for d/c.  Return if worse.  Final Clinical Impression(s) / ED Diagnoses Final diagnoses:  Near syncope  Fall, initial encounter    Rx / DC Orders ED Discharge Orders    None       Isla Pence, MD 12/19/19 2358

## 2019-12-19 NOTE — ED Triage Notes (Signed)
Pt arrives from home via ems. Complaints of fall. States he was taking his garbage can to the road when he became dizzy and fell. No LOC. Alert and oriented. Only complaints of neck pain; said he does have disc problems .

## 2019-12-20 ENCOUNTER — Other Ambulatory Visit: Payer: Self-pay

## 2019-12-20 MED ORDER — RANOLAZINE ER 500 MG PO TB12: 500.0000 mg | ORAL_TABLET | Freq: Two times a day (BID) | ORAL | 3 refills | Status: DC

## 2019-12-23 ENCOUNTER — Other Ambulatory Visit (HOSPITAL_COMMUNITY): Payer: Self-pay | Admitting: Nurse Practitioner

## 2019-12-27 NOTE — Progress Notes (Signed)
Cardiology Office Note  Date: 12/28/2019   ID: IZAIAS Townsend, DOB 10-10-1940, MRN [id number]  PCP:  Hunter Labrum, MD  Cardiologist:  Hunter Lesches, MD Electrophysiologist:  None   Chief Complaint: Follow-up atrial fibrillation, CAD, bilateral carotid bruit, ischemic cardiomyopathy, mixed hyperlipidemia.  History of Present Illness: Hunter Townsend is a 79 y.o. male with a history of atrial fibrillation, CAD, bilateral carotid bruit, ischemic cardiomyopathy, mixed hyperlipidemia.  Last encounter was Dr. Domenic Townsend on 02/08/2019 for routine office visit.  At that time he reported no active angina and clinically stable is current medical therapy.  He was continuing DAPT therapy.  Carotid Dopplers were ordered for bilateral carotid bruits.  His most recent EF was 45% and undergoing conservative management.  Activity limited by chronic in back and neck pain.  Still attempting to go walking most days.  Was having pain injections.  He was doing well on Ranexa in terms of his anginal control.  Recent presentation to [facility] emergency room was found to be in new onset atrial fibrillation.  He had recently received his Covid vaccination on 08/07/2019.  He was rate controlled in ER and not placed on anticoagulation.  He was seen at [facility] atrial fibrillation clinic on 08/26/2019 by Hunter Palau, NP.  His rate was controlled at atrial fibrillation clinic he was continue his carvedilol 1/2 tablets of 12.5 mg twice daily.  Mrs. Hunter Townsend sent epic message to Dr. Domenic Townsend.  Plavix was stopped.  Eliquis was started 2.5 mg p.o. twice daily (age greater than 49, creatinine 1.84, weight 153.  Ms. Hunter Townsend subsequently followed up on 09/09/2019.  Patient had been anticoagulated x2 weeks without any missed doses.  He wanted to proceed with cardioversion after 3 weeks of uninterrupted anticoagulation. His CHA2DS2-VASc was 5.  He underwent DC cardioversion 09/21/2019 Dr. Oval Townsend.  Cardioverted 1 time at 150 J.   Post procedure EKG showed sinus frequent PACs.  Patient tolerated procedure well. He eventually reverted back to atrial fibrillation with associated SOB and significant fatigue and activity intolerance. He admitted to some dizziness.  He was referred back to atrial flutter clinic and seen on 10/15/2019.  Amiodarone 200 mg p.o. twice daily was ordered x4 weeks then set up for cardioversion with assessment of patient ensuring he had not missed any of his anticoagulation for 3 weeks prior to cardioversion.  Plans were to continue amiodarone 200 twice daily until he saw cardiology back 1 week after cardioversion and  in sinus rhythm reduce Amiodarone to 200 mg daily.  Carvedilol was reduced to 12.5 mg twice daily.  He was to have an EKG 1 week to assess QTC and rhythm.  He was to come back in 2 weeks to get cardioversion schedule.  He would need TSH and c-Met every 6 months as well as chest x-ray or PFTs every year for surveillance of amiodarone.  Follow-up EKG on 10/21/2019 showed normal sinus rhythm rate of 65 QT 446 ms QTC 464.  Diffuse nonspecific T wave abnormality.  Saw me at last visit stating he just felt "crummy".  Stated he had no energy.  His blood pressure was 96/52 and heart rate 60.  Denied any palpitations or arrhythmias, orthostatic symptoms, CVA or TIA-like symptoms, bleeding in stool or urine.  Denied any PND, orthopnea, or lower extremity edema. He was back in a normal sinus rhythm after starting amiodarone.  He was originally slated for cardioversion after loading with amiodarone. He continued to load with amiodarone until June 21st  at which point he decreased  his amiodarone dose down to 200 mg daily only.  Presented at last visit with no complaints of feeling "crummy" as he did at last visit.  We decreased his carvedilol down to 3.125 mg p.o. twice daily due to low blood pressure of 96/52 at last visit.  Blood pressure today is 122/54 with a heart rate of 63.  He stated the reason he felt  dizziness was due to issues with his cervical spine and causing so much pain he felt dizzy but no frank syncopal episodes.  Stated he had been receiving joint injections in these areas for pain relief by orthopedics.  Stated he had been having some recent issues with decreased appetite.  Presented to [facility] emergency room on 12/19/2019 with syncopal event.  He stated he was walking his garbage can into the road and felt dizzy, then fell.  He was unsure if he had loss of consciousness.  He did hit his head.  He was given IV fluids, he described feeling better.  His carvedilol was discontinued.  He was stable for discharge.  Hemoglobin 9.9, hematocrit 30.9, glucose 114, BUN 32, creatinine 2.16, GFR 28, INR 1.7 troponins were negative x2.    Patient presents today continued complaint of dizziness and orthostatic changes.  He states no one ever told him to DC his carvedilol at the [facility] emergency room visit.  He states he just feels terrible.  He denies any anginal or exertional symptoms, stroke or TIA-like symptoms.  Continues with orthostatic symptoms.  He states during the syncopal episode he does not recall any prodromal symptoms leading up to the syncopal episodes.  He states he had no warning.  He states the next thing he remembered was waking up on the ground.  He states sometimes he feels like his heart is fluttering or racing.  He denies any PND orthopnea or bleeding issues.  No claudication-like symptoms, DVT or PE-like symptoms, lower extremity edema.  Blood pressure today 116/58 with a pulse rate of 53.   Past Medical History:  Diagnosis Date  . Arthritis   . BPH (benign prostatic hyperplasia)   . CHF (congestive heart failure) ([place])   . Coronary atherosclerosis of native coronary artery    Multivessel, NSTEMI  s/p PTCA/DES to mid LAD 06/24/11,, known patent LIMA-LAD and occlusion of all vein grafts  . Hypertension   . Ischemic cardiomyopathy    EF 35-40% by cath 06/24/11 (EF 37% by  nuclear stress test 06/2010).  . Myocardial infarction ([place]) 2013  . PAD (peripheral artery disease) ([facility])    Status post left SFA stent - Dr. Burt Knack    Past Surgical History:  Procedure Laterality Date  . BACK SURGERY     "cut me ~ 1/2inch to take sciatic nerve out of my right leg"  . CARDIAC CATHETERIZATION  03/28/2016  . CARDIAC CATHETERIZATION N/A 03/28/2016   Procedure: Left Heart Cath and Cors/Grafts Angiography;  Surgeon: Leonie Man, MD;  Location: [facility];  Service: Cardiovascular;  Laterality: N/A;  . CARDIOVERSION N/A 09/21/2019   Procedure: CARDIOVERSION;  Surgeon: Skeet Latch, MD;  Location: [place];  Service: Cardiovascular;  Laterality: N/A;  . CATARACT EXTRACTION W/ INTRAOCULAR LENS  IMPLANT, BILATERAL    . CORONARY ANGIOPLASTY    . CORONARY ARTERY BYPASS GRAFT  1987   [place], [place] - LIMA to LAD, SVG to RCA presumably  . LEFT HEART CATH AND CORS/GRAFTS ANGIOGRAPHY N/A 05/12/2017   Procedure: LEFT  HEART CATH AND CORS/GRAFTS ANGIOGRAPHY;  Surgeon: Burnell Blanks, MD;  Location: [place] [facility];  Service: Cardiovascular;  Laterality: N/A;  . LEFT HEART CATHETERIZATION WITH CORONARY/GRAFT ANGIOGRAM  06/24/2011   Procedure: LEFT HEART CATHETERIZATION WITH Beatrix Fetters;  Surgeon: Burnell Blanks, MD;  Location[facility];  Service: Cardiovascular;;  . LEFT HEART CATHETERIZATION WITH CORONARY/GRAFT ANGIOGRAM N/A 02/26/2013   Procedure: LEFT HEART CATHETERIZATION WITH Beatrix Fetters;  Surgeon: Blane Ohara, MD;  Location: [facility] CATH LAB;  Service: Cardiovascular;  Laterality: N/A;  . LOWER EXTREMITY ANGIOGRAM N/A 10/30/2011   Procedure: LOWER EXTREMITY ANGIOGRAM;  Surgeon: Sherren Mocha, MD;  Location: [facility] CATH LAB;  Service: Cardiovascular;  Laterality: N/A;  . PERCUTANEOUS CORONARY STENT INTERVENTION (PCI-S)  02/26/2013   Procedure: PERCUTANEOUS CORONARY STENT INTERVENTION (PCI-S);  Surgeon: Blane Ohara, MD;   Location: [facility] CATH LAB;  Service: Cardiovascular;;    Current Outpatient Medications  Medication Sig Dispense Refill  . acetaminophen (TYLENOL) 325 MG tablet Take 650 mg by mouth every 12 (twelve) hours as needed for moderate pain or headache.     Marland Kitchen amiodarone (PACERONE) 200 MG tablet Take 1 tablet (200 mg total) by mouth daily. 30 tablet 3  . aspirin EC 81 MG tablet Take 1 tablet (81 mg total) by mouth daily. 90 tablet 3  . ELIQUIS 2.5 MG TABS tablet TAKE (1) TABLET TWICE DAILY. 60 tablet 6  . furosemide (LASIX) 40 MG tablet TAKE 1 TABLET ONCE DAILY. 90 tablet 1  . gabapentin (NEURONTIN) 100 MG capsule Take 100 mg by mouth 3 (three) times daily.     Marland Kitchen LORazepam (ATIVAN) 1 MG tablet Take 1 mg by mouth at bedtime as needed for anxiety (sleep).     . losartan (COZAAR) 25 MG tablet Take 1 tablet (25 mg total) by mouth daily. 30 tablet 6  . Multiple Vitamin (MULTIVITAMIN) tablet Take 1 tablet by mouth daily.    . nitroGLYCERIN (NITROSTAT) 0.4 MG SL tablet Place 1 tablet (0.4 mg total) under the tongue every 5 (five) minutes x 3 doses as needed for chest pain (if no relief after 3rd dose, proceed to the ED for an evaluation or call 911). For chest pain 25 tablet 3  . Omega-3 Fatty Acids (FISH OIL) 1000 MG CAPS Take 2,000 mg by mouth daily.    Marland Kitchen OVER THE COUNTER MEDICATION Place 1 application rectally as needed (constipation). Suppository to help with BM with Occasional fleet enema     . pantoprazole (PROTONIX) 40 MG tablet TAKE 1 TABLET TWICE DAILY BEFORE A MEAL. (Patient taking differently: Take 40 mg by mouth daily. ) 180 tablet 3  . potassium chloride (KLOR-CON) 10 MEQ tablet Take 10 mEq by mouth daily.     . ranolazine (RANEXA) 500 MG 12 hr tablet Take 1 tablet (500 mg total) by mouth every 12 (twelve) hours. 180 tablet 3  . rosuvastatin (CRESTOR) 10 MG tablet Take 1 tablet (10 mg total) by mouth daily. 30 tablet 6  . triamcinolone cream (KENALOG) 0.1 % Apply 1 application topically daily as  needed (rash).      No current facility-administered medications for this visit.   Allergies:  Patient has no known allergies.   Social History: The patient  reports that he quit smoking about 26 years ago. His smoking use included cigarettes. He has a 80.00 pack-year smoking history. He has never used smokeless tobacco. He reports that he does not drink alcohol and does not use drugs.  Family History: The patient's family history includes CAD in an other family member; Hypertension in an other family member.   ROS:  Please see the history of present illness. Otherwise, complete review of systems is positive for none.  All other systems are reviewed and negative.   Physical Exam: VS:  BP (!) 116/58   Pulse (!) 53   Ht 5' 4"  (1.626 m)   Wt 155 lb 12.8 oz (70.7 kg)   SpO2 98%   BMI 26.74 kg/m , BMI Body mass index is 26.74 kg/m.  Wt Readings from Last 3 Encounters:  12/28/19 155 lb 12.8 oz (70.7 kg)  12/19/19 153 lb 6.5 oz (69.6 kg)  11/30/19 154 lb (69.9 kg)    General: Patient appears comfortable at rest. Neck: Supple, no elevated JVP or carotid bruits, no thyromegaly. Lungs: Clear to auscultation, nonlabored breathing at rest. Cardiac: Regular rate and rhythm, no S3 or significant systolic murmur, no pericardial rub. Extremities: No pitting edema, distal pulses 2+. Skin: Warm and dry. Musculoskeletal: No kyphosis. Neuropsychiatric: Alert and oriented x3, affect grossly appropriate.  ECG:  An ECG dated 10/29/2019 was personally reviewed today and demonstrated:  Normal sinus rhythm rate of 60, possible anterior infarct, age undetermined.  Recent Labwork: 10/15/2019: TSH 1.476 12/19/2019: ALT 19; AST 24; BUN 32; Creatinine, Ser 2.16; Hemoglobin 9.9; Platelets 230; Potassium 4.3; Sodium 137     Component Value Date/Time   CHOL 152 06/25/2011 0400   TRIG 150 (H) 06/25/2011 0400   HDL 28 (L) 06/25/2011 0400   CHOLHDL 5.4 06/25/2011 0400   VLDL 30 06/25/2011 0400   LDLCALC 94  06/25/2011 0400    Other Studies Reviewed Today:  Cardiac catheterization 05/12/2017   Prox RCA lesion is 100% stenosed.  Dist LM to Ost LAD lesion is 100% stenosed.  LIMA graft was visualized by angiography and is normal in caliber.  Dist Graft to Insertion lesion is 20% stenosed.  Mid LAD lesion is 30% stenosed.  Prox Cx to Mid Cx lesion is 50% stenosed.  Ost 2nd Mrg to 2nd Mrg lesion is 50% stenosed.  Ost 3rd Mrg lesion is 100% stenosed.  Origin lesion is 100% stenosed.  Origin lesion is 100% stenosed.  SVG graft was not visualized.  SVG graft was not visualized.   1. 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 Presumed that the vein grafts supplied the RCA and OM branches and have been chronically occluded.  2. The ostial LAD is chronically occluded. The LIMA graft to the LAD is patent. The stented segment of the graft at the anastomosis to the LAD is patent with minimal restenosis. The mid LAD stent just beyond the anastomosis is patent with minimal restenosis.  3. The Circumflex artery is patent with moderate diffuse disease in the proximal and mid vessel. There are several small caliber OM branches with diffuse severe disease. The third Om branch has moderate non-obstructive disease. The 4th OM branch is now occluded. This is a new finding. This branch fills from left to left collaterals.  4. The RCA is chronically occluded in the proximal segment. The vein graft to the PDA is occluded. The mid and distal RCA fills briskly from left to right collaterals.  5. Mild elevation of LV filling pressure  Recommendations: Continue medical management of CAD. He has diffuse disease with no focal targets for PCI.    Carotid artery duplex study 02/11/2019  Right Carotid: Velocities in the right  ICA are consistent with a 40-59% stenosis. Left Carotid: Velocities in the left ICA are consistent with a 40-59% stenosis. Vertebrals:  Bilateral vertebral arteries demonstrate antegrade flow. Subclavians: Normal flow hemodynamics were seen in bilateral subclavian arteries  . Assessment and Plan:  1.  Syncope and collapse. Recent syncopal episode while taking his trash can out for trash collection.  States he does not remember any prodromal symptoms such as nausea, dizziness or other precipitating event.  He states he just remembers waking up on the ground.  States he has been feeling some palpitations at home.  Please get a 30-day event monitor   2. New onset a-fib [facility]) Recent cardioversion on 10/03/2019.  Patient stated he felt good until approximately 4 days prior to visit and started feeling significantly tired and short of breath.  He was back in atrial fibrillation and was sent back to A. fib clinic where he was started on amiodarone.  Plans were to schedule a cardioversion after loading him with amiodarone.  He converted to normal sinus rhythm which he remains in at last visit with me.  Continue amiodarone 200 mg daily.  Continue Eliquis 2.5 mg p.o. twice daily.  He admits to occasional palpitations recently.  3. CAD in native artery He has severe triple-vessel disease with previous CABG.  No targets for PCI.  Medical therapy to continue.  He denies any anginal symptoms currently.  Continue aspirin 81, sublingual nitroglycerin as needed mg daily, carvedilol 3.125 mg twice daily.  . 4. Bilateral carotid bruits Recent carotid artery duplex study showed 40 to 59% stenosis in both left and right ICAs.  He has a scheduled repeat carotid duplex study in September of this year.  5. Ischemic cardiomyopathy Last echo and October 6886 with systolic function was mildly reduced with estimated EF of approximately 45%, regional wall motion abnormality akinesis of the mid anterior, mid anteroseptal, basal inferior septal and mid anterolateral myocardium, severe hypokinesis of the basal anteroseptal myocardium, moderate hypokinesis of the  apical septal myocardium mild hypokinesis of the mid inferior septal and apical myocardium.  Trivial AR, moderate MR moderate TR.  6. Mixed hyperlipidemia Continue Crestor to 10 mg daily.  7.  Hypotension and bradycardia. Blood pressure today is 116/58.  Heart rate is 53.  Patient states he was in not told to discontinue his Coreg as indicated on the discharge note from recent hospital visit for syncope.  Advised him to stop his Coreg.  Decrease losartan to 25 mg daily.   Medication Adjustments/Labs and Tests Ordered: Current medicines are reviewed at length with the patient today.  Concerns regarding medicines are outlined above.   Disposition: Follow-up with Dr. Domenic Townsend or APP 6 months.  Signed, Levell July, NP 12/28/2019 1:15 PM    [facility], [place], [place] [address] Phone: [phone]; Fax: [phone]

## 2019-12-28 ENCOUNTER — Encounter: Payer: Self-pay | Admitting: Family Medicine

## 2019-12-28 ENCOUNTER — Telehealth: Payer: Self-pay | Admitting: Family Medicine

## 2019-12-28 ENCOUNTER — Ambulatory Visit (INDEPENDENT_AMBULATORY_CARE_PROVIDER_SITE_OTHER): Payer: Medicare Other | Admitting: Family Medicine

## 2019-12-28 VITALS — BP 116/58 | HR 53 | Ht 64.0 in | Wt 155.8 lb

## 2019-12-28 DIAGNOSIS — I255 Ischemic cardiomyopathy: Secondary | ICD-10-CM

## 2019-12-28 DIAGNOSIS — R55 Syncope and collapse: Secondary | ICD-10-CM | POA: Diagnosis not present

## 2019-12-28 DIAGNOSIS — M79671 Pain in right foot: Secondary | ICD-10-CM | POA: Diagnosis not present

## 2019-12-28 DIAGNOSIS — M79672 Pain in left foot: Secondary | ICD-10-CM | POA: Diagnosis not present

## 2019-12-28 DIAGNOSIS — M25579 Pain in unspecified ankle and joints of unspecified foot: Secondary | ICD-10-CM | POA: Diagnosis not present

## 2019-12-28 DIAGNOSIS — R0989 Other specified symptoms and signs involving the circulatory and respiratory systems: Secondary | ICD-10-CM

## 2019-12-28 DIAGNOSIS — I4891 Unspecified atrial fibrillation: Secondary | ICD-10-CM | POA: Diagnosis not present

## 2019-12-28 DIAGNOSIS — E782 Mixed hyperlipidemia: Secondary | ICD-10-CM

## 2019-12-28 DIAGNOSIS — I2510 Atherosclerotic heart disease of native coronary artery without angina pectoris: Secondary | ICD-10-CM | POA: Diagnosis not present

## 2019-12-28 MED ORDER — LOSARTAN POTASSIUM 25 MG PO TABS: 25.0000 mg | ORAL_TABLET | Freq: Every day | ORAL | 6 refills | Status: DC

## 2019-12-28 NOTE — Telephone Encounter (Signed)
Pre-cert Verification for the following procedure    30 day event monitor - syncope

## 2019-12-28 NOTE — Patient Instructions (Addendum)
Medication Instructions:   Stop Coreg (Carvedilol).  Decrease Losartan to 25mg  daily.  Continue all other medications.    Labwork: none  Testing/Procedures:  Your physician has recommended that you wear a 30 day event monitor. Event monitors are medical devices that record the heart's electrical activity. Doctors most often [place] these monitors to diagnose arrhythmias. Arrhythmias are problems with the speed or rhythm of the heartbeat. The monitor is a small, portable device. You can wear one while you do your normal daily activities. This is usually used to diagnose what is causing palpitations/syncope (passing out).  Office will contact with results via phone or letter.    Follow-Up: 6 weeks   Any Other Special Instructions Will Be Listed Below (If Applicable).  If you need a refill on your cardiac medications before your next appointment, please call your pharmacy.

## 2020-01-20 DIAGNOSIS — R42 Dizziness and giddiness: Secondary | ICD-10-CM | POA: Diagnosis not present

## 2020-01-20 DIAGNOSIS — Z955 Presence of coronary angioplasty implant and graft: Secondary | ICD-10-CM | POA: Diagnosis not present

## 2020-01-20 DIAGNOSIS — I2510 Atherosclerotic heart disease of native coronary artery without angina pectoris: Secondary | ICD-10-CM | POA: Diagnosis not present

## 2020-01-20 DIAGNOSIS — I255 Ischemic cardiomyopathy: Secondary | ICD-10-CM | POA: Diagnosis not present

## 2020-01-20 DIAGNOSIS — Z951 Presence of aortocoronary bypass graft: Secondary | ICD-10-CM | POA: Diagnosis not present

## 2020-01-23 DIAGNOSIS — R55 Syncope and collapse: Secondary | ICD-10-CM | POA: Diagnosis not present

## 2020-01-24 ENCOUNTER — Ambulatory Visit (INDEPENDENT_AMBULATORY_CARE_PROVIDER_SITE_OTHER): Payer: Medicare Other

## 2020-01-24 DIAGNOSIS — R55 Syncope and collapse: Secondary | ICD-10-CM | POA: Diagnosis not present

## 2020-01-25 DIAGNOSIS — M25579 Pain in unspecified ankle and joints of unspecified foot: Secondary | ICD-10-CM | POA: Diagnosis not present

## 2020-01-25 DIAGNOSIS — M79671 Pain in right foot: Secondary | ICD-10-CM | POA: Diagnosis not present

## 2020-01-25 DIAGNOSIS — M79672 Pain in left foot: Secondary | ICD-10-CM | POA: Diagnosis not present

## 2020-02-09 ENCOUNTER — Encounter: Payer: Self-pay | Admitting: Cardiology

## 2020-02-09 NOTE — Progress Notes (Signed)
Cardiology Office Note  Date: 02/10/2020   ID: Hunter Townsend, DOB May 18, 1941, MRN [id number]  PCP:  Curlene Labrum, MD  Cardiologist:  Rozann Lesches, MD Electrophysiologist:  None   Chief Complaint  Patient presents with  . Cardiac follow-up    History of Present Illness: Hunter Townsend is an 79 y.o. male last seen in August by Mr. Leonides Sake NP.  I reviewed interval chart documentation of the last several months.  He presents for a routine visit.  Since last encounter he does not describe any sudden dizziness or syncope, no accelerating angina.  He is limited by significant lower back pain and leg weakness.  He states that this makes it difficult for him to get around in his house, but when he goes to [place] and uses a cart to hold onto, he can walk 10 laps without major limitation.  Cardiac monitor was arranged by Mr. Leonides Sake NP due to patient history of near syncope, also known paroxysmal to persistent atrial fibrillation.  Final report is not yet available, however I was able to review some of the daily summary reports.  Rhythm is sinus with heart rate ranging from 40s to 80s overall.  No definite pauses recorded based on available information.  He is now off Coreg completely.  I reviewed his medications which are listed below.  Also reviewed lab work as noted below.  Past Medical History:  Diagnosis Date  . Arthritis   . Atrial fibrillation ([place])   . BPH (benign prostatic hyperplasia)   . CHF (congestive heart failure) ([place])   . Coronary atherosclerosis of native coronary artery    Multivessel, NSTEMI  s/p PTCA/DES to mid LAD 06/24/11,, known patent LIMA-LAD and occlusion of all vein grafts  . Hypertension   . Ischemic cardiomyopathy    EF 35-40% by cath 06/24/11 (EF 37% by nuclear stress test 06/2010).  . Myocardial infarction ([place]) 2013  . PAD (peripheral artery disease) (HCC)    Status post left SFA stent - Dr. Burt Knack    Past Surgical History:  Procedure  Laterality Date  . BACK SURGERY     "cut me ~ 1/2inch to take sciatic nerve out of my right leg"  . CARDIAC CATHETERIZATION  03/28/2016  . CARDIAC CATHETERIZATION N/A 03/28/2016   Procedure: Left Heart Cath and Cors/Grafts Angiography;  Surgeon: Leonie Man, MD;  Location: [place] [facility];  Service: Cardiovascular;  Laterality: N/A;  . CARDIOVERSION N/A 09/21/2019   Procedure: CARDIOVERSION;  Surgeon: Skeet Latch, MD;  Location: [place];  Service: Cardiovascular;  Laterality: N/A;  . CATARACT EXTRACTION W/ INTRAOCULAR LENS  IMPLANT, BILATERAL    . CORONARY ANGIOPLASTY    . CORONARY ARTERY BYPASS GRAFT  1987   [place] - LIMA to LAD, SVG to RCA presumably  . LEFT HEART CATH AND CORS/GRAFTS ANGIOGRAPHY N/A 05/12/2017   Procedure: LEFT HEART CATH AND CORS/GRAFTS ANGIOGRAPHY;  Surgeon: Burnell Blanks, MD;  Location: [place] [facility];  Service: Cardiovascular;  Laterality: N/A;  . LEFT HEART CATHETERIZATION WITH CORONARY/GRAFT ANGIOGRAM  06/24/2011   Procedure: LEFT HEART CATHETERIZATION WITH Beatrix Fetters;  Surgeon: Burnell Blanks, MD;  Location: [facility] CATH LAB;  Service: Cardiovascular;;  . LEFT HEART CATHETERIZATION WITH CORONARY/GRAFT ANGIOGRAM N/A 02/26/2013   Procedure: LEFT HEART CATHETERIZATION WITH Beatrix Fetters;  Surgeon: Blane Ohara, MD;  Location: [facility] CATH LAB;  Service: Cardiovascular;  Laterality: N/A;  . LOWER EXTREMITY ANGIOGRAM N/A 10/30/2011   Procedure: LOWER EXTREMITY ANGIOGRAM;  Surgeon: Sherren Mocha, MD;  Location: [facility] CATH LAB;  Service: Cardiovascular;  Laterality: N/A;  . PERCUTANEOUS CORONARY STENT INTERVENTION (PCI-S)  02/26/2013   Procedure: PERCUTANEOUS CORONARY STENT INTERVENTION (PCI-S);  Surgeon: Blane Ohara, MD;  Location: [facility] CATH LAB;  Service: Cardiovascular;;    Current Outpatient Medications  Medication Sig Dispense Refill  . acetaminophen (TYLENOL) 325 MG tablet Take 650 mg by mouth every 12  (twelve) hours as needed for moderate pain or headache.     Marland Kitchen amiodarone (PACERONE) 200 MG tablet Take 1 tablet (200 mg total) by mouth daily. 30 tablet 3  . aspirin EC 81 MG tablet Take 1 tablet (81 mg total) by mouth daily. 90 tablet 3  . ELIQUIS 2.5 MG TABS tablet TAKE (1) TABLET TWICE DAILY. 60 tablet 6  . furosemide (LASIX) 40 MG tablet TAKE 1 TABLET ONCE DAILY. 90 tablet 1  . gabapentin (NEURONTIN) 100 MG capsule Take 100 mg by mouth 3 (three) times daily.     Marland Kitchen LORazepam (ATIVAN) 1 MG tablet Take 1 mg by mouth at bedtime as needed for anxiety (sleep).     . losartan (COZAAR) 25 MG tablet Take 1 tablet (25 mg total) by mouth daily. 30 tablet 6  . Multiple Vitamin (MULTIVITAMIN) tablet Take 1 tablet by mouth daily.    . nitroGLYCERIN (NITROSTAT) 0.4 MG SL tablet Place 1 tablet (0.4 mg total) under the tongue every 5 (five) minutes x 3 doses as needed for chest pain (if no relief after 3rd dose, proceed to the ED for an evaluation or call 911). For chest pain 25 tablet 3  . Omega-3 Fatty Acids (FISH OIL) 1000 MG CAPS Take 2,000 mg by mouth daily.    Marland Kitchen OVER THE COUNTER MEDICATION Place 1 application rectally as needed (constipation). Suppository to help with BM with Occasional fleet enema     . pantoprazole (PROTONIX) 40 MG tablet TAKE 1 TABLET TWICE DAILY BEFORE A MEAL. (Patient taking differently: Take 40 mg by mouth daily. ) 180 tablet 3  . potassium chloride (KLOR-CON) 10 MEQ tablet Take 10 mEq by mouth daily.     . ranolazine (RANEXA) 500 MG 12 hr tablet Take 1 tablet (500 mg total) by mouth every 12 (twelve) hours. 180 tablet 3  . triamcinolone cream (KENALOG) 0.1 % Apply 1 application topically daily as needed (rash).     . rosuvastatin (CRESTOR) 10 MG tablet Take 1 tablet (10 mg total) by mouth daily. 30 tablet 6   No current facility-administered medications for this visit.   Allergies:  Patient has no known allergies.   ROS:   Hearing loss.  Chronic back pain.  Physical  Exam: VS:  BP (!) 122/58   Pulse 61   Ht 5\' 5"  (1.651 m)   Wt 152 lb (68.9 kg)   SpO2 96%   BMI 25.29 kg/m , BMI Body mass index is 25.29 kg/m.  Wt Readings from Last 3 Encounters:  02/10/20 152 lb (68.9 kg)  12/28/19 155 lb 12.8 oz (70.7 kg)  12/19/19 153 lb 6.5 oz (69.6 kg)    General: Elderly male, appears comfortable at rest. HEENT: Conjunctiva and lids normal. Neck: Supple, no elevated JVP or carotid bruits, no thyromegaly. Lungs: Clear to auscultation, nonlabored breathing at rest. Cardiac: Regular rate and rhythm, no S3, soft systolic murmur, no pericardial rub. Extremities: No pitting edema, distal pulses 2+.  ECG:  An ECG dated 12/19/2019 was personally reviewed today and demonstrated:  Sinus rhythm with prolonged PR  interval and IVCD.  Recent Labwork: 10/15/2019: TSH 1.476 12/19/2019: ALT 19; AST 24; BUN 32; Creatinine, Ser 2.16; Hemoglobin 9.9; Platelets 230; Potassium 4.3; Sodium 137   Other Studies Reviewed Today:  Cardiac catheterization 05/12/2017:  Prox RCA lesion is 100% stenosed.  Dist LM to Ost LAD lesion is 100% stenosed.  LIMA graft was visualized by angiography and is normal in caliber.  Dist Graft to Insertion lesion is 20% stenosed.  Mid LAD lesion is 30% stenosed.  Prox Cx to Mid Cx lesion is 50% stenosed.  Ost 2nd Mrg to 2nd Mrg lesion is 50% stenosed.  Ost 3rd Mrg lesion is 100% stenosed.  Origin lesion is 100% stenosed.  Origin lesion is 100% stenosed.  SVG graft was not visualized.  SVG graft was not visualized.   1. 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 Presumed that the vein grafts supplied the RCA and OM branches and have been chronically occluded.  2. The ostial LAD is chronically occluded. The LIMA graft to the LAD is patent. The stented segment of the graft at the anastomosis to the LAD is patent with minimal restenosis. The mid LAD stent just beyond the anastomosis is  patent with minimal restenosis.  3. The Circumflex artery is patent with moderate diffuse disease in the proximal and mid vessel. There are several small caliber OM branches with diffuse severe disease. The third Om branch has moderate non-obstructive disease. The 4th OM branch is now occluded. This is a new finding. This branch fills from left to left collaterals.  4. The RCA is chronically occluded in the proximal segment. The vein graft to the PDA is occluded. The mid and distal RCA fills briskly from left to right collaterals.  5. Mild elevation of LV filling pressure  Recommendations: Continue medical management of CAD. He has diffuse disease with no focal targets for PCI.   Carotid Dopplers 02/11/2019: Summary:  Right Carotid: Velocities in the right ICA are consistent with a 40-59%         stenosis.   Left Carotid: Velocities in the left ICA are consistent with a 40-59%  stenosis.   Vertebrals: Bilateral vertebral arteries demonstrate antegrade flow.  Subclavians: Normal flow hemodynamics were seen in bilateral subclavian        arteries.   Assessment and Plan:  1.  Multivessel CAD with graft disease and subsequent DES intervention as reviewed above.  Last cardiac catheterization was in 2018 and medical therapy continues without accelerating angina.  Continue aspirin, Cozaar, Ranexa, and Crestor.  He is no longer on beta-blocker given symptomatic bradycardia.  2.  Paroxysmal atrial fibrillation.  CHA2DS2-VASc score is 5.  He remains on Eliquis 2.5 mg twice daily (age and renal function), reports no spontaneous bleeding problems.  He has maintained sinus rhythm on amiodarone which continues at 200 mg daily.  I reviewed his most recent LFTs and TSH as noted above.  We may eventually reduce amiodarone to 100 mg daily.  3.  Ischemic cardiomyopathy, LVEF 45% by last assessment in 2017.  Follow-up echocardiogram will be obtained.  No longer on Coreg as noted above, continue  Lasix with potassium supplement, and losartan.  4.  CKD stage IIIb, last creatinine 2.16 with normal potassium.  Medication Adjustments/Labs and Tests Ordered: Current medicines are reviewed at length with the patient today.  Concerns regarding medicines are outlined above.   Tests Ordered: Orders Placed This Encounter  Procedures  . ECHOCARDIOGRAM COMPLETE  Medication Changes: No orders of the defined types were placed in this encounter.   Disposition:  Follow up 3 months in the [place] office.  Signed, Satira Sark, MD, [facility] 02/10/2020 9:07 AM    [facility] [place], [place], [place] [address] Phone: [phone]; Fax: [phone]

## 2020-02-10 ENCOUNTER — Ambulatory Visit (INDEPENDENT_AMBULATORY_CARE_PROVIDER_SITE_OTHER): Payer: Medicare Other | Admitting: Cardiology

## 2020-02-10 ENCOUNTER — Encounter: Payer: Self-pay | Admitting: Cardiology

## 2020-02-10 ENCOUNTER — Other Ambulatory Visit: Payer: Self-pay

## 2020-02-10 VITALS — BP 122/58 | HR 61 | Ht 65.0 in | Wt 152.0 lb

## 2020-02-10 DIAGNOSIS — I255 Ischemic cardiomyopathy: Secondary | ICD-10-CM | POA: Diagnosis not present

## 2020-02-10 DIAGNOSIS — I25119 Atherosclerotic heart disease of native coronary artery with unspecified angina pectoris: Secondary | ICD-10-CM | POA: Diagnosis not present

## 2020-02-10 DIAGNOSIS — I4819 Other persistent atrial fibrillation: Secondary | ICD-10-CM

## 2020-02-10 NOTE — Patient Instructions (Addendum)
Medication Instructions Your physician recommends that you continue on your current medications as directed. Please refer to the Current Medication list given to you today.  Labwork: None  Testing/Procedures: Your physician has requested that you have an echocardiogram. Echocardiography is a painless test that uses sound waves to create images of your heart. It provides your doctor with information about the size and shape of your heart and how well your heart's chambers and valves are working. This procedure takes approximately one hour. There are no restrictions for this procedure.  Follow-Up: Your physician recommends that you schedule a follow-up appointment in: 3 months  Any Other Special Instructions Will Be Listed Below (If Applicable).  If you need a refill on your cardiac medications before your next appointment, please call your pharmacy.  

## 2020-02-22 DIAGNOSIS — M25579 Pain in unspecified ankle and joints of unspecified foot: Secondary | ICD-10-CM | POA: Diagnosis not present

## 2020-02-22 DIAGNOSIS — M79671 Pain in right foot: Secondary | ICD-10-CM | POA: Diagnosis not present

## 2020-02-22 DIAGNOSIS — M79672 Pain in left foot: Secondary | ICD-10-CM | POA: Diagnosis not present

## 2020-02-24 ENCOUNTER — Other Ambulatory Visit: Payer: Self-pay | Admitting: Cardiology

## 2020-02-24 ENCOUNTER — Ambulatory Visit (INDEPENDENT_AMBULATORY_CARE_PROVIDER_SITE_OTHER): Payer: Medicare Other

## 2020-02-24 DIAGNOSIS — R0989 Other specified symptoms and signs involving the circulatory and respiratory systems: Secondary | ICD-10-CM | POA: Diagnosis not present

## 2020-02-24 DIAGNOSIS — I6523 Occlusion and stenosis of bilateral carotid arteries: Secondary | ICD-10-CM

## 2020-02-24 DIAGNOSIS — I255 Ischemic cardiomyopathy: Secondary | ICD-10-CM

## 2020-02-24 LAB — ECHOCARDIOGRAM COMPLETE
Area-P 1/2: 3.68 cm2
Calc EF: 35.4 %
MV M vel: 4.35 m/s
MV Peak grad: 75.7 mmHg
P 1/2 time: 1097 msec
S' Lateral: 4.79 cm
Single Plane A2C EF: 37.4 %
Single Plane A4C EF: 31.2 %

## 2020-02-25 ENCOUNTER — Telehealth: Payer: Self-pay | Admitting: *Deleted

## 2020-02-25 NOTE — Telephone Encounter (Signed)
Laurine Blazer, LPN  88/11/5795 2:82 PM EDT Back to Top    Notified, copy to pcp.    Laurine Blazer, LPN  06/0/1561 5:37 PM EDT     Left message to return call.   Verta Ellen., NP  02/23/2020 9:53 PM EDT     Please call the patient and let him know the cardiac monitor did not show any significant tachycardia or bradycardic episodes. He was primarily in normla sinus rhythm. One episode of suspected atrial fibrillation which but the rate was controlled.

## 2020-02-29 ENCOUNTER — Telehealth: Payer: Self-pay | Admitting: *Deleted

## 2020-02-29 DIAGNOSIS — I255 Ischemic cardiomyopathy: Secondary | ICD-10-CM

## 2020-02-29 DIAGNOSIS — I10 Essential (primary) hypertension: Secondary | ICD-10-CM

## 2020-02-29 DIAGNOSIS — R0989 Other specified symptoms and signs involving the circulatory and respiratory systems: Secondary | ICD-10-CM

## 2020-02-29 NOTE — Telephone Encounter (Signed)
Patient informed and verbalized understanding of plan. Will have lab work done at [place] sent to PCP

## 2020-02-29 NOTE — Telephone Encounter (Signed)
Patient informed via vm. Copy sent to PCP. 

## 2020-02-29 NOTE — Telephone Encounter (Signed)
-----   Message from Satira Sark, MD sent at 02/26/2020  7:29 AM EDT ----- Results reviewed. Moderate bilateral ICA stenosis. Continue medical therapy and follow-up carotid Dopplers in one year.

## 2020-02-29 NOTE — Telephone Encounter (Signed)
-----   Message from Satira Sark, MD sent at 02/24/2020  4:27 PM EDT ----- Results reviewed.  LVEF reduced to the range of 30 to 35%, reportedly similar to outside study done in July.  Mitral regurgitation is moderate as is degree of pulmonary hypertension.  He was relatively stable as of recent office encounter.  I would suggest that we get a follow-up BMET.  Not certain if he would tolerate a switch from losartan to [facility], but I will consider this based on lab results.

## 2020-03-01 ENCOUNTER — Telehealth: Payer: Self-pay | Admitting: *Deleted

## 2020-03-01 DIAGNOSIS — I255 Ischemic cardiomyopathy: Secondary | ICD-10-CM

## 2020-03-01 DIAGNOSIS — I10 Essential (primary) hypertension: Secondary | ICD-10-CM

## 2020-03-01 MED ORDER — ENTRESTO 24-26 MG PO TABS: 1.0000 | ORAL_TABLET | Freq: Two times a day (BID) | ORAL | 6 refills | Status: DC

## 2020-03-01 NOTE — Telephone Encounter (Signed)
-----   Message from Satira Sark, MD sent at 02/29/2020  3:00 PM EDT ----- Results reviewed.  Potassium normal at 3.8 and creatinine somewhat better at 1.91.  He is already on Cozaar 25 mg daily.  We could consider trying to switch to Entresto 24/26 mg twice daily given his cardiomyopathy.  If so, will check BMET after switch in 7 to 10 days.

## 2020-03-01 NOTE — Telephone Encounter (Signed)
Patient informed and verbalized understanding of plan. Copy sent to PCP. Lab order faxed to [facility].

## 2020-03-10 DIAGNOSIS — E039 Hypothyroidism, unspecified: Secondary | ICD-10-CM | POA: Diagnosis not present

## 2020-03-10 DIAGNOSIS — K219 Gastro-esophageal reflux disease without esophagitis: Secondary | ICD-10-CM | POA: Diagnosis not present

## 2020-03-10 DIAGNOSIS — I10 Essential (primary) hypertension: Secondary | ICD-10-CM | POA: Diagnosis not present

## 2020-03-14 DIAGNOSIS — I4891 Unspecified atrial fibrillation: Secondary | ICD-10-CM | POA: Diagnosis not present

## 2020-03-14 DIAGNOSIS — Z23 Encounter for immunization: Secondary | ICD-10-CM | POA: Diagnosis not present

## 2020-03-14 DIAGNOSIS — Z955 Presence of coronary angioplasty implant and graft: Secondary | ICD-10-CM | POA: Diagnosis not present

## 2020-03-14 DIAGNOSIS — Z951 Presence of aortocoronary bypass graft: Secondary | ICD-10-CM | POA: Diagnosis not present

## 2020-03-14 DIAGNOSIS — I255 Ischemic cardiomyopathy: Secondary | ICD-10-CM | POA: Diagnosis not present

## 2020-03-14 DIAGNOSIS — I2510 Atherosclerotic heart disease of native coronary artery without angina pectoris: Secondary | ICD-10-CM | POA: Diagnosis not present

## 2020-03-16 ENCOUNTER — Other Ambulatory Visit (HOSPITAL_COMMUNITY): Payer: Self-pay | Admitting: Nurse Practitioner

## 2020-03-23 DIAGNOSIS — M79671 Pain in right foot: Secondary | ICD-10-CM | POA: Diagnosis not present

## 2020-03-23 DIAGNOSIS — M79672 Pain in left foot: Secondary | ICD-10-CM | POA: Diagnosis not present

## 2020-03-23 DIAGNOSIS — M25579 Pain in unspecified ankle and joints of unspecified foot: Secondary | ICD-10-CM | POA: Diagnosis not present

## 2020-04-17 DIAGNOSIS — M79672 Pain in left foot: Secondary | ICD-10-CM | POA: Diagnosis not present

## 2020-04-17 DIAGNOSIS — M79671 Pain in right foot: Secondary | ICD-10-CM | POA: Diagnosis not present

## 2020-04-18 DIAGNOSIS — D333 Benign neoplasm of cranial nerves: Secondary | ICD-10-CM | POA: Diagnosis not present

## 2020-04-18 DIAGNOSIS — H9313 Tinnitus, bilateral: Secondary | ICD-10-CM | POA: Diagnosis not present

## 2020-04-18 DIAGNOSIS — H903 Sensorineural hearing loss, bilateral: Secondary | ICD-10-CM | POA: Diagnosis not present

## 2020-04-19 ENCOUNTER — Other Ambulatory Visit (HOSPITAL_COMMUNITY): Payer: Self-pay | Admitting: Otolaryngology

## 2020-04-19 ENCOUNTER — Other Ambulatory Visit: Payer: Self-pay | Admitting: Otolaryngology

## 2020-04-19 DIAGNOSIS — G9389 Other specified disorders of brain: Secondary | ICD-10-CM

## 2020-04-24 DIAGNOSIS — R002 Palpitations: Secondary | ICD-10-CM | POA: Diagnosis not present

## 2020-04-24 DIAGNOSIS — I10 Essential (primary) hypertension: Secondary | ICD-10-CM | POA: Diagnosis not present

## 2020-04-24 DIAGNOSIS — Z72820 Sleep deprivation: Secondary | ICD-10-CM | POA: Diagnosis not present

## 2020-05-01 ENCOUNTER — Other Ambulatory Visit: Payer: Self-pay | Admitting: Family Medicine

## 2020-05-02 ENCOUNTER — Other Ambulatory Visit: Payer: Self-pay

## 2020-05-02 ENCOUNTER — Ambulatory Visit (HOSPITAL_COMMUNITY)
Admission: RE | Admit: 2020-05-02 | Discharge: 2020-05-02 | Disposition: A | Discharge status: Home / Self Care | Payer: Medicare Other | Source: Ambulatory Visit | Attending: Otolaryngology | Admitting: Otolaryngology

## 2020-05-02 DIAGNOSIS — C719 Malignant neoplasm of brain, unspecified: Secondary | ICD-10-CM | POA: Diagnosis not present

## 2020-05-02 DIAGNOSIS — D329 Benign neoplasm of meninges, unspecified: Secondary | ICD-10-CM | POA: Diagnosis not present

## 2020-05-02 DIAGNOSIS — G9389 Other specified disorders of brain: Secondary | ICD-10-CM | POA: Diagnosis not present

## 2020-05-02 MED ORDER — GADOBUTROL 1 MMOL/ML IV SOLN
7.0000 mL | Freq: Once | INTRAVENOUS | Status: AC | PRN
  Administered 2020-05-02: 7 mL via INTRAVENOUS

## 2020-05-08 DIAGNOSIS — D329 Benign neoplasm of meninges, unspecified: Secondary | ICD-10-CM | POA: Diagnosis not present

## 2020-05-10 DIAGNOSIS — I4891 Unspecified atrial fibrillation: Secondary | ICD-10-CM | POA: Diagnosis not present

## 2020-05-10 DIAGNOSIS — I2510 Atherosclerotic heart disease of native coronary artery without angina pectoris: Secondary | ICD-10-CM | POA: Diagnosis not present

## 2020-05-10 DIAGNOSIS — R001 Bradycardia, unspecified: Secondary | ICD-10-CM | POA: Diagnosis not present

## 2020-05-10 DIAGNOSIS — I739 Peripheral vascular disease, unspecified: Secondary | ICD-10-CM | POA: Diagnosis not present

## 2020-05-10 DIAGNOSIS — R531 Weakness: Secondary | ICD-10-CM | POA: Diagnosis not present

## 2020-05-10 DIAGNOSIS — I10 Essential (primary) hypertension: Secondary | ICD-10-CM | POA: Diagnosis not present

## 2020-05-10 DIAGNOSIS — Z7902 Long term (current) use of antithrombotics/antiplatelets: Secondary | ICD-10-CM | POA: Diagnosis not present

## 2020-05-10 DIAGNOSIS — R0602 Shortness of breath: Secondary | ICD-10-CM | POA: Diagnosis not present

## 2020-05-10 DIAGNOSIS — Z87891 Personal history of nicotine dependence: Secondary | ICD-10-CM | POA: Diagnosis not present

## 2020-05-10 DIAGNOSIS — Z7901 Long term (current) use of anticoagulants: Secondary | ICD-10-CM | POA: Diagnosis not present

## 2020-05-10 DIAGNOSIS — K219 Gastro-esophageal reflux disease without esophagitis: Secondary | ICD-10-CM | POA: Diagnosis not present

## 2020-05-10 DIAGNOSIS — I517 Cardiomegaly: Secondary | ICD-10-CM | POA: Diagnosis not present

## 2020-05-10 DIAGNOSIS — Z8673 Personal history of transient ischemic attack (TIA), and cerebral infarction without residual deficits: Secondary | ICD-10-CM | POA: Diagnosis not present

## 2020-05-10 DIAGNOSIS — R2689 Other abnormalities of gait and mobility: Secondary | ICD-10-CM | POA: Diagnosis not present

## 2020-05-10 DIAGNOSIS — G319 Degenerative disease of nervous system, unspecified: Secondary | ICD-10-CM | POA: Diagnosis not present

## 2020-05-10 DIAGNOSIS — E785 Hyperlipidemia, unspecified: Secondary | ICD-10-CM | POA: Diagnosis not present

## 2020-05-10 DIAGNOSIS — Z7982 Long term (current) use of aspirin: Secondary | ICD-10-CM | POA: Diagnosis not present

## 2020-05-10 DIAGNOSIS — E875 Hyperkalemia: Secondary | ICD-10-CM | POA: Diagnosis not present

## 2020-05-10 DIAGNOSIS — N179 Acute kidney failure, unspecified: Secondary | ICD-10-CM | POA: Diagnosis not present

## 2020-05-10 DIAGNOSIS — I252 Old myocardial infarction: Secondary | ICD-10-CM | POA: Diagnosis not present

## 2020-05-11 DIAGNOSIS — D649 Anemia, unspecified: Secondary | ICD-10-CM | POA: Diagnosis not present

## 2020-05-11 DIAGNOSIS — R001 Bradycardia, unspecified: Secondary | ICD-10-CM | POA: Diagnosis not present

## 2020-05-11 DIAGNOSIS — N179 Acute kidney failure, unspecified: Secondary | ICD-10-CM | POA: Diagnosis not present

## 2020-05-11 DIAGNOSIS — R531 Weakness: Secondary | ICD-10-CM | POA: Diagnosis not present

## 2020-05-12 DIAGNOSIS — I10 Essential (primary) hypertension: Secondary | ICD-10-CM | POA: Diagnosis not present

## 2020-05-12 DIAGNOSIS — N179 Acute kidney failure, unspecified: Secondary | ICD-10-CM | POA: Diagnosis not present

## 2020-05-12 DIAGNOSIS — R000 Tachycardia, unspecified: Secondary | ICD-10-CM | POA: Diagnosis not present

## 2020-05-12 DIAGNOSIS — D638 Anemia in other chronic diseases classified elsewhere: Secondary | ICD-10-CM | POA: Diagnosis not present

## 2020-05-13 DIAGNOSIS — N179 Acute kidney failure, unspecified: Secondary | ICD-10-CM | POA: Diagnosis not present

## 2020-05-15 ENCOUNTER — Ambulatory Visit: Payer: Medicare Other | Admitting: Cardiology

## 2020-05-15 NOTE — Progress Notes (Deleted)
Cardiology Office Note  Date: 05/15/2020   ID: Hunter Townsend, Hunter Townsend [birth year], MRN [id number]  PCP:  Curlene Labrum, MD  Cardiologist:  Rozann Lesches, MD Electrophysiologist:  None   No chief complaint on file.   History of Present Illness: Hunter Townsend is an 79 y.o. male last seen in September.  He was recently hospitalized at [facility].  Records are limited, he was apparently evaluated for weakness and found to have acute renal insufficiency.  He was taken off Entresto.  Still creatinine was 3.59 down to 2.55.  Past Medical History:  Diagnosis Date  . Arthritis   . Atrial fibrillation ([place])   . BPH (benign prostatic hyperplasia)   . CHF (congestive heart failure) ([place])   . Coronary atherosclerosis of native coronary artery    Multivessel, NSTEMI  s/p PTCA/DES to mid LAD 06/24/11,, known patent LIMA-LAD and occlusion of all vein grafts  . Hypertension   . Ischemic cardiomyopathy    EF 35-40% by cath 06/24/11 (EF 37% by nuclear stress test 06/2010).  . Myocardial infarction ([place]) 2013  . PAD (peripheral artery disease) (HCC)    Status post left SFA stent - Dr. Burt Knack    Past Surgical History:  Procedure Laterality Date  . BACK SURGERY     "cut me ~ 1/2inch to take sciatic nerve out of my right leg"  . CARDIAC CATHETERIZATION  03/28/2016  . CARDIAC CATHETERIZATION N/A 03/28/2016   Procedure: Left Heart Cath and Cors/Grafts Angiography;  Surgeon: Leonie Man, MD;  Location: [place] [facility];  Service: Cardiovascular;  Laterality: N/A;  . CARDIOVERSION N/A 09/21/2019   Procedure: CARDIOVERSION;  Surgeon: Skeet Latch, MD;  Location: [place];  Service: Cardiovascular;  Laterality: N/A;  . CATARACT EXTRACTION W/ INTRAOCULAR LENS  IMPLANT, BILATERAL    . CORONARY ANGIOPLASTY    . CORONARY ARTERY BYPASS GRAFT  1987   [place], [place] - LIMA to LAD, SVG to RCA presumably  . LEFT HEART CATH AND CORS/GRAFTS ANGIOGRAPHY N/A 05/12/2017   Procedure:  LEFT HEART CATH AND CORS/GRAFTS ANGIOGRAPHY;  Surgeon: Burnell Blanks, MD;  Location: [place] CV LAB;  Service: Cardiovascular;  Laterality: N/A;  . LEFT HEART CATHETERIZATION WITH CORONARY/GRAFT ANGIOGRAM  06/24/2011   Procedure: LEFT HEART CATHETERIZATION WITH Beatrix Fetters;  Surgeon: Burnell Blanks, MD;  Location: [facility] CATH LAB;  Service: Cardiovascular;;  . LEFT HEART CATHETERIZATION WITH CORONARY/GRAFT ANGIOGRAM N/A 02/26/2013   Procedure: LEFT HEART CATHETERIZATION WITH Beatrix Fetters;  Surgeon: Blane Ohara, MD;  Location: [facility] CATH LAB;  Service: Cardiovascular;  Laterality: N/A;  . LOWER EXTREMITY ANGIOGRAM N/A 10/30/2011   Procedure: LOWER EXTREMITY ANGIOGRAM;  Surgeon: Sherren Mocha, MD;  Location: [facility] CATH LAB;  Service: Cardiovascular;  Laterality: N/A;  . PERCUTANEOUS CORONARY STENT INTERVENTION (PCI-S)  02/26/2013   Procedure: PERCUTANEOUS CORONARY STENT INTERVENTION (PCI-S);  Surgeon: Blane Ohara, MD;  Location: [facility] CATH LAB;  Service: Cardiovascular;;    Current Outpatient Medications  Medication Sig Dispense Refill  . acetaminophen (TYLENOL) 325 MG tablet Take 650 mg by mouth every 12 (twelve) hours as needed for moderate pain or headache.     Marland Kitchen amiodarone (PACERONE) 200 MG tablet TAKE 1 TABLET ONCE DAILY. 90 tablet 1  . aspirin EC 81 MG tablet Take 1 tablet (81 mg total) by mouth daily. 90 tablet 3  . ELIQUIS 2.5 MG TABS tablet TAKE (1) TABLET TWICE DAILY. 60 tablet 6  . furosemide (LASIX) 40 MG tablet TAKE 1  TABLET ONCE DAILY. 90 tablet 1  . gabapentin (NEURONTIN) 100 MG capsule Take 100 mg by mouth 3 (three) times daily.     Marland Kitchen LORazepam (ATIVAN) 1 MG tablet Take 1 mg by mouth at bedtime as needed for anxiety (sleep).     . Multiple Vitamin (MULTIVITAMIN) tablet Take 1 tablet by mouth daily.    . nitroGLYCERIN (NITROSTAT) 0.4 MG SL tablet Place 1 tablet (0.4 mg total) under the tongue every 5 (five) minutes x 3 doses as needed for  chest pain (if no relief after 3rd dose, proceed to the ED for an evaluation or call 911). For chest pain 25 tablet 3  . Omega-3 Fatty Acids (FISH OIL) 1000 MG CAPS Take 2,000 mg by mouth daily.    Marland Kitchen OVER THE COUNTER MEDICATION Place 1 application rectally as needed (constipation). Suppository to help with BM with Occasional fleet enema     . pantoprazole (PROTONIX) 40 MG tablet TAKE 1 TABLET TWICE DAILY BEFORE A MEAL. (Patient taking differently: Take 40 mg by mouth daily. ) 180 tablet 3  . potassium chloride (KLOR-CON) 10 MEQ tablet Take 10 mEq by mouth daily.     . ranolazine (RANEXA) 500 MG 12 hr tablet Take 1 tablet (500 mg total) by mouth every 12 (twelve) hours. 180 tablet 3  . rosuvastatin (CRESTOR) 10 MG tablet TAKE 1 TABLET BY MOUTH ONCE DAILY. 90 tablet 2  . sacubitril-valsartan (ENTRESTO) 24-26 MG Take 1 tablet by mouth 2 (two) times daily. 60 tablet 6  . triamcinolone cream (KENALOG) 0.1 % Apply 1 application topically daily as needed (rash).      No current facility-administered medications for this visit.   Allergies:  Patient has no known allergies.   Social History: The patient  reports that he quit smoking about 26 years ago. His smoking use included cigarettes. He has a 80.00 pack-year smoking history. He has never used smokeless tobacco. He reports that he does not drink alcohol and does not use drugs.   Family History: The patient's family history includes CAD in an other family member; Hypertension in an other family member.   ROS:  Please see the history of present illness. Otherwise, complete review of systems is positive for {NONE DEFAULTED:18576::"none"}.  All other systems are reviewed and negative.   Physical Exam: VS:  There were no vitals taken for this visit., BMI There is no height or weight on file to calculate BMI.  Wt Readings from Last 3 Encounters:  02/10/20 152 lb (68.9 kg)  12/28/19 155 lb 12.8 oz (70.7 kg)  12/19/19 153 lb 6.5 oz (69.6 kg)     General: Patient appears comfortable at rest. HEENT: Conjunctiva and lids normal, oropharynx clear with moist mucosa. Neck: Supple, no elevated JVP or carotid bruits, no thyromegaly. Lungs: Clear to auscultation, nonlabored breathing at rest. Cardiac: Regular rate and rhythm, no S3 or significant systolic murmur, no pericardial rub. Abdomen: Soft, nontender, no hepatomegaly, bowel sounds present, no guarding or rebound. Extremities: No pitting edema, distal pulses 2+. Skin: Warm and dry. Musculoskeletal: No kyphosis. Neuropsychiatric: Alert and oriented x3, affect grossly appropriate.  ECG:  An ECG dated 12/19/2019 was personally reviewed today and demonstrated:  Sinus rhythm with prolonged PR interval and IVCD.  Recent Labwork: 10/15/2019: TSH 1.476 12/19/2019: ALT 19; AST 24; BUN 32; Creatinine, Ser 2.16; Hemoglobin 9.9; Platelets 230; Potassium 4.3; Sodium 137  December 2021: BUN 47, creatinine 2.55, potassium 4.8 with 9.3, platelets 206, high-sensitivity troponin I of 23 and 17  Other Studies Reviewed Today:  Echocardiogram 02/24/2020: 1. Left ventricular ejection fraction, by estimation, is 30 to 35%. The  left ventricle has moderately decreased function. The left ventricle  demonstrates regional wall motion abnormalities (see scoring  diagram/findings for description). The left  ventricular internal cavity size was mildly dilated. Left ventricular  diastolic parameters are consistent with Grade II diastolic dysfunction  (pseudonormalization).  2. Right ventricular systolic function is normal. The right ventricular  size is normal. There is moderately elevated pulmonary artery systolic  pressure. The estimated right ventricular systolic pressure is 41.3 mmHg.  3. Left atrial size was mildly dilated.  4. Right atrial size was moderately dilated.  5. Ascites and right pleural effusion noted.  6. The mitral valve is grossly normal. Moderate mitral valve  regurgitation.  7.  Tricuspid valve regurgitation is moderate to severe.  8. The aortic valve is tricuspid. Aortic valve regurgitation is trivial.  Aortic regurgitation PHT measures 1097 msec.  9. The inferior vena cava is dilated in size with <50% respiratory  variability, suggesting right atrial pressure of 15 mmHg.   Head CT 05/10/2020: IMPRESSION:  Atrophy, chronic microvascular disease.   No acute intracranial abnormality.   Old bilateral basal ganglia lacunar infarcts.   Assessment and Plan:    Medication Adjustments/Labs and Tests Ordered: Current medicines are reviewed at length with the patient today.  Concerns regarding medicines are outlined above.   Tests Ordered: No orders of the defined types were placed in this encounter.   Medication Changes: No orders of the defined types were placed in this encounter.   Disposition:  Follow up {follow up:15908}  Signed, Satira Sark, MD, [facility] 05/15/2020 2:30 PM    [facility] [place] [address], [place], [place] [address] Phone: [phone]; Fax: [phone]

## 2020-05-16 ENCOUNTER — Other Ambulatory Visit (HOSPITAL_COMMUNITY)
Admission: RE | Admit: 2020-05-16 | Discharge: 2020-05-16 | Disposition: A | Discharge status: Home / Self Care | Payer: Medicare Other | Source: Ambulatory Visit | Attending: Cardiology | Admitting: Cardiology

## 2020-05-16 ENCOUNTER — Encounter: Payer: Self-pay | Admitting: Cardiology

## 2020-05-16 ENCOUNTER — Other Ambulatory Visit: Payer: Self-pay

## 2020-05-16 ENCOUNTER — Ambulatory Visit (INDEPENDENT_AMBULATORY_CARE_PROVIDER_SITE_OTHER): Payer: Medicare Other | Admitting: Cardiology

## 2020-05-16 VITALS — BP 128/60 | HR 56 | Ht 64.0 in | Wt 151.0 lb

## 2020-05-16 DIAGNOSIS — I255 Ischemic cardiomyopathy: Secondary | ICD-10-CM | POA: Diagnosis not present

## 2020-05-16 DIAGNOSIS — N1832 Chronic kidney disease, stage 3b: Secondary | ICD-10-CM | POA: Diagnosis not present

## 2020-05-16 DIAGNOSIS — I25119 Atherosclerotic heart disease of native coronary artery with unspecified angina pectoris: Secondary | ICD-10-CM | POA: Diagnosis not present

## 2020-05-16 DIAGNOSIS — I480 Paroxysmal atrial fibrillation: Secondary | ICD-10-CM

## 2020-05-16 LAB — BASIC METABOLIC PANEL
Anion gap: 7 (ref 5–15)
BUN: 32 mg/dL — ABNORMAL HIGH (ref 8–23)
CO2: 24 mmol/L (ref 22–32)
Calcium: 8.5 mg/dL — ABNORMAL LOW (ref 8.9–10.3)
Chloride: 110 mmol/L (ref 98–111)
Creatinine, Ser: 1.84 mg/dL — ABNORMAL HIGH (ref 0.61–1.24)
GFR, Estimated: 36 mL/min — ABNORMAL LOW (ref >60)
Glucose, Bld: 87 mg/dL (ref 70–99)
Potassium: 3.9 mmol/L (ref 3.5–5.1)
Sodium: 141 mmol/L (ref 135–145)

## 2020-05-16 MED ORDER — AMIODARONE HCL 200 MG PO TABS: 100.0000 mg | ORAL_TABLET | Freq: Every day | ORAL | 3 refills | Status: DC

## 2020-05-16 NOTE — Progress Notes (Signed)
Cardiology Office Note  Date: 05/16/2020   ID: Lott, Baladez January 03, 1941, MRN [id number]  PCP:  Curlene Labrum, MD  Cardiologist:  Rozann Lesches, MD Electrophysiologist:  None   Chief Complaint  Patient presents with  . Hospitalization Follow-up    History of Present Illness: JAYDAN LOTITO is an 79 y.o. male last seen in September.  He presents for a follow-up visit.  He was recently hospitalized at [facility] with weakness and evidence of acute renal failure.  Creatinine was 3.59, down to 2.55 at discharge.  It looks like medications were held during his stay, but he is back on his prior regimen which is outlined below.  He recalls that his blood pressure was low when he went into the hospital.  Today's vital signs are stable, he remains bradycardic, we had already taken him off Coreg.  We discussed reducing amiodarone to 100 mg daily and getting follow-up lab work.  Not entirely clear that he will be able to stay on Entresto, or he may just need a reduction in his diuretics.  Past Medical History:  Diagnosis Date  . Arthritis   . Atrial fibrillation ([place])   . BPH (benign prostatic hyperplasia)   . CHF (congestive heart failure) ([place])   . Coronary atherosclerosis of native coronary artery    Multivessel, NSTEMI  s/p PTCA/DES to mid LAD 06/24/11,, known patent LIMA-LAD and occlusion of all vein grafts  . Hypertension   . Ischemic cardiomyopathy    EF 35-40% by cath 06/24/11 (EF 37% by nuclear stress test 06/2010).  . Myocardial infarction ([place]) 2013  . PAD (peripheral artery disease) (HCC)    Status post left SFA stent - Dr. Burt Knack    Past Surgical History:  Procedure Laterality Date  . BACK SURGERY     "cut me ~ 1/2inch to take sciatic nerve out of my right leg"  . CARDIAC CATHETERIZATION  03/28/2016  . CARDIAC CATHETERIZATION N/A 03/28/2016   Procedure: Left Heart Cath and Cors/Grafts Angiography;  Surgeon: Leonie Man, MD;  Location: [place]  CV LAB;  Service: Cardiovascular;  Laterality: N/A;  . CARDIOVERSION N/A 09/21/2019   Procedure: CARDIOVERSION;  Surgeon: Skeet Latch, MD;  Location: [place];  Service: Cardiovascular;  Laterality: N/A;  . CATARACT EXTRACTION W/ INTRAOCULAR LENS  IMPLANT, BILATERAL    . CORONARY ANGIOPLASTY    . CORONARY ARTERY BYPASS GRAFT  1987   [place] - LIMA to LAD, SVG to RCA presumably  . LEFT HEART CATH AND CORS/GRAFTS ANGIOGRAPHY N/A 05/12/2017   Procedure: LEFT HEART CATH AND CORS/GRAFTS ANGIOGRAPHY;  Surgeon: Burnell Blanks, MD;  Location: [place] CV LAB;  Service: Cardiovascular;  Laterality: N/A;  . LEFT HEART CATHETERIZATION WITH CORONARY/GRAFT ANGIOGRAM  06/24/2011   Procedure: LEFT HEART CATHETERIZATION WITH Beatrix Fetters;  Surgeon: Burnell Blanks, MD;  Location: [facility] CATH LAB;  Service: Cardiovascular;;  . LEFT HEART CATHETERIZATION WITH CORONARY/GRAFT ANGIOGRAM N/A 02/26/2013   Procedure: LEFT HEART CATHETERIZATION WITH Beatrix Fetters;  Surgeon: Blane Ohara, MD;  Location: [facility] CATH LAB;  Service: Cardiovascular;  Laterality: N/A;  . LOWER EXTREMITY ANGIOGRAM N/A 10/30/2011   Procedure: LOWER EXTREMITY ANGIOGRAM;  Surgeon: Sherren Mocha, MD;  Location: [facility] CATH LAB;  Service: Cardiovascular;  Laterality: N/A;  . PERCUTANEOUS CORONARY STENT INTERVENTION (PCI-S)  02/26/2013   Procedure: PERCUTANEOUS CORONARY STENT INTERVENTION (PCI-S);  Surgeon: Blane Ohara, MD;  Location: [facility] CATH LAB;  Service: Cardiovascular;;    Current Outpatient Medications  Medication Sig Dispense Refill  . acetaminophen (TYLENOL) 325 MG tablet Take 650 mg by mouth every 12 (twelve) hours as needed for moderate pain or headache.     Marland Kitchen aspirin EC 81 MG tablet Take 1 tablet (81 mg total) by mouth daily. 90 tablet 3  . ELIQUIS 2.5 MG TABS tablet TAKE (1) TABLET TWICE DAILY. 60 tablet 6  . furosemide (LASIX) 40 MG tablet TAKE 1 TABLET ONCE DAILY. 90 tablet 1  .  gabapentin (NEURONTIN) 100 MG capsule Take 100 mg by mouth 3 (three) times daily.     Marland Kitchen LORazepam (ATIVAN) 1 MG tablet Take 1 mg by mouth at bedtime as needed for anxiety (sleep).    . Multiple Vitamin (MULTIVITAMIN) tablet Take 1 tablet by mouth daily.    . nitroGLYCERIN (NITROSTAT) 0.4 MG SL tablet Place 1 tablet (0.4 mg total) under the tongue every 5 (five) minutes x 3 doses as needed for chest pain (if no relief after 3rd dose, proceed to the ED for an evaluation or call 911). For chest pain 25 tablet 3  . Omega-3 Fatty Acids (FISH OIL) 1000 MG CAPS Take 2,000 mg by mouth daily.    Marland Kitchen OVER THE COUNTER MEDICATION Place 1 application rectally as needed (constipation). Suppository to help with BM with Occasional fleet enema    . pantoprazole (PROTONIX) 40 MG tablet TAKE 1 TABLET TWICE DAILY BEFORE A MEAL. (Patient taking differently: Take 40 mg by mouth daily.) 180 tablet 3  . potassium chloride (KLOR-CON) 10 MEQ tablet Take 10 mEq by mouth daily.     . ranolazine (RANEXA) 500 MG 12 hr tablet Take 1 tablet (500 mg total) by mouth every 12 (twelve) hours. 180 tablet 3  . rosuvastatin (CRESTOR) 10 MG tablet TAKE 1 TABLET BY MOUTH ONCE DAILY. 90 tablet 2  . sacubitril-valsartan (ENTRESTO) 24-26 MG Take 1 tablet by mouth 2 (two) times daily. 60 tablet 6  . triamcinolone cream (KENALOG) 0.1 % Apply 1 application topically daily as needed (rash).     Marland Kitchen amiodarone (PACERONE) 200 MG tablet Take 0.5 tablets (100 mg total) by mouth daily. 90 tablet 3   No current facility-administered medications for this visit.   Allergies:  Patient has no known allergies.   ROS: No palpitations or syncope.  No progressive angina.  Physical Exam: VS:  BP 128/60   Pulse (!) 56   Ht 5\' 4"  (1.626 m)   Wt 151 lb (68.5 kg)   SpO2 99%   BMI 25.92 kg/m , BMI Body mass index is 25.92 kg/m.  Wt Readings from Last 3 Encounters:  05/16/20 151 lb (68.5 kg)  02/10/20 152 lb (68.9 kg)  12/28/19 155 lb 12.8 oz (70.7 kg)     General: Elderly male, appears comfortable at rest. HEENT: Conjunctiva and lids normal, wearing a mask. Neck: Supple, no elevated JVP or carotid bruits, no thyromegaly. Lungs: Clear to auscultation, nonlabored breathing at rest. Cardiac: Regular rate and rhythm, no S3, soft systolic murmur, no pericardial rub. Extremities: No pitting edema.  ECG:  An ECG dated 12/19/2019 was personally reviewed today and demonstrated:  Sinus rhythm with prolonged PR interval and IVCD.  Recent Labwork: 10/15/2019: TSH 1.476 12/19/2019: ALT 19; AST 24; BUN 32; Creatinine, Ser 2.16; Hemoglobin 9.9; Platelets 230; Potassium 4.3; Sodium 137     Component Value Date/Time   CHOL 152 06/25/2011 0400   TRIG 150 (H) 06/25/2011 0400   HDL 28 (L) 06/25/2011 0400   CHOLHDL 5.4 06/25/2011 0400  VLDL 30 06/25/2011 0400   LDLCALC 94 06/25/2011 0400  December 2021: BUN 47, creatinine 2.55, potassium 4.8, hemoglobin 9.3, platelets 206, high-sensitivity troponin I of 17 and 23, TSH 6.56  Other Studies Reviewed Today:  Echocardiogram 02/24/2020: 1. Left ventricular ejection fraction, by estimation, is 30 to 35%. The  left ventricle has moderately decreased function. The left ventricle  demonstrates regional wall motion abnormalities (see scoring  diagram/findings for description). The left  ventricular internal cavity size was mildly dilated. Left ventricular  diastolic parameters are consistent with Grade II diastolic dysfunction  (pseudonormalization).  2. Right ventricular systolic function is normal. The right ventricular  size is normal. There is moderately elevated pulmonary artery systolic  pressure. The estimated right ventricular systolic pressure is [id number] mmHg.  3. Left atrial size was mildly dilated.  4. Right atrial size was moderately dilated.  5. Ascites and right pleural effusion noted.  6. The mitral valve is grossly normal. Moderate mitral valve  regurgitation.  7. Tricuspid valve  regurgitation is moderate to severe.  8. The aortic valve is tricuspid. Aortic valve regurgitation is trivial.  Aortic regurgitation PHT measures 1097 msec.  9. The inferior vena cava is dilated in size with <50% respiratory  variability, suggesting right atrial pressure of 15 mmHg.   Carotid Dopplers 02/24/2020: Summary:  Right Carotid: Velocities in the right ICA are consistent with a 40-59%         stenosis.   Left Carotid: Velocities in the left ICA are consistent with a 40-59%  stenosis.   Vertebrals: Bilateral vertebral arteries demonstrate antegrade flow.  Subclavians: Normal flow hemodynamics were seen in bilateral subclavian        arteries.   Assessment and Plan:  1.  Ischemic cardiomyopathy with LVEF 30 to 35%, normal RV contraction with moderately elevated RVSP.  He was taken off Coreg due to symptomatic bradycardia, switched from losartan to [facility].  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  Plan to obtain follow-up BMET.  We may need to either stop Entresto or perhaps reduce his diuretic.  Not a candidate for Aldactone.  2.  Multivessel CAD with graft disease and subsequent DES intervention as discussed above.  Continue aspirin, Ranexa, and Crestor.  3.  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  4.  Paroxysmal atrial fibrillation.  CHA2DS2-VASc score is 5.  Continue low-dose Eliquis, reduce amiodarone to 100 mg daily.  Medication Adjustments/Labs and Tests Ordered: Current medicines are reviewed at length with the patient today.  Concerns regarding medicines are outlined above.   Tests Ordered: Orders Placed This Encounter  Procedures  . Basic Metabolic Panel (BMET)    Medication Changes: Meds ordered this encounter  Medications  . amiodarone (PACERONE) 200 MG tablet    Sig: Take 0.5 tablets (100 mg total) by mouth daily.    Dispense:  90 tablet    Refill:  3     05/16/20 decreased to 100 mg qd    Disposition:  Follow up 1 month.  Signed, Satira Sark, MD, [facility] 05/16/2020 2:26 PM    [place] at [facility] [address], [place], [place] [address] Phone: [phone]; Fax: [phone]

## 2020-05-16 NOTE — Patient Instructions (Signed)
Medication Instructions:  DECREASE Amiodarone to 100 mg daily    *If you need a refill on your cardiac medications before your next appointment, please call your pharmacy*   Lab Work: BMET today If you have labs (blood work) drawn today and your tests are completely normal, you will receive your results only by: Marland Kitchen MyChart Message (if you have MyChart) OR . A paper copy in the mail If you have any lab test that is abnormal or we need to change your treatment, we will call you to review the results.   Testing/Procedures: None today   Follow-Up: At [facility], you and your health needs are our priority.  As part of our continuing mission to provide you with exceptional heart care, we have created designated Provider Care Teams.  These Care Teams include your primary Cardiologist (physician) and Advanced Practice Providers (APPs -  Physician Assistants and Nurse Practitioners) who all work together to provide you with the care you need, when you need it.  We recommend signing up for the patient portal called "MyChart".  Sign up information is provided on this After Visit Summary.  MyChart is used to connect with patients for Virtual Visits (Telemedicine).  Patients are able to view lab/test results, encounter notes, upcoming appointments, etc.  Non-urgent messages can be sent to your provider as well.   To learn more about what you can do with MyChart, go to [email or web address].    Your next appointment:   1 month(s)  The format for your next appointment:   In Person  Provider:   You may see Rozann Lesches, MD or one of the following Advanced Practice Providers on your designated Care Team:    Bernerd Pho, PA-C   Ermalinda Barrios, PA-C     Other Instructions None     Thank you for choosing [place] [facility] !

## 2020-05-25 DIAGNOSIS — I255 Ischemic cardiomyopathy: Secondary | ICD-10-CM | POA: Diagnosis not present

## 2020-05-25 DIAGNOSIS — Z951 Presence of aortocoronary bypass graft: Secondary | ICD-10-CM | POA: Diagnosis not present

## 2020-05-25 DIAGNOSIS — I2510 Atherosclerotic heart disease of native coronary artery without angina pectoris: Secondary | ICD-10-CM | POA: Diagnosis not present

## 2020-05-25 DIAGNOSIS — G5601 Carpal tunnel syndrome, right upper limb: Secondary | ICD-10-CM | POA: Diagnosis not present

## 2020-05-25 DIAGNOSIS — N179 Acute kidney failure, unspecified: Secondary | ICD-10-CM | POA: Diagnosis not present

## 2020-05-25 DIAGNOSIS — N189 Chronic kidney disease, unspecified: Secondary | ICD-10-CM | POA: Diagnosis not present

## 2020-05-25 DIAGNOSIS — Z955 Presence of coronary angioplasty implant and graft: Secondary | ICD-10-CM | POA: Diagnosis not present

## 2020-06-05 ENCOUNTER — Other Ambulatory Visit: Payer: Self-pay | Admitting: Cardiology

## 2020-06-12 DIAGNOSIS — N179 Acute kidney failure, unspecified: Secondary | ICD-10-CM | POA: Diagnosis not present

## 2020-06-12 DIAGNOSIS — Z955 Presence of coronary angioplasty implant and graft: Secondary | ICD-10-CM | POA: Diagnosis not present

## 2020-06-12 DIAGNOSIS — G5601 Carpal tunnel syndrome, right upper limb: Secondary | ICD-10-CM | POA: Diagnosis not present

## 2020-06-12 DIAGNOSIS — I255 Ischemic cardiomyopathy: Secondary | ICD-10-CM | POA: Diagnosis not present

## 2020-06-12 DIAGNOSIS — I2510 Atherosclerotic heart disease of native coronary artery without angina pectoris: Secondary | ICD-10-CM | POA: Diagnosis not present

## 2020-06-12 DIAGNOSIS — Z951 Presence of aortocoronary bypass graft: Secondary | ICD-10-CM | POA: Diagnosis not present

## 2020-06-12 DIAGNOSIS — I4891 Unspecified atrial fibrillation: Secondary | ICD-10-CM | POA: Diagnosis not present

## 2020-06-13 ENCOUNTER — Encounter: Payer: Self-pay | Admitting: Cardiology

## 2020-06-13 DIAGNOSIS — I10 Essential (primary) hypertension: Secondary | ICD-10-CM | POA: Diagnosis not present

## 2020-06-13 DIAGNOSIS — E039 Hypothyroidism, unspecified: Secondary | ICD-10-CM | POA: Diagnosis not present

## 2020-06-13 DIAGNOSIS — D649 Anemia, unspecified: Secondary | ICD-10-CM | POA: Diagnosis not present

## 2020-06-15 DIAGNOSIS — I255 Ischemic cardiomyopathy: Secondary | ICD-10-CM | POA: Diagnosis not present

## 2020-06-15 DIAGNOSIS — Z951 Presence of aortocoronary bypass graft: Secondary | ICD-10-CM | POA: Diagnosis not present

## 2020-06-15 DIAGNOSIS — S40021A Contusion of right upper arm, initial encounter: Secondary | ICD-10-CM | POA: Diagnosis not present

## 2020-06-15 DIAGNOSIS — I4891 Unspecified atrial fibrillation: Secondary | ICD-10-CM | POA: Diagnosis not present

## 2020-06-15 DIAGNOSIS — Z955 Presence of coronary angioplasty implant and graft: Secondary | ICD-10-CM | POA: Diagnosis not present

## 2020-06-15 DIAGNOSIS — R7989 Other specified abnormal findings of blood chemistry: Secondary | ICD-10-CM | POA: Diagnosis not present

## 2020-06-15 DIAGNOSIS — I2510 Atherosclerotic heart disease of native coronary artery without angina pectoris: Secondary | ICD-10-CM | POA: Diagnosis not present

## 2020-06-15 DIAGNOSIS — N183 Chronic kidney disease, stage 3 unspecified: Secondary | ICD-10-CM | POA: Diagnosis not present

## 2020-06-16 ENCOUNTER — Telehealth: Payer: Self-pay | Admitting: *Deleted

## 2020-06-16 ENCOUNTER — Ambulatory Visit: Payer: Medicare Other | Admitting: Family Medicine

## 2020-06-16 NOTE — Telephone Encounter (Signed)
Patient informed and verbalized understanding of plan. Copy sent to PCP 

## 2020-06-16 NOTE — Telephone Encounter (Signed)
-----   Message from Satira Sark, MD sent at 06/15/2020  4:30 PM EST ----- Results reviewed.  Renal function somewhat better with creatinine down to 1.95, potassium high normal at 5.1.  LFTs are normal.  TSH 9.27.  We have already cut back his amiodarone to 100 mg daily at recent office visit.  Would have him stop potassium supplements.  No other changes for now.

## 2020-06-16 NOTE — Telephone Encounter (Signed)
Patient returned your call but you were not available. Advised patient the reason you called was to provide his lab results and that I would have you call him again.

## 2020-06-26 ENCOUNTER — Ambulatory Visit: Payer: Medicare Other | Admitting: Physician Assistant

## 2020-06-26 NOTE — Progress Notes (Signed)
Cardiology Office Note  Date: 06/27/2020   ID: Hunter Townsend, DOB 1941/05/22, MRN [id number]  PCP:  Curlene Labrum, MD  Cardiologist:  Rozann Lesches, MD Electrophysiologist:  None   Chief Complaint: 1 month cardiac follow-up  History of Present Illness: [name] is a 80 y.o. male with a history of atrial fibrillation, ischemic cardiomyopathy, CAD, CHF, HTN, ARF.  Last encounter with [name] 05/16/2020 for hospital follow up secondary to weakness and ARF. Crt 3.59. with improvement to 2.55 at discharge. He was hypotensive on presentation. Medications were held during hospitalization. Regimen was restarted. His Coreg had been stopped prior. Decreasing Amiodarone to 100 mg was discussed and follow up lab work was ordered. Dr Domenic Polite stated may need to stop either Entresto or diuretic. Not a candidate for Aldactone. Continuing ASA, Ranexa, and Crestor. PAF with CHA2DS2-Vasc = 5. To continue Eliquis and reduce Amiodarone to 100 mg daily.   He presents today stating he is feeling much better.   Recent lab work done at Dr. Lizbeth Bark office on 06/13/2020 showed creatinine 1.95 and GFR of 31.  Hemoglobin 11.2, hematocrit 34.  TSH 9.27.  No T3 or T4 were drawn during that visit.  He denies any weakness.  Denies any sensation of palpitations or arrhythmias.  Heart rate is 66.  Blood pressure one 100/58.  No significant dyspnea or shortness of breath.  No weight gain.  No lower extremity edema.  No PND or orthopnea.    Past Medical History:  Diagnosis Date  . Arthritis   . Atrial fibrillation ([place])   . BPH (benign prostatic hyperplasia)   . CHF (congestive heart failure) ([place])   . Coronary atherosclerosis of native coronary artery    Multivessel, NSTEMI  s/p PTCA/DES to mid LAD 06/24/11,, known patent LIMA-LAD and occlusion of all vein grafts  . Hypertension   . Ischemic cardiomyopathy    EF 35-40% by cath 06/24/11 (EF 37% by nuclear stress test 06/2010).  . Myocardial  infarction ([place]) 2013  . PAD (peripheral artery disease) (HCC)    Status post left SFA stent - Dr. Burt Knack    Past Surgical History:  Procedure Laterality Date  . BACK SURGERY     "cut me ~ 1/2inch to take sciatic nerve out of my right leg"  . CARDIAC CATHETERIZATION  03/28/2016  . CARDIAC CATHETERIZATION N/A 03/28/2016   Procedure: Left Heart Cath and Cors/Grafts Angiography;  Surgeon: Leonie Man, MD;  Location: [place] CV LAB;  Service: Cardiovascular;  Laterality: N/A;  . CARDIOVERSION N/A 09/21/2019   Procedure: CARDIOVERSION;  Surgeon: Skeet Latch, MD;  Location: [place];  Service: Cardiovascular;  Laterality: N/A;  . CATARACT EXTRACTION W/ INTRAOCULAR LENS  IMPLANT, BILATERAL    . CORONARY ANGIOPLASTY    . CORONARY ARTERY BYPASS GRAFT  1987   [place], [place] - LIMA to LAD, SVG to RCA presumably  . LEFT HEART CATH AND CORS/GRAFTS ANGIOGRAPHY N/A 05/12/2017   Procedure: LEFT HEART CATH AND CORS/GRAFTS ANGIOGRAPHY;  Surgeon: Burnell Blanks, MD;  Location: [place] CV LAB;  Service: Cardiovascular;  Laterality: N/A;  . LEFT HEART CATHETERIZATION WITH CORONARY/GRAFT ANGIOGRAM  06/24/2011   Procedure: LEFT HEART CATHETERIZATION WITH Beatrix Fetters;  Surgeon: Burnell Blanks, MD;  Location: [facility] CATH LAB;  Service: Cardiovascular;;  . LEFT HEART CATHETERIZATION WITH CORONARY/GRAFT ANGIOGRAM N/A 02/26/2013   Procedure: LEFT HEART CATHETERIZATION WITH Beatrix Fetters;  Surgeon: Blane Ohara, MD;  Location: [facility] CATH LAB;  Service: Cardiovascular;  Laterality: N/A;  . LOWER EXTREMITY ANGIOGRAM N/A 10/30/2011   Procedure: LOWER EXTREMITY ANGIOGRAM;  Surgeon: Sherren Mocha, MD;  Location: [facility] CATH LAB;  Service: Cardiovascular;  Laterality: N/A;  . PERCUTANEOUS CORONARY STENT INTERVENTION (PCI-S)  02/26/2013   Procedure: PERCUTANEOUS CORONARY STENT INTERVENTION (PCI-S);  Surgeon: Blane Ohara, MD;  Location: [facility] CATH LAB;  Service:  Cardiovascular;;    Current Outpatient Medications  Medication Sig Dispense Refill  . acetaminophen (TYLENOL) 325 MG tablet Take 650 mg by mouth every 12 (twelve) hours as needed for moderate pain or headache.     Marland Kitchen amiodarone (PACERONE) 200 MG tablet Take 0.5 tablets (100 mg total) by mouth daily. 90 tablet 3  . aspirin EC 81 MG tablet Take 1 tablet (81 mg total) by mouth daily. 90 tablet 3  . ELIQUIS 2.5 MG TABS tablet TAKE (1) TABLET TWICE DAILY. 60 tablet 6  . furosemide (LASIX) 40 MG tablet TAKE 1 TABLET ONCE DAILY. 90 tablet 1  . gabapentin (NEURONTIN) 100 MG capsule Take 100 mg by mouth 3 (three) times daily.     Marland Kitchen LORazepam (ATIVAN) 1 MG tablet Take 1 mg by mouth 2 (two) times daily.    . Multiple Vitamin (MULTIVITAMIN) tablet Take 1 tablet by mouth daily.    . nitroGLYCERIN (NITROSTAT) 0.4 MG SL tablet Place 1 tablet (0.4 mg total) under the tongue every 5 (five) minutes x 3 doses as needed for chest pain (if no relief after 3rd dose, proceed to the ED for an evaluation or call 911). For chest pain 25 tablet 3  . Omega-3 Fatty Acids (FISH OIL) 1000 MG CAPS Take 2,000 mg by mouth daily.    Marland Kitchen OVER THE COUNTER MEDICATION Place 1 application rectally as needed (constipation). Suppository to help with BM with Occasional fleet enema    . pantoprazole (PROTONIX) 40 MG tablet Take 40 mg by mouth every evening.    . ranolazine (RANEXA) 500 MG 12 hr tablet Take 1 tablet (500 mg total) by mouth every 12 (twelve) hours. 180 tablet 3  . rosuvastatin (CRESTOR) 5 MG tablet Take 5 mg by mouth daily.    . sacubitril-valsartan (ENTRESTO) 24-26 MG Take 1 tablet by mouth 2 (two) times daily. 60 tablet 6  . triamcinolone cream (KENALOG) 0.1 % Apply 1 application topically daily as needed (rash).      No current facility-administered medications for this visit.   Allergies:  Patient has no known allergies.   Social History: The patient  reports that he quit smoking about 27 years ago. His smoking use  included cigarettes. He has a 80.00 pack-year smoking history. He has never used smokeless tobacco. He reports that he does not drink alcohol and does not use drugs.   Family History: The patient's family history includes CAD in an other family member; Hypertension in an other family member.   ROS:  Please see the history of present illness. Otherwise, complete review of systems is positive for none.  All other systems are reviewed and negative.   Physical Exam: VS:  BP (!) 100/58   Pulse 66   Ht 5\' 5"  (1.651 m)   Wt 146 lb 9.6 oz (66.5 kg)   SpO2 99%   BMI 24.40 kg/m , BMI Body mass index is 24.4 kg/m.  Wt Readings from Last 3 Encounters:  06/27/20 146 lb 9.6 oz (66.5 kg)  05/16/20 151 lb (68.5 kg)  02/10/20 152 lb (68.9 kg)    General: Patient appears comfortable at rest.  Neck: Supple, no elevated JVP or carotid bruits, no thyromegaly. Lungs: Clear to auscultation, nonlabored breathing at rest. Cardiac: Regular rate and rhythm, no S3 or significant systolic murmur, no pericardial rub. Extremities: No pitting edema, distal pulses 2+. Skin: Warm and dry. Musculoskeletal: No kyphosis. Neuropsychiatric: Alert and oriented x3, affect grossly appropriate.  ECG: His EKG was noted to be slow atrial fibrillation on 05/10/2020 with normal axis and no acute ischemic changes with a ventricular rate of 40 in the emergency department at [place]: 10/15/2019: TSH 1.476 12/19/2019: ALT 19; AST 24; Hemoglobin 9.9; Platelets 230 05/16/2020: BUN 32; Creatinine, Ser 1.84; Potassium 3.9; Sodium 141     Component Value Date/Time   CHOL 152 06/25/2011 0400   TRIG 150 (H) 06/25/2011 0400   HDL 28 (L) 06/25/2011 0400   CHOLHDL 5.4 06/25/2011 0400   VLDL 30 06/25/2011 0400   LDLCALC 94 06/25/2011 0400    Other Studies Reviewed Today:  Echocardiogram 02/24/2020: 1. Left ventricular ejection fraction, by estimation, is 30 to 35%. The  left ventricle has moderately  decreased function. The left ventricle  demonstrates regional wall motion abnormalities (see scoring  diagram/findings for description). The left  ventricular internal cavity size was mildly dilated. Left ventricular  diastolic parameters are consistent with Grade II diastolic dysfunction  (pseudonormalization).  2. Right ventricular systolic function is normal. The right ventricular  size is normal. There is moderately elevated pulmonary artery systolic  pressure. The estimated right ventricular systolic pressure is 77.4 mmHg.  3. Left atrial size was mildly dilated.  4. Right atrial size was moderately dilated.  5. Ascites and right pleural effusion noted.  6. The mitral valve is grossly normal. Moderate mitral valve  regurgitation.  7. Tricuspid valve regurgitation is moderate to severe.  8. The aortic valve is tricuspid. Aortic valve regurgitation is trivial.  Aortic regurgitation PHT measures 1097 msec.  9. The inferior vena cava is dilated in size with <50% respiratory  variability, suggesting right atrial pressure of 15 mmHg.   Carotid Dopplers 02/24/2020: Summary:  Right Carotid: Velocities in the right ICA are consistent with a 40-59% stenosis.   Left Carotid: Velocities in the left ICA are consistent with a 40-59% stenosis.   Vertebrals: Bilateral vertebral arteries demonstrate antegrade flow.  Subclavians: Normal flow hemodynamics were seen in bilateral subclavian arteries  Assessment and Plan:  1. Ischemic cardiomyopathy   2. CAD in native artery   3. Stage 3 chronic kidney disease, unspecified whether stage 3a or 3b CKD (HCC)   4. Paroxysmal atrial fibrillation ([place])    1. Ischemic cardiomyopathy Continue Entresto 24/26 mg p.o. twice daily.  Decrease Lasix to 20 mg every other day.  Recent lab at PCP office showed creatinine 1.95 and GFR of 31.  We are decreasing Lasix to 20 mg every other day. Continue Entresto 24/26 mg p.o. twice daily.  Get basic  metabolic panel and magnesium in 2 weeks. Advised patient to continue to restrict sodium.  Weigh every day and report increase of 3 pounds in 24 hours or 5 pounds in 1 week.  Restrict fluids to approximately 60 ounces per day.  He verbalizes understanding.  2. CAD in native artery Denies any current anginal or exertional symptoms.  Continue aspirin 81 mg daily.  Continue sublingual nitroglycerin as needed.  Continue Ranexa 500 mg p.o. twice daily.  Continue Crestor 5 mg p.o. daily.  3. Stage 3 chronic kidney disease, unspecified whether stage 3a or 3b CKD (HCC) Recent creatinine  1.95 and GFR 31 that PCP office on 06/13/2020.  We will recheck BMP and magnesium in 2 weeks after decreasing Lasix dosage.  4. Paroxysmal atrial fibrillation (HCC) Pulse is irregularly irregular at 66 today.  Recent thyroid studies showed increase of TSH to 9.7 at PCP office.  We will obtain T3 and T4 in 2 weeks when BMP and magnesium are drawn.  Continue amiodarone 100 mg by mouth for now.  5.  Hypothyroidism  Recent TSH 9.27 at Dr. Lizbeth Bark office.  Get T3 and T4 in 2 weeks when basic metabolic panel and magnesium are due to be drawn to reassess.   Medication Adjustments/Labs and Tests Ordered: Current medicines are reviewed at length with the patient today.  Concerns regarding medicines are outlined above.   Disposition: Follow-up with [name] or APP 1 month  Signed, Levell July, NP 06/27/2020 2:55 PM    [place] at [place], [place], [place] [address] Phone: [phone]; Fax: [phone]

## 2020-06-27 ENCOUNTER — Encounter: Payer: Self-pay | Admitting: Family Medicine

## 2020-06-27 ENCOUNTER — Ambulatory Visit (INDEPENDENT_AMBULATORY_CARE_PROVIDER_SITE_OTHER): Payer: Medicare Other | Admitting: Family Medicine

## 2020-06-27 VITALS — BP 100/58 | HR 66 | Ht 65.0 in | Wt 146.6 lb

## 2020-06-27 DIAGNOSIS — I255 Ischemic cardiomyopathy: Secondary | ICD-10-CM | POA: Diagnosis not present

## 2020-06-27 DIAGNOSIS — N183 Chronic kidney disease, stage 3 unspecified: Secondary | ICD-10-CM | POA: Diagnosis not present

## 2020-06-27 DIAGNOSIS — I2510 Atherosclerotic heart disease of native coronary artery without angina pectoris: Secondary | ICD-10-CM | POA: Diagnosis not present

## 2020-06-27 DIAGNOSIS — I480 Paroxysmal atrial fibrillation: Secondary | ICD-10-CM

## 2020-06-27 DIAGNOSIS — Z79899 Other long term (current) drug therapy: Secondary | ICD-10-CM

## 2020-06-27 DIAGNOSIS — I10 Essential (primary) hypertension: Secondary | ICD-10-CM

## 2020-06-27 MED ORDER — FUROSEMIDE 20 MG PO TABS
20.0000 mg | ORAL_TABLET | ORAL | 3 refills | Status: DC
Start: 2020-06-27 — End: 2021-06-12

## 2020-06-27 NOTE — Patient Instructions (Addendum)
Medication Instructions:   Decrease Lasix to 20mg  every other day.   Continue the Crestor 5mg  daily.  Continue all other medications.    Labwork:  BMET, MG, T4, T3 - orders given today.   Please do labs in about 2 weeks.   Office will contact with results via phone or letter.    Testing/Procedures: none  Follow-Up: 1 month   Any Other Special Instructions Will Be Listed Below (If Applicable).  If you need a refill on your cardiac medications before your next appointment, please call your pharmacy.

## 2020-07-07 ENCOUNTER — Ambulatory Visit: Payer: Medicare Other | Admitting: Cardiology

## 2020-07-17 ENCOUNTER — Telehealth: Payer: Self-pay | Admitting: *Deleted

## 2020-07-17 NOTE — Telephone Encounter (Signed)
Laurine Blazer, LPN  07/07/1733 6:70 PM EST Back to Top     Left message to return call.

## 2020-07-17 NOTE — Telephone Encounter (Signed)
-----   Message from Verta Ellen., NP sent at 07/13/2020  4:51 PM EST ----- Recent lab work on 07/11/2020 at primary care office: Glucose was 123, creatinine 1.8, GFR 35.  Magnesium 2.0, T4 1.31   Previous lab work done at Dr. Lizbeth Bark office on 06/13/2020 showed creatinine 1.95 and GFR of 31.  Kidney function is improving slightly.   No changes to therapy for now.

## 2020-07-18 NOTE — Telephone Encounter (Signed)
Returned call to patient Hunter Townsend July NP reviewed lab recently done at Dr.Burdine's office.His advice given.

## 2020-07-18 NOTE — Telephone Encounter (Signed)
Returning call.

## 2020-07-19 NOTE — Progress Notes (Signed)
Cardiology Office Note  Date: 07/20/2020   ID: Hunter Townsend, Hunter Townsend 03/11/1941, MRN [id number]  PCP:  Curlene Labrum, MD  Cardiologist:  Rozann Lesches, MD Electrophysiologist:  None   Chief Complaint: 1 month cardiac follow-up  History of Present Illness: Hunter Townsend is a 80 y.o. male with a history of atrial fibrillation, ischemic cardiomyopathy, CAD, CHF, HTN, ARF.  Prior encounter with [name] 05/16/2020 for hospital follow up secondary to weakness and ARF. Crt 3.59. with improvement to 2.55 at discharge. He was hypotensive on presentation. Medications were held during hospitalization. Regimen was restarted. His Coreg had been stopped prior. Decreasing Amiodarone to 100 mg was discussed and follow up lab work was ordered. Dr Domenic Polite stated may need to stop either Entresto or diuretic. Not a candidate for Aldactone. Continuing ASA, Ranexa, and Crestor. PAF with CHA2DS2-Vasc = 5. To continue Eliquis and reduce Amiodarone to 100 mg daily.   He presents today for 1 month follow-up.  States he is feeling good.  He has been walking around [place] daily for exercise.  He states the only recent complaint he has is feeling cold all the time and attributes this to the blood thinners he is taking.  He denies any bleeding issues on Eliquis.  Denies any anginal or exertional symptoms, dyspnea on exertion, orthostatic symptoms, CVA or TIA-like symptoms, PND, orthopnea, lower extremity edema.  Blood pressure today 114/50, heart rate 66.  Past Medical History:  Diagnosis Date  . Arthritis   . Atrial fibrillation ([place])   . BPH (benign prostatic hyperplasia)   . CHF (congestive heart failure) ([place])   . Coronary atherosclerosis of native coronary artery    Multivessel, NSTEMI  s/p PTCA/DES to mid LAD 06/24/11,, known patent LIMA-LAD and occlusion of all vein grafts  . Hypertension   . Ischemic cardiomyopathy    EF 35-40% by cath 06/24/11 (EF 37% by nuclear stress test 06/2010).  .  Myocardial infarction ([place]) 2013  . PAD (peripheral artery disease) (H[facility])    Status post left SFA stent - Dr. Burt Knack    Past Surgical History:  Procedure Laterality Date  . BACK SURGERY     "cut me ~ 1/2inch to take sciatic nerve out of my right leg"  . CARDIAC CATHETERIZATION  03/28/2016  . CARDIAC CATHETERIZATION N/A 03/28/2016   Procedure: Left Heart Cath and Cors/Grafts Angiography;  Surgeon: Leonie Man, MD;  Location: [facility];  Service: Cardiovascular;  Laterality: N/A;  . CARDIOVERSION N/A 09/21/2019   Procedure: CARDIOVERSION;  Surgeon: Skeet Latch, MD;  Location: [facility];  Service: Cardiovascular;  Laterality: N/A;  . CATARACT EXTRACTION W/ INTRAOCULAR LENS  IMPLANT, BILATERAL    . CORONARY ANGIOPLASTY    . CORONARY ARTERY BYPASS GRAFT  1987   [place] - LIMA to LAD, SVG to RCA presumably  . LEFT HEART CATH AND CORS/GRAFTS ANGIOGRAPHY N/A 05/12/2017   Procedure: LEFT HEART CATH AND CORS/GRAFTS ANGIOGRAPHY;  Surgeon: Burnell Blanks, MD;  Location: [facility];  Service: Cardiovascular;  Laterality: N/A;  . LEFT HEART CATHETERIZATION WITH CORONARY/GRAFT ANGIOGRAM  06/24/2011   Procedure: LEFT HEART CATHETERIZATION WITH Beatrix Fetters;  Surgeon: Burnell Blanks, MD;  Location: [facility] CATH LAB;  Service: Cardiovascular;;  . LEFT HEART CATHETERIZATION WITH CORONARY/GRAFT ANGIOGRAM N/A 02/26/2013   Procedure: LEFT HEART CATHETERIZATION WITH Beatrix Fetters;  Surgeon: Blane Ohara, MD;  Location: [facility] CATH LAB;  Service: Cardiovascular;  Laterality: N/A;  . LOWER EXTREMITY ANGIOGRAM N/A  10/30/2011   Procedure: LOWER EXTREMITY ANGIOGRAM;  Surgeon: Sherren Mocha, MD;  Location: [facility] CATH LAB;  Service: Cardiovascular;  Laterality: N/A;  . PERCUTANEOUS CORONARY STENT INTERVENTION (PCI-S)  02/26/2013   Procedure: PERCUTANEOUS CORONARY STENT INTERVENTION (PCI-S);  Surgeon: Blane Ohara, MD;  Location: [facility] CATH LAB;  Service:  Cardiovascular;;    Current Outpatient Medications  Medication Sig Dispense Refill  . acetaminophen (TYLENOL) 325 MG tablet Take 650 mg by mouth every 12 (twelve) hours as needed for moderate pain or headache.     Marland Kitchen amiodarone (PACERONE) 200 MG tablet Take 0.5 tablets (100 mg total) by mouth daily. 90 tablet 3  . aspirin EC 81 MG tablet Take 1 tablet (81 mg total) by mouth daily. 90 tablet 3  . ELIQUIS 2.5 MG TABS tablet TAKE (1) TABLET TWICE DAILY. 60 tablet 6  . furosemide (LASIX) 20 MG tablet Take 1 tablet (20 mg total) by mouth every other day. 45 tablet 3  . gabapentin (NEURONTIN) 100 MG capsule Take 100 mg by mouth 3 (three) times daily.     Marland Kitchen LORazepam (ATIVAN) 1 MG tablet Take 1 mg by mouth 2 (two) times daily.    . Multiple Vitamin (MULTIVITAMIN) tablet Take 1 tablet by mouth daily.    . nitroGLYCERIN (NITROSTAT) 0.4 MG SL tablet Place 1 tablet (0.4 mg total) under the tongue every 5 (five) minutes x 3 doses as needed for chest pain (if no relief after 3rd dose, proceed to the ED for an evaluation or call 911). For chest pain 25 tablet 3  . Omega-3 Fatty Acids (FISH OIL) 1000 MG CAPS Take 2,000 mg by mouth daily.    Marland Kitchen OVER THE COUNTER MEDICATION Place 1 application rectally as needed (constipation). Suppository to help with BM with Occasional fleet enema    . pantoprazole (PROTONIX) 40 MG tablet Take 40 mg by mouth every evening.    . ranolazine (RANEXA) 500 MG 12 hr tablet Take 1 tablet (500 mg total) by mouth every 12 (twelve) hours. 180 tablet 3  . rosuvastatin (CRESTOR) 5 MG tablet Take 5 mg by mouth daily.    . sacubitril-valsartan (ENTRESTO) 24-26 MG Take 1 tablet by mouth 2 (two) times daily. 60 tablet 6  . triamcinolone cream (KENALOG) 0.1 % Apply 1 application topically daily as needed (rash).      No current facility-administered medications for this visit.   Allergies:  Patient has no known allergies.   Social History: The patient  reports that he quit smoking about 27  years ago. His smoking use included cigarettes. He has a 80.00 pack-year smoking history. He has never used smokeless tobacco. He reports that he does not drink alcohol and does not use drugs.   Family History: The patient's family history includes CAD in an other family member; Hypertension in an other family member.   ROS:  Please see the history of present illness. Otherwise, complete review of systems is positive for none.  All other systems are reviewed and negative.   Physical Exam: VS:  BP (!) 114/50   Pulse 66   Ht 5\' 4"  (1.626 m)   Wt 150 lb 3.2 oz (68.1 kg)   SpO2 97%   BMI 25.78 kg/m , BMI Body mass index is 25.78 kg/m.  Wt Readings from Last 3 Encounters:  07/20/20 150 lb 3.2 oz (68.1 kg)  06/27/20 146 lb 9.6 oz (66.5 kg)  05/16/20 151 lb (68.5 kg)    General: Patient appears comfortable at rest.  Neck: Supple, no elevated JVP or carotid bruits, no thyromegaly. Lungs: Clear to auscultation, nonlabored breathing at rest. Cardiac: Regular rate and rhythm, no S3 or significant systolic murmur, no pericardial rub. Extremities: No pitting edema, distal pulses 2+. Skin: Warm and dry. Musculoskeletal: No kyphosis. Neuropsychiatric: Alert and oriented x3, affect grossly appropriate.  ECG: Last EKG was noted to be slow atrial fibrillation on 05/10/2020 with normal axis and no acute ischemic changes with a ventricular rate of 40 in the emergency department at [facility]: 10/15/2019: TSH 1.476 12/19/2019: ALT 19; AST 24; Hemoglobin 9.9; Platelets 230 05/16/2020: BUN 32; Creatinine, Ser 1.84; Potassium 3.9; Sodium 141     Component Value Date/Time   CHOL 152 06/25/2011 0400   TRIG 150 (H) 06/25/2011 0400   HDL 28 (L) 06/25/2011 0400   CHOLHDL 5.4 06/25/2011 0400   VLDL 30 06/25/2011 0400   LDLCALC 94 06/25/2011 0400    Other Studies Reviewed Today:  Echocardiogram 02/24/2020: 1. Left ventricular ejection fraction, by estimation, is 30 to 35%. The   left ventricle has moderately decreased function. The left ventricle  demonstrates regional wall motion abnormalities (see scoring  diagram/findings for description). The left  ventricular internal cavity size was mildly dilated. Left ventricular  diastolic parameters are consistent with Grade II diastolic dysfunction  (pseudonormalization).  2. Right ventricular systolic function is normal. The right ventricular  size is normal. There is moderately elevated pulmonary artery systolic  pressure. The estimated right ventricular systolic pressure is 39.7 mmHg.  3. Left atrial size was mildly dilated.  4. Right atrial size was moderately dilated.  5. Ascites and right pleural effusion noted.  6. The mitral valve is grossly normal. Moderate mitral valve  regurgitation.  7. Tricuspid valve regurgitation is moderate to severe.  8. The aortic valve is tricuspid. Aortic valve regurgitation is trivial.  Aortic regurgitation PHT measures 1097 msec.  9. The inferior vena cava is dilated in size with <50% respiratory  variability, suggesting right atrial pressure of 15 mmHg.   Carotid Dopplers 02/24/2020: Summary:  Right Carotid: Velocities in the right ICA are consistent with a 40-59% stenosis.   Left Carotid: Velocities in the left ICA are consistent with a 40-59% stenosis.   Vertebrals: Bilateral vertebral arteries demonstrate antegrade flow.  Subclavians: Normal flow hemodynamics were seen in bilateral subclavian arteries  Assessment and Plan:  1. Ischemic cardiomyopathy No dyspnea on exertion or lower extremity edema.  Has approximately 4 pound weight gain since last visit 2-1/2 weeks ago.  Lungs clear to auscultation.  Denies any significant dyspnea on exertion but is not very active on a daily basis other than walking in [place] for exercise during the week.  Continue Entresto 24/26 mg p.o. twice daily. Recent lab work on 07/11/2020 showed glucose 123, creatinine 1.8, GFR 35,  magnesium 2.0, T4 1.31.  Continue Lasix to 20 mg every other day. Continue Entresto 24/26 mg p.o. twice daily.   Advised patient to continue to restrict sodium.  Weigh every day and report increase of 3 pounds in 24 hours or 5 pounds in 1 week.  Restrict fluids to approximately 60 ounces per day.  He verbalizes understanding.  2. CAD in native artery Denies any current anginal or exertional symptoms.  Continue aspirin 81 mg daily.  Continue sublingual nitroglycerin as needed.  Continue Ranexa 500 mg p.o. twice daily.  Continue Crestor 5 mg p.o. daily.  3. Stage 3 chronic kidney disease, unspecified whether stage 3a or 3b CKD ([place]) Recent  creatinine 1.95 and GFR 31 that PCP office on 06/13/2020.  More recent lab work Recent lab work on 07/11/2020 showed glucose 123, creatinine 1.8, GFR 35, magnesium 2.0, T4 1.31    Medication Adjustments/Labs and Tests Ordered: Current medicines are reviewed at length with the patient today.  Concerns regarding medicines are outlined above.   Disposition: Follow-up with [name] or APP 6 months Signed, Levell July, NP 07/20/2020 1:37 PM    [facility] at [place], [place], [place] [address] Phone: [phone]; Fax: [phone]

## 2020-07-20 ENCOUNTER — Encounter: Payer: Self-pay | Admitting: Family Medicine

## 2020-07-20 ENCOUNTER — Ambulatory Visit (INDEPENDENT_AMBULATORY_CARE_PROVIDER_SITE_OTHER): Payer: Medicare Other | Admitting: Family Medicine

## 2020-07-20 VITALS — BP 114/50 | HR 66 | Ht 64.0 in | Wt 150.2 lb

## 2020-07-20 DIAGNOSIS — N183 Chronic kidney disease, stage 3 unspecified: Secondary | ICD-10-CM

## 2020-07-20 DIAGNOSIS — I255 Ischemic cardiomyopathy: Secondary | ICD-10-CM | POA: Diagnosis not present

## 2020-07-20 DIAGNOSIS — I2510 Atherosclerotic heart disease of native coronary artery without angina pectoris: Secondary | ICD-10-CM | POA: Diagnosis not present

## 2020-07-20 NOTE — Patient Instructions (Signed)
Medication Instructions:  Continue all current medications.   Labwork: none  Testing/Procedures: none  Follow-Up: 6 months   Any Other Special Instructions Will Be Listed Below (If Applicable).   If you need a refill on your cardiac medications before your next appointment, please call your pharmacy.  

## 2020-07-28 ENCOUNTER — Other Ambulatory Visit (HOSPITAL_COMMUNITY): Payer: Self-pay | Admitting: Family Medicine

## 2020-07-28 NOTE — Telephone Encounter (Signed)
Prescription refill request for Eliquis received. Indication: atrial fibrillation Last office visit: 2/22 Leonides Sake Scr:1.8 2/22 Age: 80 Weight:68.1 kg  Prescription refilled

## 2020-10-02 ENCOUNTER — Other Ambulatory Visit: Payer: Self-pay | Admitting: Cardiology

## 2020-11-24 ENCOUNTER — Encounter: Payer: Self-pay | Admitting: Family Medicine

## 2021-01-16 ENCOUNTER — Other Ambulatory Visit: Payer: Self-pay | Admitting: Cardiology

## 2021-01-25 ENCOUNTER — Other Ambulatory Visit: Payer: Self-pay | Admitting: Cardiology

## 2021-01-25 NOTE — Telephone Encounter (Signed)
Prescription refill request for Eliquis received. Indication: PAF Last office visit:  07/20/20  Lawanda Cousins FNP Scr: 1.92 on 11/24/20 Age: 80 Weight: 68.1kg  Based on above findings Eliquis 2.'5mg'$  twice daily is the appropriate dose.  Refill approved.

## 2021-01-26 ENCOUNTER — Encounter: Payer: Self-pay | Admitting: *Deleted

## 2021-01-29 NOTE — Progress Notes (Signed)
Cardiology Office Note  Date: 01/30/2021   ID: Hunter Townsend, DOB 06/11/1940, MRN [id number]  PCP:  Curlene Labrum, MD  Cardiologist:  Rozann Lesches, MD Electrophysiologist:  None   Chief Complaint  Patient presents with   Shortness of Breath   Dizziness     History of Present Illness: Hunter Townsend is an 80 y.o. male last seen in February by Mr. Leonides Sake NP.  He presents for a follow-up visit.  He tells me that he feels poorly, no angina symptoms or palpitations, but significantly short of breath with activities and also orthostatic dizziness.  He describes NYHA class III dyspnea.  He has not had to use any nitroglycerin and otherwise reports compliance with his medications.  Medical therapy has been limited by both bradycardia and hypotension as well as CKD stage IIIb.  Heart rate is up today in sinus rhythm.  We have kept him on low-dose amiodarone for rhythm suppression of atrial fibrillation, he also remains on low-dose Eliquis.  CHA2DS2-VASc score is 5.  Echocardiogram in September 2021 revealed LVEF 30 to 35% with wall motion abnormalities consistent with ischemic cardiomyopathy, normal RV contraction with moderately elevated RVSP, moderate mitral regurgitation, and moderate to severe tricuspid regurgitation.  Orthostatic measurements were made today.  Not diagnostic, but his systolic was in the 0000000 and symptomatic with sitting and standing.  Past Medical History:  Diagnosis Date   Arthritis    Atrial fibrillation (HCC)    BPH (benign prostatic hyperplasia)    CHF (congestive heart failure) (H[facility])    Coronary atherosclerosis of native coronary artery    Multivessel, NSTEMI  s/p PTCA/DES to mid LAD 06/24/11,, known patent LIMA-LAD and occlusion of all vein grafts   Hypertension    Ischemic cardiomyopathy    EF 35-40% by cath 06/24/11 (EF 37% by nuclear stress test 06/2010).   Myocardial infarction [facility]) 2013   PAD (peripheral artery disease) ([place])    Status post  left SFA stent - Dr. Burt Knack    Past Surgical History:  Procedure Laterality Date   BACK SURGERY     "cut me ~ 1/2inch to take sciatic nerve out of my right leg"   CARDIAC CATHETERIZATION  03/28/2016   CARDIAC CATHETERIZATION N/A 03/28/2016   Procedure: Left Heart Cath and Cors/Grafts Angiography;  Surgeon: Leonie Man, MD;  Location: [place] [facility];  Service: Cardiovascular;  Laterality: N/A;   CARDIOVERSION N/A 09/21/2019   Procedure: CARDIOVERSION;  Surgeon: Skeet Latch, MD;  Location: [place];  Service: Cardiovascular;  Laterality: N/A;   CATARACT EXTRACTION W/ INTRAOCULAR LENS  IMPLANT, BILATERAL     CORONARY ANGIOPLASTY     CORONARY ARTERY [place], [place] - LIMA to LAD, SVG to RCA presumably   LEFT HEART CATH AND CORS/GRAFTS ANGIOGRAPHY N/A 05/12/2017   Procedure: LEFT HEART CATH AND CORS/GRAFTS ANGIOGRAPHY;  Surgeon: Burnell Blanks, MD;  Location: [facility];  Service: Cardiovascular;  Laterality: N/A;   LEFT HEART CATHETERIZATION WITH CORONARY/GRAFT ANGIOGRAM  06/24/2011   Procedure: LEFT HEART CATHETERIZATION WITH Beatrix Fetters;  Surgeon: Burnell Blanks, MD;  Location: [facility] CATH LAB;  Service: Cardiovascular;;   LEFT HEART CATHETERIZATION WITH CORONARY/GRAFT ANGIOGRAM N/A 02/26/2013   Procedure: LEFT HEART CATHETERIZATION WITH Beatrix Fetters;  Surgeon: Blane Ohara, MD;  Location: [facility] CATH LAB;  Service: Cardiovascular;  Laterality: N/A;   LOWER EXTREMITY ANGIOGRAM N/A 10/30/2011   Procedure: LOWER EXTREMITY ANGIOGRAM;  Surgeon: Sherren Mocha, MD;  Location: [facility] CATH LAB;  Service: Cardiovascular;  Laterality: N/A;   PERCUTANEOUS CORONARY STENT INTERVENTION (PCI-S)  02/26/2013   Procedure: PERCUTANEOUS CORONARY STENT INTERVENTION (PCI-S);  Surgeon: Blane Ohara, MD;  Location: [facility] CATH LAB;  Service: Cardiovascular;;    Current Outpatient Medications  Medication Sig Dispense Refill   acetaminophen  (TYLENOL) 325 MG tablet Take 650 mg by mouth every 12 (twelve) hours as needed for moderate pain or headache.      amiodarone (PACERONE) 200 MG tablet Take 0.5 tablets (100 mg total) by mouth daily. 90 tablet 3   apixaban (ELIQUIS) 2.5 MG TABS tablet TAKE (1) TABLET TWICE DAILY. 60 tablet 5   aspirin EC 81 MG tablet Take 1 tablet (81 mg total) by mouth daily. 90 tablet 3   ENTRESTO 24-26 MG TAKE (1) TABLET TWICE DAILY. 60 tablet 6   furosemide (LASIX) 20 MG tablet Take 1 tablet (20 mg total) by mouth every other day. 45 tablet 3   gabapentin (NEURONTIN) 100 MG capsule Take 100 mg by mouth 3 (three) times daily.      latanoprost (XALATAN) 0.005 % ophthalmic solution Place 0.0005 drops into the left eye at bedtime as needed.     LORazepam (ATIVAN) 1 MG tablet Take 1 mg by mouth 2 (two) times daily.     midodrine (PROAMATINE) 5 MG tablet Take 1 tablet (5 mg total) by mouth 2 (two) times daily with a meal. 60 tablet 2   Multiple Vitamin (MULTIVITAMIN) tablet Take 1 tablet by mouth daily.     nitroGLYCERIN (NITROSTAT) 0.4 MG SL tablet Place 1 tablet (0.4 mg total) under the tongue every 5 (five) minutes x 3 doses as needed for chest pain (if no relief after 3rd dose, proceed to the ED for an evaluation or call 911). For chest pain 25 tablet 3   Omega-3 Fatty Acids (FISH OIL) 1000 MG CAPS Take 2,000 mg by mouth daily.     OVER THE COUNTER MEDICATION Place 1 application rectally as needed (constipation). Suppository to help with BM with Occasional fleet enema     pantoprazole (PROTONIX) 40 MG tablet Take 40 mg by mouth every evening.     ranolazine (RANEXA) 500 MG 12 hr tablet TAKE 1 TABLET EVERY 12 HOURS. 180 tablet 0   rosuvastatin (CRESTOR) 5 MG tablet Take 5 mg by mouth daily.     triamcinolone cream (KENALOG) 0.1 % Apply 1 application topically daily as needed (rash).      No current facility-administered medications for this visit.   Allergies:  Patient has no known allergies.   ROS: No  orthopnea or PND.  Physical Exam: VS:  BP (!) 98/59 (BP Location: Right Arm, Cuff Size: Normal)   Pulse 83   Ht '5\' 4"'$  (1.626 m)   Wt 145 lb 9.6 oz (66 kg)   SpO2 98%   BMI 24.99 kg/m , BMI Body mass index is 24.99 kg/m.  Wt Readings from Last 3 Encounters:  01/30/21 145 lb 9.6 oz (66 kg)  07/20/20 150 lb 3.2 oz (68.1 kg)  06/27/20 146 lb 9.6 oz (66.5 kg)    General: Elderly male, no distress. HEENT: Conjunctiva and lids normal, wearing a mask. Neck: Supple, no elevated JVP or carotid bruits, no thyromegaly. Lungs: Clear to auscultation, nonlabored breathing at rest. Cardiac: Regular rate and rhythm, no S3, 1/6 systolic murmur, no pericardial rub. Abdomen: Soft, bowel sounds present. Extremities: No pitting edema, distal pulses 2+.  Scattered ecchymoses on the forearms.  ECG:  An ECG dated 11/24/2020 was personally reviewed today and demonstrated:  Sinus rhythm with prolonged PR interval and IVCD.  Recent Labwork: 05/16/2020: BUN 32; Creatinine, Ser 1.84; Potassium 3.9; Sodium 141  July 2022: Hemoglobin 9.4, platelets 207, potassium 4.7, BUN 29, creatinine 1.92, AST 16, ALT 16, magnesium 1.9  Other Studies Reviewed Today:  Echocardiogram 02/24/2020:  1. Left ventricular ejection fraction, by estimation, is 30 to 35%. The  left ventricle has moderately decreased function. The left ventricle  demonstrates regional wall motion abnormalities (see scoring  diagram/findings for description). The left  ventricular internal cavity size was mildly dilated. Left ventricular  diastolic parameters are consistent with Grade II diastolic dysfunction  (pseudonormalization).   2. Right ventricular systolic function is normal. The right ventricular  size is normal. There is moderately elevated pulmonary artery systolic  pressure. The estimated right ventricular systolic pressure is [id number] mmHg.   3. Left atrial size was mildly dilated.   4. Right atrial size was moderately dilated.   5.  Ascites and right pleural effusion noted.   6. The mitral valve is grossly normal. Moderate mitral valve  regurgitation.   7. Tricuspid valve regurgitation is moderate to severe.   8. The aortic valve is tricuspid. Aortic valve regurgitation is trivial.  Aortic regurgitation PHT measures 1097 msec.   9. The inferior vena cava is dilated in size with <50% respiratory  variability, suggesting right atrial pressure of 15 mmHg.   Carotid Dopplers 02/24/2020: Summary:  Right Carotid: Velocities in the right ICA are consistent with a 40-59%                 stenosis.   Left Carotid: Velocities in the left ICA are consistent with a 40-59%  stenosis.   Vertebrals:  Bilateral vertebral arteries demonstrate antegrade flow.  Subclavians: Normal flow hemodynamics were seen in bilateral subclavian               arteries.   Assessment and Plan:  1.  Chronic combined heart failure with underlying ischemic cardiomyopathy, LVEF 30 to 35% and moderate diastolic dysfunction, also moderately elevated RVSP by last assessment in September 2021.  Medical therapy has been limited due to intermittent hypotension and bradycardia, also fluctuating renal insufficiency.  I am concerned about low output situation, he reports NYHA class III dyspnea on present regimen which includes Entresto and Lasix.  He has not been on Aldactone with fluctuating renal insufficiency, also not on SGLT2 inhibitor for similar reason.  Plan is to start Proamatine at 5 mg twice daily, still holding off beta-blocker.  Check follow-up echocardiogram and arrange clinic reassessment.  May need to consider right and left heart catheterization with one of the advanced heart failure team providers to help tailor further treatment.  2.  Multivessel CAD status post CABG with subsequently documented graft disease and DES intervention as outlined above.  Does not report active angina.  Continues on low-dose aspirin, Ranexa, and Crestor.  3.  CKD stage  IIIb, last creatinine 1.92 in July with normal potassium.  4.  Paroxysmal atrial fibrillation with CHA2DS2-VASc score 5.  Continue Eliquis for stroke prophylaxis and low-dose amiodarone for rhythm suppression.  5.  Moderate, asymptomatic ICA stenosis.  Follow-up carotid Dopplers will be obtained.  Medication Adjustments/Labs and Tests Ordered: Current medicines are reviewed at length with the patient today.  Concerns regarding medicines are outlined above.   Tests Ordered: Orders Placed This Encounter  Procedures   US Carotid Bilateral   ECHOCARDIOGRAM COMPLETE  Medication Changes: Meds ordered this encounter  Medications   midodrine (PROAMATINE) 5 MG tablet    Sig: Take 1 tablet (5 mg total) by mouth 2 (two) times daily with a meal.    Dispense:  60 tablet    Refill:  2    01/30/2021 NEW     Disposition:  Follow up  3 to 4 weeks in the [place] office.  Signed, Satira Sark, MD, [facility] 01/30/2021 11:43 AM    [facility] at [place], [place], [place] [address] Phone: [phone]; Fax: [phone]

## 2021-01-30 ENCOUNTER — Ambulatory Visit (INDEPENDENT_AMBULATORY_CARE_PROVIDER_SITE_OTHER): Payer: Medicare Other | Admitting: Cardiology

## 2021-01-30 ENCOUNTER — Other Ambulatory Visit: Payer: Self-pay

## 2021-01-30 ENCOUNTER — Encounter: Payer: Self-pay | Admitting: Cardiology

## 2021-01-30 VITALS — BP 98/59 | HR 83 | Ht 64.0 in | Wt 145.6 lb

## 2021-01-30 DIAGNOSIS — I6523 Occlusion and stenosis of bilateral carotid arteries: Secondary | ICD-10-CM

## 2021-01-30 DIAGNOSIS — I25119 Atherosclerotic heart disease of native coronary artery with unspecified angina pectoris: Secondary | ICD-10-CM

## 2021-01-30 DIAGNOSIS — R0989 Other specified symptoms and signs involving the circulatory and respiratory systems: Secondary | ICD-10-CM

## 2021-01-30 DIAGNOSIS — I255 Ischemic cardiomyopathy: Secondary | ICD-10-CM | POA: Diagnosis not present

## 2021-01-30 DIAGNOSIS — I5042 Chronic combined systolic (congestive) and diastolic (congestive) heart failure: Secondary | ICD-10-CM

## 2021-01-30 MED ORDER — MIDODRINE HCL 5 MG PO TABS: 5.0000 mg | ORAL_TABLET | Freq: Two times a day (BID) | ORAL | 2 refills | Status: DC

## 2021-01-30 NOTE — Patient Instructions (Signed)
Medication Instructions:  Your physician has recommended you make the following change in your medication:  Start midodrine 5 mg by mouth twice daily Continue other medications the same  Labwork: none  Testing/Procedures: Your physician has requested that you have an echocardiogram. Echocardiography is a painless test that uses sound waves to create images of your heart. It provides your doctor with information about the size and shape of your heart and how well your heart's chambers and valves are working. This procedure takes approximately one hour. There are no restrictions for this procedure. Your physician has requested that you have a carotid duplex. This test is an ultrasound of the carotid arteries in your neck. It looks at blood flow through these arteries that supply the brain with blood. Allow one hour for this exam. There are no restrictions or special instructions.  Follow-Up: Your physician recommends that you schedule a follow-up appointment in: 3-4 weeks at the [facility] office  Any Other Special Instructions Will Be Listed Below (If Applicable).  If you need a refill on your cardiac medications before your next appointment, please call your pharmacy.

## 2021-02-15 ENCOUNTER — Other Ambulatory Visit: Payer: Self-pay

## 2021-02-15 ENCOUNTER — Ambulatory Visit (HOSPITAL_COMMUNITY)
Admission: RE | Admit: 2021-02-15 | Discharge: 2021-02-15 | Disposition: A | Discharge status: Home / Self Care | Payer: Medicare Other | Source: Ambulatory Visit | Attending: Cardiology | Admitting: Cardiology

## 2021-02-15 DIAGNOSIS — I255 Ischemic cardiomyopathy: Secondary | ICD-10-CM

## 2021-02-15 LAB — ECHOCARDIOGRAM COMPLETE
AR max vel: 1.39 cm2
AV Area VTI: 1.36 cm2
AV Area mean vel: 1.33 cm2
AV Mean grad: 6.9 mmHg
AV Peak grad: 12.1 mmHg
Ao pk vel: 1.74 m/s
Area-P 1/2: 3.65 cm2
MV M vel: 5.02 m/s
MV Peak grad: 100.8 mmHg
P 1/2 time: 503 msec
Radius: 0.4 cm
S' Lateral: 4.8 cm
Single Plane A4C EF: 35.2 %

## 2021-02-15 NOTE — Progress Notes (Signed)
*  PRELIMINARY RESULTS* Echocardiogram 2D Echocardiogram has been performed.  Hunter Townsend 02/15/2021, 2:48 PM

## 2021-02-20 ENCOUNTER — Telehealth: Payer: Self-pay | Admitting: *Deleted

## 2021-02-20 NOTE — Telephone Encounter (Signed)
Laurine Blazer, LPN  3/90/3009  2:33 AM EDT Back to Top    Notified, copy to pcp.    Laurine Blazer, LPN  0/11/6224  3:33 PM EDT     Left message to return call.   Satira Sark, MD  02/15/2021  8:32 PM EDT     Results reviewed.  No significant change in LVEF, 30 to 35% range.  RV contraction normal with normal estimated pulmonary pressure as well.  Keep scheduled follow-up for further discussion of symptoms as per recent office note.

## 2021-02-27 ENCOUNTER — Telehealth: Payer: Self-pay | Admitting: Cardiology

## 2021-02-27 NOTE — Telephone Encounter (Signed)
Pt came into office complaining of having shortness of breath with doing simple tasks like getting dressed or walking around his house.   Pt stated that he spoke w/ Dr. Pleas Koch and he told pt that his heart is not pumping properly.    1. Are you currently SOB (can you hear that pt is SOB on the phone)?   Yes- pt does not currently sound SOB while in front of me   2. How long have you been experiencing SOB?   For a while- it has gotten worse, not better  3. Are you SOB when sitting or when up moving around?   Moving around   4. Are you currently experiencing any other symptoms?    Having bilateral leg swelling   Can be reached @ [phone]

## 2021-02-27 NOTE — Telephone Encounter (Signed)
Reports SOB is about the same as before and not any worse. Weighed 150 lbs today and says he normally weighs around 148 lbs. Reports swelling in ankles is new. Medications reviewed. Advised to keep up coming visit on 03/08/21. Advised if symptoms get worse before visit, he needed to go to the ED for an evaluation.

## 2021-03-08 ENCOUNTER — Ambulatory Visit (INDEPENDENT_AMBULATORY_CARE_PROVIDER_SITE_OTHER): Payer: Medicare Other | Admitting: Student

## 2021-03-08 ENCOUNTER — Other Ambulatory Visit: Payer: Self-pay

## 2021-03-08 ENCOUNTER — Encounter: Payer: Self-pay | Admitting: *Deleted

## 2021-03-08 ENCOUNTER — Encounter: Payer: Self-pay | Admitting: Student

## 2021-03-08 VITALS — BP 122/76 | HR 80 | Ht 64.0 in | Wt 145.0 lb

## 2021-03-08 DIAGNOSIS — I951 Orthostatic hypotension: Secondary | ICD-10-CM

## 2021-03-08 DIAGNOSIS — I5042 Chronic combined systolic (congestive) and diastolic (congestive) heart failure: Secondary | ICD-10-CM

## 2021-03-08 DIAGNOSIS — N1832 Chronic kidney disease, stage 3b: Secondary | ICD-10-CM

## 2021-03-08 DIAGNOSIS — I25119 Atherosclerotic heart disease of native coronary artery with unspecified angina pectoris: Secondary | ICD-10-CM

## 2021-03-08 DIAGNOSIS — I480 Paroxysmal atrial fibrillation: Secondary | ICD-10-CM | POA: Diagnosis not present

## 2021-03-08 NOTE — Patient Instructions (Signed)
  Medication Instructions:  Limit daily fluid intake to less than 2 Liters per day. Please limit salt intake.  Please weight yourself every morning. Take an extra Lasix tablet weight increases by 2 pounds overnight or 5 pounds in a single week.  *If you need a refill on your cardiac medications before your next appointment, please call your pharmacy*   Lab Work:  NONE  If you have labs (blood work) drawn today and your tests are completely normal, you will receive your results only by: [place] (if you have MyChart) OR A paper copy in the mail If you have any lab test that is abnormal or we need to change your treatment, we will call you to review the results.   Testing/Procedures: NONE    Follow-Up: At [facility], you and your health needs are our priority.  As part of our continuing mission to provide you with exceptional heart care, we have created designated Provider Care Teams.  These Care Teams include your primary Cardiologist (physician) and Advanced Practice Providers (APPs -  Physician Assistants and Nurse Practitioners) who all work together to provide you with the care you need, when you need it.  We recommend signing up for the patient portal called "MyChart".  Sign up information is provided on this After Visit Summary.  MyChart is used to connect with patients for Virtual Visits (Telemedicine).  Patients are able to view lab/test results, encounter notes, upcoming appointments, etc.  Non-urgent messages can be sent to your provider as well.   To learn more about what you can do with MyChart, go to [email or web address].    Your next appointment:   2 month(s)  The format for your next appointment:   In Person  Provider:   Rozann Lesches, MD   Other Instructions Thank you for choosing [facility]!

## 2021-03-08 NOTE — Progress Notes (Signed)
Cardiology Office Note    Date:  03/08/2021   ID:  Hunter Townsend, DOB 1940/08/17, MRN [id number]  PCP:  Curlene Labrum, MD  Cardiologist: Rozann Lesches, MD    Chief Complaint  Patient presents with   Follow-up    4 week visit    History of Present Illness:    Hunter Townsend is a 80 y.o. male with past medical history of CAD (s/p CABG in 1987, occlusion of all vein grafts and DES placed to LIMA-LAD in 2013, cath in 2017 showing moderate ISR of DES to LIMA-LAD and patent by repeat cath in 04/2017), HFrEF (EF 35-40% by echo in 2013, 30-35% in 01/2020 and 30-35% in 01/2021), PAD, paroxysmal atrial fibrillation, Stage 3 CKD, HTN and HLD who presents to the office today for 4-week follow-up.  He was examined by [name] in 01/2021 and reported more shortness of breath with activities and also orthostatic dizziness. His SBP did drop into the 90's when checking orthostatics and he was started on Midodrine 5 mg twice daily. A follow-up echocardiogram was recommended with close follow-up to consider [facility] pending reassessment of symptoms. Echocardiogram showed his EF was around 30 to 35% with normal RV function.   In the interim, he was admitted to [facility] from 10/5 - 03/01/2021 for an acute CHF exacerbation and proBNP was elevated to 7300 on admission. He received several doses of IV Lasix and was discharged on Lasix 40 mg daily for 5 days then to go back to taking 20mg  every other day as previously prescribed.   In talking with the patient today, he reports his breathing has improved since his hospitalization. He walks laps at [facility] in the morning and denies any recent chest pain or dyspnea on exertion with this. No recent orthopnea or PND. Does experience intermittent lower extremity edema but says his weight has been stable on his home scales. Continues to experience intermittent weakness and is unsure if this improved following initiation of Midodrine. Says his BP has  been well-controlled when checked at home.    Past Medical History:  Diagnosis Date   Arthritis    Atrial fibrillation (HCC)    BPH (benign prostatic hyperplasia)    CHF (congestive heart failure) ([place])    Coronary atherosclerosis of native coronary artery    a. s/p CABG in 1987 b. occlusion of all vein grafts and DES placed to LIMA-LAD in 2013, c. cath in 2017 showing moderate ISR of DES to LIMA-LAD and patent by repeat cath in 04/2017   Hypertension    Ischemic cardiomyopathy    EF 35-40% by cath 06/24/11 (EF 37% by nuclear stress test 06/2010).   Myocardial infarction [facility]) 2013   PAD (peripheral artery disease) ([place])    Status post left SFA stent - Dr. Burt Knack    Past Surgical History:  Procedure Laterality Date   BACK SURGERY     "cut me ~ 1/2inch to take sciatic nerve out of my right leg"   CARDIAC CATHETERIZATION  03/28/2016   CARDIAC CATHETERIZATION N/A 03/28/2016   Procedure: Left Heart Cath and Cors/Grafts Angiography;  Surgeon: Leonie Man, MD;  Location: [place] CV LAB;  Service: Cardiovascular;  Laterality: N/A;   CARDIOVERSION N/A 09/21/2019   Procedure: CARDIOVERSION;  Surgeon: Skeet Latch, MD;  Location: [place];  Service: Cardiovascular;  Laterality: N/A;   CATARACT EXTRACTION W/ INTRAOCULAR LENS  IMPLANT, BILATERAL     CORONARY ANGIOPLASTY     CORONARY ARTERY BYPASS GRAFT  [place], CT - LIMA to LAD, SVG to RCA presumably   LEFT HEART CATH AND CORS/GRAFTS ANGIOGRAPHY N/A 05/12/2017   Procedure: LEFT HEART CATH AND CORS/GRAFTS ANGIOGRAPHY;  Surgeon: Burnell Blanks, MD;  Location: [place] CV LAB;  Service: Cardiovascular;  Laterality: N/A;   LEFT HEART CATHETERIZATION WITH CORONARY/GRAFT ANGIOGRAM  06/24/2011   Procedure: LEFT HEART CATHETERIZATION WITH Beatrix Fetters;  Surgeon: Burnell Blanks, MD;  Location: [facility] C[facility] LAB;  Service: Cardiovascular;;   LEFT HEART CATHETERIZATION WITH CORONARY/GRAFT ANGIOGRAM N/A  02/26/2013   Procedure: LEFT HEART CATHETERIZATION WITH Beatrix Fetters;  Surgeon: Blane Ohara, MD;  Location: [facility] CATH LAB;  Service: Cardiovascular;  Laterality: N/A;   LOWER EXTREMITY ANGIOGRAM N/A 10/30/2011   Procedure: LOWER EXTREMITY ANGIOGRAM;  Surgeon: Sherren Mocha, MD;  Location: [facility] CATH LAB;  Service: Cardiovascular;  Laterality: N/A;   PERCUTANEOUS CORONARY STENT INTERVENTION (PCI-S)  02/26/2013   Procedure: PERCUTANEOUS CORONARY STENT INTERVENTION (PCI-S);  Surgeon: Blane Ohara, MD;  Location: [facility] CATH LAB;  Service: Cardiovascular;;    Current Medications: Outpatient Medications Prior to Visit  Medication Sig Dispense Refill   acetaminophen (TYLENOL) 325 MG tablet Take 650 mg by mouth every 12 (twelve) hours as needed for moderate pain or headache.      amiodarone (PACERONE) 200 MG tablet Take 0.5 tablets (100 mg total) by mouth daily. 90 tablet 3   apixaban (ELIQUIS) 2.5 MG TABS tablet TAKE (1) TABLET TWICE DAILY. 60 tablet 5   aspirin EC 81 MG tablet Take 1 tablet (81 mg total) by mouth daily. 90 tablet 3   ENTRESTO 24-26 MG TAKE (1) TABLET TWICE DAILY. 60 tablet 6   furosemide (LASIX) 20 MG tablet Take 1 tablet (20 mg total) by mouth every other day. 45 tablet 3   gabapentin (NEURONTIN) 100 MG capsule Take 100 mg by mouth 3 (three) times daily.      latanoprost (XALATAN) 0.005 % ophthalmic solution Place 0.0005 drops into the left eye at bedtime as needed.     LORazepam (ATIVAN) 1 MG tablet Take 1 mg by mouth 2 (two) times daily.     midodrine (PROAMATINE) 5 MG tablet Take 1 tablet (5 mg total) by mouth 2 (two) times daily with a meal. 60 tablet 2   Multiple Vitamin (MULTIVITAMIN) tablet Take 1 tablet by mouth daily.     nitroGLYCERIN (NITROSTAT) 0.4 MG SL tablet Place 1 tablet (0.4 mg total) under the tongue every 5 (five) minutes x 3 doses as needed for chest pain (if no relief after 3rd dose, proceed to the ED for an evaluation or call 911). For chest pain 25  tablet 3   Omega-3 Fatty Acids (FISH OIL) 1000 MG CAPS Take 2,000 mg by mouth daily.     OVER THE COUNTER MEDICATION Place 1 application rectally as needed (constipation). Suppository to help with BM with Occasional fleet enema     pantoprazole (PROTONIX) 40 MG tablet Take 40 mg by mouth every evening.     ranolazine (RANEXA) 500 MG 12 hr tablet TAKE 1 TABLET EVERY 12 HOURS. 180 tablet 0   rosuvastatin (CRESTOR) 5 MG tablet Take 5 mg by mouth daily.     triamcinolone cream (KENALOG) 0.1 % Apply 1 application topically daily as needed (rash).      No facility-administered medications prior to visit.     Allergies:   Patient has no known allergies.   Social History   Socioeconomic History   Marital status: Widowed  Spouse name: Not on file   Number of children: Not on file   Years of education: Not on file   Highest education level: Not on file  Occupational History   Occupation: Retired    Comment: Orthoptist  Tobacco Use   Smoking status: Former    Packs/day: 2.00    Years: 40.00    Pack years: 80.00    Types: Cigarettes    Quit date: 05/27/1993    Years since quitting: 27.8   Smokeless tobacco: Never  Vaping Use   Vaping Use: Never used  Substance and Sexual Activity   Alcohol use: No    Alcohol/week: 0.0 standard drinks   Drug use: No   Sexual activity: Not Currently  Other Topics Concern   Not on file  Social History Narrative   Not on file   Social Determinants of Health   Financial Resource Strain: Not on file  Food Insecurity: Not on file  Transportation Needs: Not on file  Physical Activity: Not on file  Stress: Not on file  Social Connections: Not on file     Family History:  The patient's family history includes CAD in an other family member; Hypertension in an other family member.   Review of Systems:    Please see the history of present illness.     All other systems reviewed and are otherwise negative except as noted above.   Physical Exam:     VS:  BP 122/76   Pulse 80   Ht 5\' 4"  (1.626 m)   Wt 145 lb (65.8 kg)   SpO2 98%   BMI 24.89 kg/m    General: Pleasant elderly male appearing in no acute distress. Head: Normocephalic, atraumatic. Neck: No carotid bruits. JVD not elevated.  Lungs: Respirations regular and unlabored, without wheezes or rales.  Heart: Regular rate and rhythm. No S3 or S4.  No murmur, no rubs, or gallops appreciated. Abdomen: Appears non-distended. No obvious abdominal masses. Msk:  Strength and tone appear normal for age. No obvious joint deformities or effusions. Extremities: No clubbing or cyanosis. Trace ankle edema bilaterally.  Distal pedal pulses are 2+ bilaterally. Neuro: Alert and oriented X 3. Moves all extremities spontaneously. No focal deficits noted. Psych:  Responds to questions appropriately with a normal affect. Skin: No rashes or lesions noted  Wt Readings from Last 3 Encounters:  03/08/21 145 lb (65.8 kg)  01/30/21 145 lb 9.6 oz (66 kg)  07/20/20 150 lb 3.2 oz (68.1 kg)     Studies/Labs Reviewed:   EKG:  EKG is not ordered today.    Recent Labs: 05/16/2020: BUN 32; Creatinine, Ser 1.84; Potassium 3.9; Sodium 141   Lipid Panel    Component Value Date/Time   CHOL 152 06/25/2011 0400   TRIG 150 (H) 06/25/2011 0400   HDL 28 (L) 06/25/2011 0400   CHOLHDL 5.4 06/25/2011 0400   VLDL 30 06/25/2011 0400   LDLCALC 94 06/25/2011 0400    Additional studies/ records that were reviewed today include:   Cardiac Catheterization: 04/2017 Prox RCA lesion is 100% stenosed. Dist LM to Ost LAD lesion is 100% stenosed. LIMA graft was visualized by angiography and is normal in caliber. Dist Graft to Insertion lesion is 20% stenosed. Mid LAD lesion is 30% stenosed. Prox Cx to Mid Cx lesion is 50% stenosed. Ost 2nd Mrg to 2nd Mrg lesion is 50% stenosed. Ost 3rd Mrg lesion is 100% stenosed. Origin lesion is 100% stenosed. Origin lesion is 100% stenosed. SVG graft  was not  visualized. SVG graft was not visualized.   1. 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 Presumed that the vein grafts supplied the RCA and OM branches and have been chronically occluded.  2. The ostial LAD is chronically occluded. The LIMA graft to the LAD is patent. The stented segment of the graft at the anastomosis to the LAD is patent with minimal restenosis. The mid LAD stent just beyond the anastomosis is patent with minimal restenosis.  3. The Circumflex artery is patent with moderate diffuse disease in the proximal and mid vessel. There are several small caliber OM branches with diffuse severe disease. The third Om branch has moderate non-obstructive disease. The 4th OM branch is now occluded. This is a new finding. This branch fills from left to left collaterals.  4. The RCA is chronically occluded in the proximal segment. The vein graft to the PDA is occluded. The mid and distal RCA fills briskly from left to right collaterals.  5. Mild elevation of LV filling pressure    Echocardiogram: 01/2021 IMPRESSIONS     1. Left ventricular ejection fraction, by estimation, is 30 to 35%. The  left ventricle has moderately decreased function. The left ventricle  demonstrates regional wall motion abnormalities (see scoring  diagram/findings for description). Left ventricular   diastolic parameters are indeterminate.   2. Right ventricular systolic function is normal. The right ventricular  size is normal. There is normal pulmonary artery systolic pressure.   3. Left atrial size was severely dilated.   4. Right atrial size was mildly dilated.   5. The mitral valve is normal in structure. Mild mitral valve  regurgitation. No evidence of mitral stenosis.   6. The tricuspid valve is abnormal. Tricuspid valve regurgitation is mild  to moderate.   7. The aortic valve is tricuspid. There is mild calcification of the  aortic valve. There is mild  thickening of the aortic valve. Aortic valve  regurgitation is mild. No aortic stenosis is present.   8. The inferior vena cava is normal in size with greater than 50%  respiratory variability, suggesting right atrial pressure of 3 mmHg.   9. Probable small PFO with left to right shunt.   Assessment:    1. Coronary artery disease involving native coronary artery of native heart with angina pectoris ([place])   2. Chronic combined systolic and diastolic heart failure (HCC)   3. Paroxysmal atrial fibrillation ([place])   4. Orthostatic hypotension   5. Stage 3b chronic kidney disease ([place])      Plan:   In order of problems listed above:  1. CAD - He is s/p CABG in 1987 with known occlusion of all vein grafts and DES placed to LIMA-LAD in 2013 which was patent by caths in 2017 and 2018.  - He reports generalized weakness but denies any specific chest pain or dyspnea on exertion. We discussed a possible R/LHC as previously mentioned by [name] but he wishes to hold off on further evaluation at this time given the overall stability of his symptoms. I encouraged him to reach out if he develops any new symptoms in the interim.  - Continue ASA 81mg  daily and Crestor 5mg  daily.   2. HFrEF - His EF was at 35-40% by echo in 2013, 30-35% in 01/2020 and 30-35% in 01/2021. - He was hospitalized for a CHF exacerbation earlier this month but his breathing now feels back to baseline and  his weight has been stable. Will continue with Lasix 20mg  every other day and we reviewed he can take an extra tablet if needed for weight gain or edema. Did not titrate to daily dosing given his variable renal function and associated orthostasis. Continue Entresto 24-26mg  BID. He has not been on a BB due to prior bradycardia and not on Spironolactone due to his variable renal function. Was also previously not on an SGLT2 inhibitor given his renal function as well but his GFR had improved to 42 when checked earlier this month.  He did have follow-up labs with his PCP and I will request a copy. If renal function overall stable, would consider a trial of Jardiance.   3. Paroxysmal Atrial Fibrillation - He is in NSR by examination today. Remains on Amiodarone 100mg  daily for rhythm suppression. LFT's normal on 02/28/2021 and TSH WNL earlier this year. Will request most recent labs from his PCP.  - He remains on Eliquis 2.5mg  BID for anticoagulation which is the appropriate dose at this time given his creatinine of 1.61 (on 03/01/2021) and age of 21.  4. Orthostatic Hypotension - He is unsure if his dizziness improved with initiation of Midodrine but BP has overall been well-controlled and SBP has been in the 110's to 120's when checked at home. Continue Midodrine 5mg  BID.   5. Stage 3 CKD - His creatinine was stable at 1.61 by recent labs on 03/01/2021.    Medication Adjustments/Labs and Tests Ordered: Current medicines are reviewed at length with the patient today.  Concerns regarding medicines are outlined above.  Medication changes, Labs and Tests ordered today are listed in the Patient Instructions below. Patient Instructions   Medication Instructions:  Limit daily fluid intake to less than 2 Liters per day. Please limit salt intake.  Please weight yourself every morning. Take an extra Lasix tablet weight increases by 2 pounds overnight or 5 pounds in a single week.  *If you need a refill on your cardiac medications before your next appointment, please call your pharmacy*   Lab Work:  NONE  If you have labs (blood work) drawn today and your tests are completely normal, you will receive your results only by: [place] (if you have MyChart) OR A paper copy in the mail If you have any lab test that is abnormal or we need to change your treatment, we will call you to review the results.   Testing/Procedures: NONE    Follow-Up: At [facility], you and your health needs are our priority.  As part of  our continuing mission to provide you with exceptional heart care, we have created designated Provider Care Teams.  These Care Teams include your primary Cardiologist (physician) and Advanced Practice Providers (APPs -  Physician Assistants and Nurse Practitioners) who all work together to provide you with the care you need, when you need it.  We recommend signing up for the patient portal called "MyChart".  Sign up information is provided on this After Visit Summary.  MyChart is used to connect with patients for Virtual Visits (Telemedicine).  Patients are able to view lab/test results, encounter notes, upcoming appointments, etc.  Non-urgent messages can be sent to your provider as well.   To learn more about what you can do with MyChart, go to [email or web address].    Your next appointment:   2 month(s)  The format for your next appointment:   In Person  Provider:   Rozann Lesches, MD   Other Instructions Thank you  for choosing [facility]!     Signed, Erma Heritage, PA-C  03/08/2021 8:23 PM    [facility] [address] [place], [place] [address] Phone: [phone] Fax: [phone]

## 2021-03-15 ENCOUNTER — Ambulatory Visit (INDEPENDENT_AMBULATORY_CARE_PROVIDER_SITE_OTHER): Payer: Medicare Other

## 2021-03-15 DIAGNOSIS — R0989 Other specified symptoms and signs involving the circulatory and respiratory systems: Secondary | ICD-10-CM | POA: Diagnosis not present

## 2021-03-16 ENCOUNTER — Telehealth: Payer: Self-pay | Admitting: *Deleted

## 2021-03-16 NOTE — Telephone Encounter (Signed)
Patient informed. Copy sent to PCP °

## 2021-03-16 NOTE — Telephone Encounter (Signed)
-----   Message from Satira Sark, MD sent at 03/16/2021  8:11 AM EDT ----- Results reviewed.  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  Continue with medical therapy and arrange follow-up study in 1 year.

## 2021-04-07 ENCOUNTER — Other Ambulatory Visit: Payer: Self-pay | Admitting: Cardiology

## 2021-04-16 ENCOUNTER — Other Ambulatory Visit (HOSPITAL_COMMUNITY): Payer: Self-pay | Admitting: Cardiology

## 2021-04-16 DIAGNOSIS — I6523 Occlusion and stenosis of bilateral carotid arteries: Secondary | ICD-10-CM

## 2021-04-17 ENCOUNTER — Other Ambulatory Visit: Payer: Self-pay | Admitting: Cardiology

## 2021-05-14 ENCOUNTER — Encounter: Payer: Self-pay | Admitting: *Deleted

## 2021-05-16 ENCOUNTER — Ambulatory Visit (INDEPENDENT_AMBULATORY_CARE_PROVIDER_SITE_OTHER): Payer: Medicare Other | Admitting: Cardiology

## 2021-05-16 ENCOUNTER — Encounter: Payer: Self-pay | Admitting: Cardiology

## 2021-05-16 VITALS — BP 114/62 | HR 79 | Ht 65.0 in | Wt 153.6 lb

## 2021-05-16 DIAGNOSIS — I255 Ischemic cardiomyopathy: Secondary | ICD-10-CM | POA: Diagnosis not present

## 2021-05-16 DIAGNOSIS — I25119 Atherosclerotic heart disease of native coronary artery with unspecified angina pectoris: Secondary | ICD-10-CM | POA: Diagnosis not present

## 2021-05-16 DIAGNOSIS — I5020 Unspecified systolic (congestive) heart failure: Secondary | ICD-10-CM

## 2021-05-16 DIAGNOSIS — I480 Paroxysmal atrial fibrillation: Secondary | ICD-10-CM | POA: Diagnosis not present

## 2021-05-16 NOTE — Progress Notes (Signed)
Cardiology Office Note  Date: 05/16/2021   ID: Hunter Townsend, DOB May 13, 1941, MRN [id number]  PCP:  Curlene Labrum, MD  Cardiologist:  Rozann Lesches, MD Electrophysiologist:  None   Chief Complaint  Patient presents with   Cardiac follow-up    History of Present Illness: Hunter Townsend is an 80 y.o. male last seen in October by Ms. Strader PA-C.  He is here for a follow-up visit.  He does not describe any angina or nitroglycerin use in the interim, no sense of palpitations.  He has stable NYHA class II dyspnea.  He has been troubled by lower back pain, has disc disease and has undergone prior pain control injections.  He plans to follow-up on this after the holidays.  I would anticipate that he could hold Eliquis temporarily if necessary.  I reviewed his cardiac regimen which is otherwise stable and outlined below.  Heart rate and blood pressure are well controlled today.  He does not report any orthostatic dizziness.  Past Medical History:  Diagnosis Date   Arthritis    Atrial fibrillation (HCC)    BPH (benign prostatic hyperplasia)    CHF (congestive heart failure) ([place])    Coronary atherosclerosis of native coronary artery    a. s/p CABG in 1987 b. occlusion of all vein grafts and DES placed to LIMA-LAD in 2013, c. cath in 2017 showing moderate ISR of DES to LIMA-LAD and patent by repeat cath in 04/2017   Hypertension    Ischemic cardiomyopathy    EF 35-40% by cath 06/24/11 (EF 37% by nuclear stress test 06/2010).   Myocardial infarction [facility]) 2013   PAD (peripheral artery disease) ([place])    Status post left SFA stent - Dr. Burt Knack    Past Surgical History:  Procedure Laterality Date   BACK SURGERY     "cut me ~ 1/2inch to take sciatic nerve out of my right leg"   CARDIAC CATHETERIZATION  03/28/2016   CARDIAC CATHETERIZATION N/A 03/28/2016   Procedure: Left Heart Cath and Cors/Grafts Angiography;  Surgeon: Leonie Man, MD;  Location: [place] CV [facility];   Service: Cardiovascular;  Laterality: N/A;   CARDIOVERSION N/A 09/21/2019   Procedure: CARDIOVERSION;  Surgeon: Skeet Latch, MD;  Location: [place];  Service: Cardiovascular;  Laterality: N/A;   CATARACT EXTRACTION W/ INTRAOCULAR LENS  IMPLANT, BILATERAL     CORONARY ANGIOPLASTY     CORONARY ARTERY [place], [place] - LIMA to LAD, SVG to RCA presumably   LEFT HEART CATH AND CORS/GRAFTS ANGIOGRAPHY N/A 05/12/2017   Procedure: LEFT HEART CATH AND CORS/GRAFTS ANGIOGRAPHY;  Surgeon: Burnell Blanks, MD;  Location: [place] [facility];  Service: Cardiovascular;  Laterality: N/A;   LEFT HEART CATHETERIZATION WITH CORONARY/GRAFT ANGIOGRAM  06/24/2011   Procedure: LEFT HEART CATHETERIZATION WITH Beatrix Fetters;  Surgeon: Burnell Blanks, MD;  Location: [facility] CATH LAB;  Service: Cardiovascular;;   LEFT HEART CATHETERIZATION WITH CORONARY/GRAFT ANGIOGRAM N/A 02/26/2013   Procedure: LEFT HEART CATHETERIZATION WITH Beatrix Fetters;  Surgeon: Blane Ohara, MD;  Location: [facility] CATH LAB;  Service: Cardiovascular;  Laterality: N/A;   LOWER EXTREMITY ANGIOGRAM N/A 10/30/2011   Procedure: LOWER EXTREMITY ANGIOGRAM;  Surgeon: Sherren Mocha, MD;  Location: [facility] CATH LAB;  Service: Cardiovascular;  Laterality: N/A;   PERCUTANEOUS CORONARY STENT INTERVENTION (PCI-S)  02/26/2013   Procedure: PERCUTANEOUS CORONARY STENT INTERVENTION (PCI-S);  Surgeon: Blane Ohara, MD;  Location: [facility] CATH LAB;  Service: Cardiovascular;;  Current Outpatient Medications  Medication Sig Dispense Refill   acetaminophen (TYLENOL) 325 MG tablet Take 650 mg by mouth every 12 (twelve) hours as needed for moderate pain or headache.      amiodarone (PACERONE) 200 MG tablet Take 0.5 tablets (100 mg total) by mouth daily. 90 tablet 3   apixaban (ELIQUIS) 2.5 MG TABS tablet TAKE (1) TABLET TWICE DAILY. 60 tablet 5   aspirin EC 81 MG tablet Take 1 tablet (81 mg total) by mouth daily. 90  tablet 3   furosemide (LASIX) 20 MG tablet Take 1 tablet (20 mg total) by mouth every other day. 45 tablet 3   gabapentin (NEURONTIN) 100 MG capsule Take 100 mg by mouth 3 (three) times daily.      latanoprost (XALATAN) 0.005 % ophthalmic solution Place 0.0005 drops into the left eye at bedtime as needed.     LORazepam (ATIVAN) 1 MG tablet Take 1 mg by mouth 2 (two) times daily.     midodrine (PROAMATINE) 5 MG tablet Take 1 tablet (5 mg total) by mouth 2 (two) times daily with a meal. 60 tablet 2   Multiple Vitamin (MULTIVITAMIN) tablet Take 1 tablet by mouth daily.     nitroGLYCERIN (NITROSTAT) 0.4 MG SL tablet Place 1 tablet (0.4 mg total) under the tongue every 5 (five) minutes x 3 doses as needed for chest pain (if no relief after 3rd dose, proceed to the ED for an evaluation or call 911). For chest pain 25 tablet 3   Omega-3 Fatty Acids (FISH OIL) 1000 MG CAPS Take 2,000 mg by mouth daily.     OVER THE COUNTER MEDICATION Place 1 application rectally as needed (constipation). Suppository to help with BM with Occasional fleet enema     pantoprazole (PROTONIX) 40 MG tablet Take 40 mg by mouth every evening.     ranolazine (RANEXA) 500 MG 12 hr tablet TAKE 1 TABLET EVERY 12 HOURS. 60 tablet 3   rosuvastatin (CRESTOR) 5 MG tablet Take 5 mg by mouth daily.     sacubitril-valsartan (ENTRESTO) 24-26 MG TAKE (1) TABLET TWICE DAILY. 60 tablet 11   triamcinolone cream (KENALOG) 0.1 % Apply 1 application topically daily as needed (rash).      No current facility-administered medications for this visit.   Allergies:  Patient has no known allergies.   ROS: No syncope.  Physical Exam: VS:  BP 114/62    Pulse 79    Ht 5\' 5"  (1.651 m)    Wt 153 lb 9.6 oz (69.7 kg)    SpO2 99%    BMI 25.56 kg/m , BMI Body mass index is 25.56 kg/m.  Wt Readings from Last 3 Encounters:  05/16/21 153 lb 9.6 oz (69.7 kg)  03/08/21 145 lb (65.8 kg)  01/30/21 145 lb 9.6 oz (66 kg)    General: Patient appears comfortable  at rest. HEENT: Conjunctiva and lids normal, wearing a mask. Neck: Supple, no elevated JVP or carotid bruits, no thyromegaly. Lungs: Clear to auscultation, nonlabored breathing at rest. Cardiac: Regular rate and rhythm, no S3, 2/6 holosystolic murmur. Extremities: No pitting edema.  ECG:  An ECG dated 05/07/2021 was personally reviewed today and demonstrated:  Sinus rhythm with IVCD and PVCs.  Recent Labwork: No results found for requested labs within last 8760 hours.     Component Value Date/Time   CHOL 152 06/25/2011 0400   TRIG 150 (H) 06/25/2011 0400   HDL 28 (L) 06/25/2011 0400   CHOLHDL 5.4 06/25/2011 0400  VLDL 30 06/25/2011 0400   LDLCALC 94 06/25/2011 0400  December 2022: High-sensitivity troponin I 18, hemoglobin 9.8, platelets 220, potassium 4.1, BUN 21, creatinine 1.59, AST 22, ALT 12, TSH 6.15  Other Studies Reviewed Today:  Echocardiogram 02/15/2021:  1. Left ventricular ejection fraction, by estimation, is 30 to 35%. The  left ventricle has moderately decreased function. The left ventricle  demonstrates regional wall motion abnormalities (see scoring  diagram/findings for description). Left ventricular   diastolic parameters are indeterminate.   2. Right ventricular systolic function is normal. The right ventricular  size is normal. There is normal pulmonary artery systolic pressure.   3. Left atrial size was severely dilated.   4. Right atrial size was mildly dilated.   5. The mitral valve is normal in structure. Mild mitral valve  regurgitation. No evidence of mitral stenosis.   6. The tricuspid valve is abnormal. Tricuspid valve regurgitation is mild  to moderate.   7. The aortic valve is tricuspid. There is mild calcification of the  aortic valve. There is mild thickening of the aortic valve. Aortic valve  regurgitation is mild. No aortic stenosis is present.   8. The inferior vena cava is normal in size with greater than 50%  respiratory variability,  suggesting right atrial pressure of 3 mmHg.   9. Probable small PFO with left to right shunt.   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 Stable.   Left Carotid: Velocities in the left ICA are consistent with a 40-59%  stenosis.                Stable.   Vertebrals:  Bilateral vertebral arteries demonstrate antegrade flow.  Subclavians: Normal flow hemodynamics were seen in bilateral subclavian               arteries.   Assessment and Plan:  1.  HFrEF with chronic combined heart failure, LVEF 30 to 35% in the setting of ischemic heart disease.  He is feeling better over the last few months overall, currently NYHA class II dyspnea and no active angina.  Blood pressure more stable since being on ProAmatine.  Plan to continue Ranexa, Entresto, Crestor, and aspirin.  2.  Paroxysmal atrial fibrillation with CHA2DS2-VASc score of 5.  Heart rate is regular today and reports no interval palpitations.  Continue Eliquis and amiodarone.  I reviewed his recent lab work from December.  3.  Multivessel CAD status post CABG with graft disease and previous DES intervention as discussed above.  No change to current regimen.  4.  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  Medication Adjustments/Labs and Tests Ordered: Current medicines are reviewed at length with the patient today.  Concerns regarding medicines are outlined above.   Tests Ordered: No orders of the defined types were placed in this encounter.   Medication Changes: No orders of the defined types were placed in this encounter.   Disposition:  Follow up  3 months.  Signed, Satira Sark, MD, [facility] 05/16/2021 3:41 PM    [facility] [place], [place], [place] [address] Phone: [phone]; Fax: [phone]

## 2021-05-16 NOTE — Patient Instructions (Addendum)
Medication Instructions:   Your physician recommends that you continue on your current medications as directed. Please refer to the Current Medication list given to you today.  Labwork:  none  Testing/Procedures:  none  Follow-Up:  Your physician recommends that you schedule a follow-up appointment in: 3 months.  Any Other Special Instructions Will Be Listed Below (If Applicable).  If you need a refill on your cardiac medications before your next appointment, please call your pharmacy. 

## 2021-05-17 ENCOUNTER — Other Ambulatory Visit: Payer: Self-pay | Admitting: Cardiology

## 2021-05-29 ENCOUNTER — Emergency Department (HOSPITAL_COMMUNITY)
Admission: EM | Admit: 2021-05-29 | Discharge: 2021-05-29 | Disposition: A | Discharge status: Home / Self Care | Payer: Medicare Other | Attending: Emergency Medicine | Admitting: Emergency Medicine

## 2021-05-29 ENCOUNTER — Encounter (HOSPITAL_COMMUNITY): Payer: Self-pay

## 2021-05-29 ENCOUNTER — Other Ambulatory Visit: Payer: Self-pay

## 2021-05-29 DIAGNOSIS — R209 Unspecified disturbances of skin sensation: Secondary | ICD-10-CM

## 2021-05-29 DIAGNOSIS — I4891 Unspecified atrial fibrillation: Secondary | ICD-10-CM | POA: Insufficient documentation

## 2021-05-29 DIAGNOSIS — I2510 Atherosclerotic heart disease of native coronary artery without angina pectoris: Secondary | ICD-10-CM | POA: Diagnosis not present

## 2021-05-29 DIAGNOSIS — I110 Hypertensive heart disease with heart failure: Secondary | ICD-10-CM | POA: Diagnosis not present

## 2021-05-29 DIAGNOSIS — Z79899 Other long term (current) drug therapy: Secondary | ICD-10-CM | POA: Diagnosis not present

## 2021-05-29 DIAGNOSIS — I509 Heart failure, unspecified: Secondary | ICD-10-CM | POA: Insufficient documentation

## 2021-05-29 DIAGNOSIS — Z87891 Personal history of nicotine dependence: Secondary | ICD-10-CM | POA: Diagnosis not present

## 2021-05-29 DIAGNOSIS — Z951 Presence of aortocoronary bypass graft: Secondary | ICD-10-CM | POA: Insufficient documentation

## 2021-05-29 DIAGNOSIS — R52 Pain, unspecified: Secondary | ICD-10-CM | POA: Diagnosis not present

## 2021-05-29 NOTE — Discharge Instructions (Signed)
You were evaluated in the Emergency Department and after careful evaluation, we did not find any emergent condition requiring admission or further testing in the hospital.  Your exam/testing today is overall reassuring.  Nothing to suggest infection or blood clot or any other emergencies at this follow-up with your primary care doctor.  You could try increasing your dose of gabapentin to 200 mg 3 times daily to see if that helps your symptoms.  We also recommend follow-up with a rheumatologist to see if they have any other management ideas.  Please return to the Emergency Department if you experience any worsening of your condition.   Thank you for allowing [place] to be a part of your care.

## 2021-05-29 NOTE — ED Triage Notes (Signed)
Rcems from home with cc of "pins and needles" in both legs as well as cold feet. Says it has been going on for 3-4 years. Hs been to [employer] [facility] but they dont help him. Also has been to PCP but they say nothing is wrong with him.

## 2021-05-29 NOTE — ED Provider Notes (Signed)
[facility] Emergency Department Provider Note MRN:  [id number]  Arrival date & time: 05/29/21     Chief Complaint   Cold feet History of Present Illness   Hunter Townsend is a 81 y.o. year-old male with a history of CHF, A. fib, PAD presenting to the ED with chief complaint of cold feet.  Patient is endorsing cold feet for the past 5 years, was particularly bothersome this early morning as he could not get them warm.  Had them on the heating pad for multiple hours and that finally helped.  Endorsing a pins-and-needles sensation intermittently for the past 5 years as well.  Endorsing "arthritis all over my body".  Denies chest pain or shortness of breath, no numbness or weakness to the arms or legs.  No significant changes this evening that brought him to the emergency department, mostly just frustrated with his persistent symptoms.  Explains that he has had "every test done on me".  Review of Systems  A thorough review of systems was obtained and all systems are negative except as noted in the HPI and PMH.   Patient's Health History    Past Medical History:  Diagnosis Date   Arthritis    Atrial fibrillation (HCC)    BPH (benign prostatic hyperplasia)    CHF (congestive heart failure) ([place])    Coronary atherosclerosis of native coronary artery    a. s/p CABG in 1987 b. occlusion of all vein grafts and DES placed to LIMA-LAD in 2013, c. cath in 2017 showing moderate ISR of DES to LIMA-LAD and patent by repeat cath in 04/2017   Hypertension    Ischemic cardiomyopathy    EF 35-40% by cath 06/24/11 (EF 37% by nuclear stress test 06/2010).   Myocardial infarction [facility] [place]) 2013   PAD (peripheral artery disease) ([place])    Status post left SFA stent - Dr. Burt Knack    Past Surgical History:  Procedure Laterality Date   BACK SURGERY     "cut me ~ 1/2inch to take sciatic nerve out of my right leg"   CARDIAC CATHETERIZATION  03/28/2016   CARDIAC CATHETERIZATION N/A  03/28/2016   Procedure: Left Heart Cath and Cors/Grafts Angiography;  Surgeon: Leonie Man, MD;  Location: [place] [facility];  Service: Cardiovascular;  Laterality: N/A;   CARDIOVERSION N/A 09/21/2019   Procedure: CARDIOVERSION;  Surgeon: Skeet Latch, MD;  Location: [place];  Service: Cardiovascular;  Laterality: N/A;   CATARACT EXTRACTION W/ INTRAOCULAR LENS  IMPLANT, BILATERAL     CORONARY ANGIOPLASTY     CORONARY ARTERY [place], [place] - LIMA to LAD, SVG to RCA presumably   LEFT HEART CATH AND CORS/GRAFTS ANGIOGRAPHY N/A 05/12/2017   Procedure: LEFT HEART CATH AND CORS/GRAFTS ANGIOGRAPHY;  Surgeon: Burnell Blanks, MD;  Location: [place] [facility];  Service: Cardiovascular;  Laterality: N/A;   LEFT HEART CATHETERIZATION WITH CORONARY/GRAFT ANGIOGRAM  06/24/2011   Procedure: LEFT HEART CATHETERIZATION WITH Beatrix Fetters;  Surgeon: Burnell Blanks, MD;  Location: [facility] CATH LAB;  Service: Cardiovascular;;   LEFT HEART CATHETERIZATION WITH CORONARY/GRAFT ANGIOGRAM N/A 02/26/2013   Procedure: LEFT HEART CATHETERIZATION WITH Beatrix Fetters;  Surgeon: Blane Ohara, MD;  Location: [facility] CATH LAB;  Service: Cardiovascular;  Laterality: N/A;   LOWER EXTREMITY ANGIOGRAM N/A 10/30/2011   Procedure: LOWER EXTREMITY ANGIOGRAM;  Surgeon: Sherren Mocha, MD;  Location: [facility] CATH LAB;  Service: Cardiovascular;  Laterality: N/A;   PERCUTANEOUS CORONARY STENT INTERVENTION (PCI-S)  02/26/2013   Procedure: PERCUTANEOUS CORONARY STENT INTERVENTION (PCI-S);  Surgeon: Blane Ohara, MD;  Location: [facility] CATH LAB;  Service: Cardiovascular;;    Family History  Problem Relation Age of Onset   CAD Other        pt. says he is unsure of the relationship   Hypertension Other    Colon cancer Neg Hx    Gastric cancer Neg Hx    Esophageal cancer Neg Hx     Social History   Socioeconomic History   Marital status: Widowed    Spouse name: Not on file    Number of children: Not on file   Years of education: Not on file   Highest education level: Not on file  Occupational History   Occupation: Retired    Comment: Orthoptist  Tobacco Use   Smoking status: Former    Packs/day: 2.00    Years: 40.00    Pack years: 80.00    Types: Cigarettes    Quit date: 05/27/1993    Years since quitting: 28.0   Smokeless tobacco: Never  Vaping Use   Vaping Use: Never used  Substance and Sexual Activity   Alcohol use: No    Alcohol/week: 0.0 standard drinks   Drug use: No   Sexual activity: Not Currently  Other Topics Concern   Not on file  Social History Narrative   Not on file   Social Determinants of Health   Financial Resource Strain: Not on file  Food Insecurity: Not on file  Transportation Needs: Not on file  Physical Activity: Not on file  Stress: Not on file  Social Connections: Not on file  Intimate Partner Violence: Not on file     Physical Exam   Vitals:   05/29/21 0424 05/29/21 0540  BP: (!) 118/57 121/61  Pulse: 76 83  Resp: 16 20  Temp: 98 F (36.7 C)   SpO2: 95% 100%    CONSTITUTIONAL: Well-appearing, NAD NEURO:  Alert and oriented x 3, no focal deficits EYES:  eyes equal and reactive ENT/NECK:  no LAD, no JVD CARDIO: Regular rate, well-perfused, normal S1 and S2 PULM:  CTAB no wheezing or rhonchi GI/GU:  non-distended, non-tender MSK/SPINE:  No gross deformities, no edema SKIN:  no rash, atraumatic   *Additional and/or pertinent findings included in MDM below  Diagnostic and Interventional Summary    EKG Interpretation  Date/Time:    Ventricular Rate:    PR Interval:    QRS Duration:   QT Interval:    QTC Calculation:   R Axis:     Text Interpretation:         Labs Reviewed - No data to display  No orders to display    Medications - No data to display   Procedures  /  Critical Care Procedures  ED Course and Medical Decision Making  Initial Impression and Ddx Patient has normal-appearing  extremities, they are warm to the touch, cap refill is reassuring, strong DP pulses bilaterally.  Normal strength and sensation.  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  Patient does have a history of peripheral vascular disease, CAD, but at this time nothing to suggest emergent vascular pathology.  Raynaud's phenomenon a possibility, could benefit from rheumatology referral.  Interpretation of Diagnostics None.  Patient Reassessment and Ultimate Disposition/Management Patient is appropriate for discharge with reassurance.  Patient management required discussion with the following services or consulting groups:  None  Complexity of Problems Addressed Chronic illness with exacerbation  Additional Data Reviewed  and Analyzed Further history obtained from: Recent PCP notes  Patient Encounter Risk Assessment Low  Barth Kirks. [place] Small, [place] [email or web address]  Final Clinical Impressions(s) / ED Diagnoses     ICD-10-CM   1. Cold feet  R20.9     2. Body aches  R52       ED Discharge Orders     None        Discharge Instructions Discussed with and Provided to Patient:    Discharge Instructions      You were evaluated in the Emergency Department and after careful evaluation, we did not find any emergent condition requiring admission or further testing in the hospital.  Your exam/testing today is overall reassuring.  Nothing to suggest infection or blood clot or any other emergencies at this follow-up with your primary care doctor.  You could try increasing your dose of gabapentin to 200 mg 3 times daily to see if that helps your symptoms.  We also recommend follow-up with a rheumatologist to see if they have any other management ideas.  Please return to the Emergency Department if you experience any worsening of your condition.   Thank you for allowing [place] to be a part of your care.      Maudie Flakes, MD 05/29/21 [phone]

## 2021-05-29 NOTE — ED Notes (Signed)
Pt was discharged and taken to the lobby via wheelchair to wait for his ride home.

## 2021-06-06 ENCOUNTER — Telehealth: Payer: Self-pay | Admitting: Cardiology

## 2021-06-06 NOTE — Telephone Encounter (Signed)
Spoke with niece.  Concerned that he was taken off Entresto & blood thinner.  Explained to niece that the Delene Loll was stopped due to low BP & would make sense for blood thinner to be held based on GI bleed.  Will send message to provider for review of Mikel Cella notes (care everywhere) to clarify meds that he does not need to be on currently.  When does he need f/u?

## 2021-06-06 NOTE — Telephone Encounter (Signed)
Patient's niece called stating patient was recently in the hospital at [facility].  She is concerned about their decision to take him off two of his heart medications.

## 2021-06-07 ENCOUNTER — Encounter: Payer: Self-pay | Admitting: *Deleted

## 2021-06-07 NOTE — Telephone Encounter (Signed)
Hunter Townsend (niece) notified & verbalized understanding.  OV scheduled for Tuesday, 06/12/2021 with Dr. Manus Gunning office.

## 2021-06-12 ENCOUNTER — Ambulatory Visit (INDEPENDENT_AMBULATORY_CARE_PROVIDER_SITE_OTHER): Payer: Medicare Other | Admitting: Cardiology

## 2021-06-12 ENCOUNTER — Other Ambulatory Visit: Payer: Self-pay | Admitting: Cardiology

## 2021-06-12 ENCOUNTER — Encounter: Payer: Self-pay | Admitting: Cardiology

## 2021-06-12 VITALS — BP 118/58 | HR 62 | Ht 65.0 in | Wt 155.6 lb

## 2021-06-12 DIAGNOSIS — I480 Paroxysmal atrial fibrillation: Secondary | ICD-10-CM

## 2021-06-12 DIAGNOSIS — N1832 Chronic kidney disease, stage 3b: Secondary | ICD-10-CM

## 2021-06-12 DIAGNOSIS — Z8719 Personal history of other diseases of the digestive system: Secondary | ICD-10-CM

## 2021-06-12 DIAGNOSIS — I5020 Unspecified systolic (congestive) heart failure: Secondary | ICD-10-CM | POA: Diagnosis not present

## 2021-06-12 DIAGNOSIS — I25119 Atherosclerotic heart disease of native coronary artery with unspecified angina pectoris: Secondary | ICD-10-CM

## 2021-06-12 DIAGNOSIS — Z79899 Other long term (current) drug therapy: Secondary | ICD-10-CM

## 2021-06-12 MED ORDER — FUROSEMIDE 20 MG PO TABS: 20.0000 mg | ORAL_TABLET | Freq: Every day | ORAL | 1 refills | Status: DC

## 2021-06-12 NOTE — Progress Notes (Signed)
Cardiology Office Note  Date: 06/12/2021   ID: MATTIA LIFORD, DOB Jul 07, 1940, MRN [id number]  PCP:  Curlene Labrum, MD  Cardiologist:  Rozann Lesches, MD Electrophysiologist:  None   Chief Complaint  Patient presents with   Hospitalization Follow-up    History of Present Illness: GAGANDEEP PETTET is an 81 y.o. male last seen in December 2022.  He presents for a post hospital follow-up.  He was transferred to [facility] from [facility] with acute GI bleed.  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  Colonoscopy showed two small transverse colonic polyps which were resected as well as small internal hemorrhoids, but otherwise no acute source of blood loss.  He was taken off Eliquis and aspirin at that time.  Per record review it was not clear whether gastroenterology made recommendations regarding resumption of aspirin or Eliquis.  He was also taken off Entresto due to relatively low blood pressure at the time.  He presents for follow-up today, I am the first provider he is seeing after discharge, reports that he is seeing Dr. Pleas Koch next week.  Unclear what GI follow-up has been arranged.  He is fatigued, frustrated that he does not feel well.  He also states that he was not told any specifics about his recent hospital findings and plan.  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  He does not report any stool changes or obvious blood loss.  On his own he went back on all of his old medications including aspirin, Entresto, and Eliquis.  Vital signs are stable today.  He has not had any post hospital blood work as yet.  He does complain that his legs have been swelling more than normal over the last several weeks.  His weight is up a few pounds compared to December.  Past Medical History:  Diagnosis Date   Arthritis    Atrial fibrillation (HCC)    BPH (benign prostatic  hyperplasia)    CHF (congestive heart failure) ([place])    Coronary atherosclerosis of native coronary artery    a. s/p CABG in 1987 b. occlusion of all vein grafts and DES placed to LIMA-LAD in 2013, c. cath in 2017 showing moderate ISR of DES to LIMA-LAD and patent by repeat cath in 04/2017   Hypertension    Ischemic cardiomyopathy    EF 35-40% by cath 06/24/11 (EF 37% by nuclear stress test 06/2010).   Myocardial infarction [facility]) 2013   PAD (peripheral artery disease) ([place])    Status post left SFA stent - Dr. Burt Knack    Past Surgical History:  Procedure Laterality Date   BACK SURGERY     "cut me ~ 1/2inch to take sciatic nerve out of my right leg"   CARDIAC CATHETERIZATION  03/28/2016   CARDIAC CATHETERIZATION N/A 03/28/2016   Procedure: Left Heart Cath and Cors/Grafts Angiography;  Surgeon: Leonie Man, MD;  Location: [place] [facility];  Service: Cardiovascular;  Laterality: N/A;   CARDIOVERSION N/A 09/21/2019   Procedure: CARDIOVERSION;  Surgeon: Skeet Latch, MD;  Location: [place];  Service: Cardiovascular;  Laterality: N/A;   CATARACT EXTRACTION W/ INTRAOCULAR LENS  IMPLANT, BILATERAL     CORONARY ANGIOPLASTY     CORONARY ARTERY [place], [place] - LIMA to LAD, SVG to RCA presumably   LEFT HEART CATH AND CORS/GRAFTS ANGIOGRAPHY N/A 05/12/2017   Procedure: LEFT  HEART CATH AND CORS/GRAFTS ANGIOGRAPHY;  Surgeon: Burnell Blanks, MD;  Location: [place] [facility];  Service: Cardiovascular;  Laterality: N/A;   LEFT HEART CATHETERIZATION WITH CORONARY/GRAFT ANGIOGRAM  06/24/2011   Procedure: LEFT HEART CATHETERIZATION WITH Beatrix Fetters;  Surgeon: Burnell Blanks, MD;  Location: [facility] CATH LAB;  Service: Cardiovascular;;   LEFT HEART CATHETERIZATION WITH CORONARY/GRAFT ANGIOGRAM N/A 02/26/2013   Procedure: LEFT HEART CATHETERIZATION WITH Beatrix Fetters;  Surgeon: Blane Ohara, MD;  Location: [facility] CATH LAB;  Service:  Cardiovascular;  Laterality: N/A;   LOWER EXTREMITY ANGIOGRAM N/A 10/30/2011   Procedure: LOWER EXTREMITY ANGIOGRAM;  Surgeon: Sherren Mocha, MD;  Location: [facility] CATH LAB;  Service: Cardiovascular;  Laterality: N/A;   PERCUTANEOUS CORONARY STENT INTERVENTION (PCI-S)  02/26/2013   Procedure: PERCUTANEOUS CORONARY STENT INTERVENTION (PCI-S);  Surgeon: Blane Ohara, MD;  Location: [facility] CATH LAB;  Service: Cardiovascular;;    Current Outpatient Medications  Medication Sig Dispense Refill   acetaminophen (TYLENOL) 325 MG tablet Take 650 mg by mouth every 12 (twelve) hours as needed for moderate pain or headache.      amiodarone (PACERONE) 200 MG tablet TAKE 1/2 TABLET DAILY. 45 tablet 2   apixaban (ELIQUIS) 2.5 MG TABS tablet TAKE (1) TABLET TWICE DAILY. 60 tablet 5   gabapentin (NEURONTIN) 100 MG capsule Take 100 mg by mouth 3 (three) times daily.      latanoprost (XALATAN) 0.005 % ophthalmic solution Place 0.0005 drops into the left eye at bedtime as needed.     LORazepam (ATIVAN) 1 MG tablet Take 1 mg by mouth 2 (two) times daily.     midodrine (PROAMATINE) 5 MG tablet TAKE 1 TABLET TWICE DAILY WITH A MEAL. 180 tablet 2   Multiple Vitamin (MULTIVITAMIN) tablet Take 1 tablet by mouth daily.     nitroGLYCERIN (NITROSTAT) 0.4 MG SL tablet Place 1 tablet (0.4 mg total) under the tongue every 5 (five) minutes x 3 doses as needed for chest pain (if no relief after 3rd dose, proceed to the ED for an evaluation or call 911). For chest pain 25 tablet 3   Omega-3 Fatty Acids (FISH OIL) 1000 MG CAPS Take 2,000 mg by mouth daily.     OVER THE COUNTER MEDICATION Place 1 application rectally as needed (constipation). Suppository to help with BM with Occasional fleet enema     pantoprazole (PROTONIX) 40 MG tablet Take 40 mg by mouth every evening.     ranolazine (RANEXA) 500 MG 12 hr tablet TAKE 1 TABLET EVERY 12 HOURS. 60 tablet 3   rosuvastatin (CRESTOR) 5 MG tablet Take 5 mg by mouth daily.      sacubitril-valsartan (ENTRESTO) 24-26 MG TAKE (1) TABLET TWICE DAILY. 60 tablet 11   triamcinolone cream (KENALOG) 0.1 % Apply 1 application topically daily as needed (rash).      furosemide (LASIX) 20 MG tablet Take 1 tablet (20 mg total) by mouth daily. 90 tablet 1   No current facility-administered medications for this visit.   Allergies:  Patient has no known allergies.   ROS: Difficulty sleeping.  Appetite poor but improving.  No syncope.  Physical Exam: VS:  BP (!) 118/58    Pulse 62    Ht 5\' 5"  (1.651 m)    Wt 155 lb 9.6 oz (70.6 kg)    SpO2 97%    BMI 25.89 kg/m , BMI Body mass index is 25.89 kg/m.  Wt Readings from Last 3 Encounters:  06/12/21 155 lb 9.6 oz (70.6 kg)  05/29/21 153 lb 9.6 oz (69.7 kg)  05/16/21 153 lb 9.6 oz (69.7 kg)    General: Elderly male, no distress. HEENT: Conjunctiva and lids normal, wearing a mask. Neck: Supple, no elevated JVP or carotid bruits, no thyromegaly. Lungs: Clear to auscultation, nonlabored breathing at rest. Cardiac: Regular rate and rhythm, no S3, 2/6 systolic murmur. Abdomen: Soft, nontender, bowel sounds present. Extremities: 2+ lower leg edema.  ECG:  An ECG dated 05/07/2021 was personally reviewed today and demonstrated:  Sinus rhythm with PVCs and LVH.  Recent Labwork:  October 2022: Cholesterol 131, triglycerides 108, HDL 45, LDL 65 January 2023: Hemoglobin 8.2, platelets 156, TSH 6.12, potassium 4.5, BUN 43, creatinine 1.42, AST 81, ALT 62, hemoglobin A1c 5.6%, pro-BNP 7218, high-sensitivity troponin I 18  Other Studies Reviewed Today:  Echocardiogram 02/15/2021:  1. Left ventricular ejection fraction, by estimation, is 30 to 35%. The  left ventricle has moderately decreased function. The left ventricle  demonstrates regional wall motion abnormalities (see scoring  diagram/findings for description). Left ventricular   diastolic parameters are indeterminate.   2. Right ventricular systolic function is normal. The right  ventricular  size is normal. There is normal pulmonary artery systolic pressure.   3. Left atrial size was severely dilated.   4. Right atrial size was mildly dilated.   5. The mitral valve is normal in structure. Mild mitral valve  regurgitation. No evidence of mitral stenosis.   6. The tricuspid valve is abnormal. Tricuspid valve regurgitation is mild  to moderate.   7. The aortic valve is tricuspid. There is mild calcification of the  aortic valve. There is mild thickening of the aortic valve. Aortic valve  regurgitation is mild. No aortic stenosis is present.   8. The inferior vena cava is normal in size with greater than 50%  respiratory variability, suggesting right atrial pressure of 3 mmHg.   9. Probable small PFO with left to right shunt.  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 Stable.   Left Carotid: Velocities in the left ICA are consistent with a 40-59%  stenosis.                Stable.   Vertebrals:  Bilateral vertebral arteries demonstrate antegrade flow.  Subclavians: Normal flow hemodynamics were seen in bilateral subclavian               arteries.   Assessment and Plan:  1.  HFrEF with chronic combined heart failure and ischemic cardiomyopathy, LVEF 30 to 35% by most recent evaluation.  Weight is up a few pounds and he does have more prominent leg edema.  On his own he went back on Entresto following recent hospital discharge from [place].  Will plan to continue along with midodrine, increase Lasix to 20 mg daily from every other day.  Recheck BMET.  2.  Multivessel CAD status post CABG with graft disease and prior DES intervention.  For now would stop aspirin in light of recent GI bleed.  He does not describe any angina and there was not clear evidence of ACS by high-sensitivity troponin I levels during his recent presentation.  Continue Ranexa.  3.  Paroxysmal atrial  fibrillation with CHA2DS2-VASc score of 5.  He already went back on Eliquis 2.5 mg twice daily which we will continue for now.  Stopping aspirin as noted above.  Continue amiodarone.  Check CBC.  4.  CKD stage IIIb, last creatinine 1.42 was stable.  5.  Recent GI bleed requiring PRBC transfusions.  EGD and colonoscopy results are noted above.  Patient underwent ablation of gastric AVM which was source of his bleed.  I discussed this with him today.  Medication Adjustments/Labs and Tests Ordered: Current medicines are reviewed at length with the patient today.  Concerns regarding medicines are outlined above.   Tests Ordered: Orders Placed This Encounter  Procedures   CBC   Basic metabolic panel    Medication Changes: Meds ordered this encounter  Medications   furosemide (LASIX) 20 MG tablet    Sig: Take 1 tablet (20 mg total) by mouth daily.    Dispense:  90 tablet    Refill:  1    06/12/2021 change in directions    Disposition:  Follow up  1 month.  Signed, Satira Sark, MD, [facility] 06/12/2021 3:49 PM    [facility] [place], [place], [place] [address] Phone: [phone]; Fax: [phone]

## 2021-06-12 NOTE — Patient Instructions (Addendum)
Medication Instructions:  Your physician has recommended you make the following change in your medication:  Stop aspirin Change furosemide 20 mg to taking one daily Continue other medications the same  Labwork: BMET & CBC today at [facility]  Testing/Procedures: none  Follow-Up: Your physician recommends that you schedule a follow-up appointment in: 1 month  Any Other Special Instructions Will Be Listed Below (If Applicable).  If you need a refill on your cardiac medications before your next appointment, please call your pharmacy.

## 2021-06-13 ENCOUNTER — Telehealth: Payer: Self-pay | Admitting: *Deleted

## 2021-06-13 DIAGNOSIS — I5020 Unspecified systolic (congestive) heart failure: Secondary | ICD-10-CM

## 2021-06-13 DIAGNOSIS — Z79899 Other long term (current) drug therapy: Secondary | ICD-10-CM

## 2021-06-13 NOTE — Telephone Encounter (Signed)
-----   Message from Satira Sark, MD sent at 06/13/2021 12:05 PM EST ----- Results reviewed.  Hemoglobin is 9.0 which is higher than recent hospital stay which is reassuring in terms of his recent bleeding.  Creatinine 1.92 has increased however and his potassium is also elevated at 5.2.  For now I would suggest stopping Entresto, we can reconsider this later depending on how he does on higher dose Lasix.  Keep follow-up in place with repeat BMET for that visit.

## 2021-06-15 ENCOUNTER — Other Ambulatory Visit: Payer: Self-pay | Admitting: *Deleted

## 2021-06-15 DIAGNOSIS — I5020 Unspecified systolic (congestive) heart failure: Secondary | ICD-10-CM

## 2021-06-15 DIAGNOSIS — Z79899 Other long term (current) drug therapy: Secondary | ICD-10-CM

## 2021-06-15 NOTE — Telephone Encounter (Signed)
Patient informed and verbalized understanding of plan. Lab order faxed to [facility] 

## 2021-06-15 NOTE — Telephone Encounter (Signed)
Answered phone but was unable to talk-request that nurse call him back

## 2021-06-26 ENCOUNTER — Telehealth: Payer: Self-pay | Admitting: Cardiology

## 2021-06-26 MED ORDER — AMIODARONE HCL 200 MG PO TABS: 100.0000 mg | ORAL_TABLET | ORAL | 2 refills | Status: AC

## 2021-06-26 NOTE — Telephone Encounter (Signed)
Patient informed and verbalized understanding of plan. 

## 2021-06-26 NOTE — Telephone Encounter (Signed)
Pt c/o medication issue:  1. Name of Medication:  amiodarone (PACERONE) 200 MG  2. How are you currently taking this medication (dosage and times per day)?  As prescribed 3. Are you having a reaction (difficulty breathing--STAT)?   4. What is your medication issue?   Dr. Pleas Koch with [facility] is requesting to speak with [name]. He states the patient has been having various issues, he assumes may be related to Amiodarone. Please return call to discuss further as able.

## 2021-07-12 ENCOUNTER — Telehealth: Payer: Self-pay | Admitting: Cardiology

## 2021-07-12 ENCOUNTER — Ambulatory Visit (INDEPENDENT_AMBULATORY_CARE_PROVIDER_SITE_OTHER): Payer: Medicare Other | Admitting: Cardiology

## 2021-07-12 ENCOUNTER — Encounter: Payer: Self-pay | Admitting: Cardiology

## 2021-07-12 ENCOUNTER — Other Ambulatory Visit: Payer: Self-pay | Admitting: Cardiology

## 2021-07-12 VITALS — BP 138/78 | HR 84 | Ht 64.5 in | Wt 141.0 lb

## 2021-07-12 DIAGNOSIS — N1832 Chronic kidney disease, stage 3b: Secondary | ICD-10-CM | POA: Diagnosis not present

## 2021-07-12 DIAGNOSIS — I480 Paroxysmal atrial fibrillation: Secondary | ICD-10-CM

## 2021-07-12 DIAGNOSIS — I5020 Unspecified systolic (congestive) heart failure: Secondary | ICD-10-CM | POA: Diagnosis not present

## 2021-07-12 DIAGNOSIS — I25119 Atherosclerotic heart disease of native coronary artery with unspecified angina pectoris: Secondary | ICD-10-CM | POA: Diagnosis not present

## 2021-07-12 MED ORDER — PANTOPRAZOLE SODIUM 40 MG PO TBEC: 40.0000 mg | DELAYED_RELEASE_TABLET | Freq: Every evening | ORAL | 2 refills | Status: AC

## 2021-07-12 NOTE — Telephone Encounter (Signed)
Dawn informed via voicemail that patient should not be on plavix-d/c'd 04/2019.

## 2021-07-12 NOTE — Progress Notes (Signed)
Cardiology Office Note  Date: 07/12/2021   ID: NGUYEN TODOROV, DOB 01/14/1941, MRN [id number]  PCP:  Curlene Labrum, MD  Cardiologist:  Rozann Lesches, MD Electrophysiologist:  None   Chief Complaint  Patient presents with   Cardiac follow-up    History of Present Illness: Hunter Townsend is an 81 y.o. male last seen in January.  He is here for a follow-up visit.  His main concern today is that we review his medication list, there are some inconsistencies and he is not entirely certain about what he should and should not be taking.  I went over list given to him by his PCP, and we checked this with our list.  He states that he feels better since last month.  Less short of breath.  Somewhat more energy.  No obvious angina symptoms.  Has been anxious about his health.  Since last visit amiodarone was decreased to 100 mg every other day.  I asked him to stop omega-3 supplements, we resumed Protonix which he had not been taking, and clarified that he should no longer be on Plavix.  His list as noted below is the current regimen.  I reviewed his most recent lab work, creatinine was down to 1.55 with normal potassium.  Past Medical History:  Diagnosis Date   Arthritis    Atrial fibrillation (HCC)    BPH (benign prostatic hyperplasia)    CHF (congestive heart failure) ([place])    Coronary atherosclerosis of native coronary artery    a. s/p CABG in 1987 b. occlusion of all vein grafts and DES placed to LIMA-LAD in 2013, c. cath in 2017 showing moderate ISR of DES to LIMA-LAD and patent by repeat cath in 04/2017   Hypertension    Ischemic cardiomyopathy    EF 35-40% by cath 06/24/11 (EF 37% by nuclear stress test 06/2010).   Myocardial infarction [facility]) 2013   PAD (peripheral artery disease) ([place])    Status post left SFA stent - Dr. Burt Knack    Past Surgical History:  Procedure Laterality Date   BACK SURGERY     "cut me ~ 1/2inch to take sciatic nerve out of my right leg"    CARDIAC CATHETERIZATION  03/28/2016   CARDIAC CATHETERIZATION N/A 03/28/2016   Procedure: Left Heart Cath and Cors/Grafts Angiography;  Surgeon: Leonie Man, MD;  Location: [place] [facility];  Service: Cardiovascular;  Laterality: N/A;   CARDIOVERSION N/A 09/21/2019   Procedure: CARDIOVERSION;  Surgeon: Skeet Latch, MD;  Location: [place];  Service: Cardiovascular;  Laterality: N/A;   CATARACT EXTRACTION W/ INTRAOCULAR LENS  IMPLANT, BILATERAL     CORONARY ANGIOPLASTY     CORONARY ARTERY [place], CT - LIMA to LAD, SVG to RCA presumably   LEFT HEART CATH AND CORS/GRAFTS ANGIOGRAPHY N/A 05/12/2017   Procedure: LEFT HEART CATH AND CORS/GRAFTS ANGIOGRAPHY;  Surgeon: Burnell Blanks, MD;  Location: [place] [facility];  Service: Cardiovascular;  Laterality: N/A;   LEFT HEART CATHETERIZATION WITH CORONARY/GRAFT ANGIOGRAM  06/24/2011   Procedure: LEFT HEART CATHETERIZATION WITH Beatrix Fetters;  Surgeon: Burnell Blanks, MD;  Location: [facility] CATH LAB;  Service: Cardiovascular;;   LEFT HEART CATHETERIZATION WITH CORONARY/GRAFT ANGIOGRAM N/A 02/26/2013   Procedure: LEFT HEART CATHETERIZATION WITH Beatrix Fetters;  Surgeon: Blane Ohara, MD;  Location: [facility] CATH LAB;  Service: Cardiovascular;  Laterality: N/A;   LOWER EXTREMITY ANGIOGRAM N/A 10/30/2011   Procedure: LOWER EXTREMITY ANGIOGRAM;  Surgeon: Legrand Como  Burt Knack, MD;  Location: [facility] CATH LAB;  Service: Cardiovascular;  Laterality: N/A;   PERCUTANEOUS CORONARY STENT INTERVENTION (PCI-S)  02/26/2013   Procedure: PERCUTANEOUS CORONARY STENT INTERVENTION (PCI-S);  Surgeon: Blane Ohara, MD;  Location: [facility] CATH LAB;  Service: Cardiovascular;;    Current Outpatient Medications  Medication Sig Dispense Refill   acetaminophen (TYLENOL) 325 MG tablet Take 650 mg by mouth every 12 (twelve) hours as needed for moderate pain or headache.      amiodarone (PACERONE) 200 MG tablet Take 0.5 tablets  (100 mg total) by mouth every other day. 35 tablet 2   apixaban (ELIQUIS) 2.5 MG TABS tablet TAKE (1) TABLET TWICE DAILY. 60 tablet 5   furosemide (LASIX) 20 MG tablet Take 2 tablets in the morning and 1 tablet in the afternoon     gabapentin (NEURONTIN) 100 MG capsule Take 100 mg by mouth 3 (three) times daily.      latanoprost (XALATAN) 0.005 % ophthalmic solution Place 0.0005 drops into the left eye at bedtime as needed.     levothyroxine (SYNTHROID) 50 MCG tablet Take 50 mcg by mouth daily.     LORazepam (ATIVAN) 1 MG tablet Take 1 mg by mouth 2 (two) times daily.     midodrine (PROAMATINE) 5 MG tablet TAKE 1 TABLET TWICE DAILY WITH A MEAL. 180 tablet 2   Multiple Vitamin (MULTIVITAMIN) tablet Take 1 tablet by mouth daily.     nitroGLYCERIN (NITROSTAT) 0.4 MG SL tablet Place 1 tablet (0.4 mg total) under the tongue every 5 (five) minutes x 3 doses as needed for chest pain (if no relief after 3rd dose, proceed to the ED for an evaluation or call 911). For chest pain 25 tablet 3   OVER THE COUNTER MEDICATION Place 1 application rectally as needed (constipation). Suppository to help with BM with Occasional fleet enema     ranolazine (RANEXA) 500 MG 12 hr tablet TAKE 1 TABLET EVERY 12 HOURS. 60 tablet 3   rosuvastatin (CRESTOR) 5 MG tablet Take 5 mg by mouth daily.     triamcinolone cream (KENALOG) 0.1 % Apply 1 application topically daily as needed (rash).      pantoprazole (PROTONIX) 40 MG tablet Take 1 tablet (40 mg total) by mouth every evening. 90 tablet 2   No current facility-administered medications for this visit.   Allergies:  Patient has no known allergies.   ROS: No syncope.  Physical Exam: VS:  BP 138/78    Pulse 84    Ht 5' 4.5" (1.638 m)    Wt 141 lb (64 kg)    SpO2 98%    BMI 23.83 kg/m , BMI Body mass index is 23.83 kg/m.  Wt Readings from Last 3 Encounters:  07/12/21 141 lb (64 kg)  06/12/21 155 lb 9.6 oz (70.6 kg)  05/29/21 153 lb 9.6 oz (69.7 kg)    General:  Patient appears comfortable at rest. HEENT: Conjunctiva and lids normal, wearing a mask. Neck: Supple, no elevated JVP or carotid bruits, no thyromegaly. Lungs: Clear to auscultation, nonlabored breathing at rest. Cardiac: Regular rate and rhythm, no S3, 2/6 systolic murmur. Extremities: No pitting edema.  ECG:  An ECG dated 05/07/2021 was personally reviewed today and demonstrated:  Sinus rhythm with PVCs and LVH.  Recent Labwork:  January 2023: Hemoglobin 9.0, platelets 223 February 2023: Potassium 4.0, BUN 21, creatinine 1.55  Other Studies Reviewed Today:  Echocardiogram 02/15/2021:  1. Left ventricular ejection fraction, by estimation, is 30 to 35%. The  left ventricle has moderately decreased function. The left ventricle  demonstrates regional wall motion abnormalities (see scoring  diagram/findings for description). Left ventricular   diastolic parameters are indeterminate.   2. Right ventricular systolic function is normal. The right ventricular  size is normal. There is normal pulmonary artery systolic pressure.   3. Left atrial size was severely dilated.   4. Right atrial size was mildly dilated.   5. The mitral valve is normal in structure. Mild mitral valve  regurgitation. No evidence of mitral stenosis.   6. The tricuspid valve is abnormal. Tricuspid valve regurgitation is mild  to moderate.   7. The aortic valve is tricuspid. There is mild calcification of the  aortic valve. There is mild thickening of the aortic valve. Aortic valve  regurgitation is mild. No aortic stenosis is present.   8. The inferior vena cava is normal in size with greater than 50%  respiratory variability, suggesting right atrial pressure of 3 mmHg.   9. Probable small PFO with left to right shunt.   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 Stable.   Left Carotid: Velocities in the left ICA are  consistent with a 40-59%  stenosis.                Stable.   Vertebrals:  Bilateral vertebral arteries demonstrate antegrade flow.  Subclavians: Normal flow hemodynamics were seen in bilateral subclavian               arteries.   Assessment and Plan:  1.  HFrEF with ischemic cardiomyopathy, LVEF 30 to 35%.  Medications have been simplified in the setting of comorbid illness and relatively low blood pressures.  He is currently on Lasix and ProAmatine.  No beta-blocker with prior symptomatic bradycardia.  Fluctuating renal insufficiency precludes Aldactone and he is no longer on Entresto.  2.  Multivessel CAD status post CABG with graft disease and previous DES intervention.  No longer on antiplatelet therapy with significant GI bleed history.  He is on Ranexa and Crestor.  3.  CKD stage IIIb, recent follow-up creatinine 1.55.  4.  Paroxysmal atrial fibrillation with CHA2DS2-VASc score of 5.  He continues on Eliquis 2.5 mg twice daily.  Also on very low-dose amiodarone.  5.  History of GI bleed status post ablation of gastric AVM.  Resume Protonix.  Medication Adjustments/Labs and Tests Ordered: Current medicines are reviewed at length with the patient today.  Concerns regarding medicines are outlined above.   Tests Ordered: No orders of the defined types were placed in this encounter.   Medication Changes: Meds ordered this encounter  Medications   pantoprazole (PROTONIX) 40 MG tablet    Sig: Take 1 tablet (40 mg total) by mouth every evening.    Dispense:  90 tablet    Refill:  2    Disposition:  Follow up  3 months.  Signed, Satira Sark, MD, [facility] 07/12/2021 3:22 PM    [facility] [place], [place],  [address] Phone: [phone]; Fax: [phone]

## 2021-07-12 NOTE — Telephone Encounter (Signed)
Pt c/o medication issue:  1. Name of Medication: Plavix  2. How are you currently taking this medication (dosage and times per day)?    3. Are you having a reaction (difficulty breathing--STAT)? no  4. What is your medication issue? Was calling to request that patient stop taking Plavix do to the itching and rash that he having. Inform them that Plavix is not listed as his current medication. The patient has been taking this medication for a while. Nurse needs to know what should be done next

## 2021-07-12 NOTE — Patient Instructions (Addendum)
Medication Instructions:  Your physician has recommended you make the following change in your medication: Stop fish oil  Restart pantoprazole 40 mg daily Continue other medications the same  Labwork: none  Testing/Procedures: none  Follow-Up: Your physician recommends that you schedule a follow-up appointment in: 3 months  Any Other Special Instructions Will Be Listed Below (If Applicable).  If you need a refill on your cardiac medications before your next appointment, please call your pharmacy.

## 2021-07-21 ENCOUNTER — Other Ambulatory Visit: Payer: Self-pay | Admitting: Cardiology

## 2021-07-23 NOTE — Telephone Encounter (Signed)
Pt last saw Dr Domenic Polite 07/12/21, last labs 07/23/21 Creat 1.60, age 81, weight 64kg, based on specified criteria pt is on appropriate dosage of Eliquis 2.5mg  BID for afib.  Will refill rx.

## 2021-08-27 ENCOUNTER — Ambulatory Visit: Payer: Medicare Other | Admitting: Cardiology

## 2021-08-27 NOTE — Progress Notes (Deleted)
? ? ?Cardiology Office Note ? ?Date: 08/27/2021  ? ?ID: Hunter Townsend, DOB 03-16-1941, MRN [id number] ? ?PCP:  Curlene Labrum, MD  ?Cardiologist:  Rozann Lesches, MD ?Electrophysiologist:  None  ? ?No chief complaint on file. ? ? ?History of Present Illness: ?Hunter Townsend is an 81 y.o. male last seen in February. ? ?Records indicate hospitalization at [facility] in late February with acute on chronic HFrEF.  He was treated with IV diuresis, discharged to rehabilitation stay. ? ?Past Medical History:  ?Diagnosis Date  ? Arthritis   ? Atrial fibrillation ([place])   ? BPH (benign prostatic hyperplasia)   ? CHF (congestive heart failure) ([place])   ? Coronary atherosclerosis of native coronary artery   ? a. s/p CABG in 1987 b. occlusion of all vein grafts and DES placed to LIMA-LAD in 2013, c. cath in 2017 showing moderate ISR of DES to LIMA-LAD and patent by repeat cath in 04/2017  ? Hypertension   ? Ischemic cardiomyopathy   ? EF 35-40% by cath 06/24/11 (EF 37% by nuclear stress test 06/2010).  ? Myocardial infarction [facility]) 2013  ? PAD (peripheral artery disease) ([place])   ? Status post left SFA stent - Dr. Burt Knack  ? ? ?Past Surgical History:  ?Procedure Laterality Date  ? BACK SURGERY    ? "cut me ~ 1/2inch to take sciatic nerve out of my right leg"  ? CARDIAC CATHETERIZATION  03/28/2016  ? CARDIAC CATHETERIZATION N/A 03/28/2016  ? Procedure: Left Heart Cath and Cors/Grafts Angiography;  Surgeon: Leonie Man, MD;  Location: [place] [facility];  Service: Cardiovascular;  Laterality: N/A;  ? CARDIOVERSION N/A 09/21/2019  ? Procedure: CARDIOVERSION;  Surgeon: Skeet Latch, MD;  Location: [facility] ENDOSCOPY;  Service: Cardiovascular;  Laterality: N/A;  ? CATARACT EXTRACTION W/ INTRAOCULAR LENS  IMPLANT, BILATERAL    ? CORONARY ANGIOPLASTY    ? CORONARY ARTERY BYPASS GRAFT  1987  ? [place], [place] - LIMA to LAD, SVG to RCA presumably  ? LEFT HEART CATH AND CORS/GRAFTS ANGIOGRAPHY N/A 05/12/2017  ? Procedure: LEFT HEART  CATH AND CORS/GRAFTS ANGIOGRAPHY;  Surgeon: Burnell Blanks, MD;  Location: [place] [facility];  Service: Cardiovascular;  Laterality: N/A;  ? LEFT HEART CATHETERIZATION WITH CORONARY/GRAFT ANGIOGRAM  06/24/2011  ? Procedure: LEFT HEART CATHETERIZATION WITH Beatrix Fetters;  Surgeon: Burnell Blanks, MD;  Location: [facility] CATH LAB;  Service: Cardiovascular;;  ? LEFT HEART CATHETERIZATION WITH CORONARY/GRAFT ANGIOGRAM N/A 02/26/2013  ? Procedure: LEFT HEART CATHETERIZATION WITH Beatrix Fetters;  Surgeon: Blane Ohara, MD;  Location: [facility] CATH LAB;  Service: Cardiovascular;  Laterality: N/A;  ? LOWER EXTREMITY ANGIOGRAM N/A 10/30/2011  ? Procedure: LOWER EXTREMITY ANGIOGRAM;  Surgeon: Sherren Mocha, MD;  Location: [facility] CATH LAB;  Service: Cardiovascular;  Laterality: N/A;  ? PERCUTANEOUS CORONARY STENT INTERVENTION (PCI-S)  02/26/2013  ? Procedure: PERCUTANEOUS CORONARY STENT INTERVENTION (PCI-S);  Surgeon: Blane Ohara, MD;  Location: [facility] CATH LAB;  Service: Cardiovascular;;  ? ? ?Current Outpatient Medications  ?Medication Sig Dispense Refill  ? acetaminophen (TYLENOL) 325 MG tablet Take 650 mg by mouth every 12 (twelve) hours as needed for moderate pain or headache.     ? amiodarone (PACERONE) 200 MG tablet Take 0.5 tablets (100 mg total) by mouth every other day. 35 tablet 2  ? apixaban (ELIQUIS) 2.5 MG TABS tablet TAKE (1) TABLET TWICE DAILY. 60 tablet 6  ? furosemide (LASIX) 20 MG tablet Take 2 tablets in the morning and 1 tablet  in the afternoon    ? gabapentin (NEURONTIN) 100 MG capsule Take 100 mg by mouth 3 (three) times daily.     ? latanoprost (XALATAN) 0.005 % ophthalmic solution Place 0.0005 drops into the left eye at bedtime as needed.    ? levothyroxine (SYNTHROID) 50 MCG tablet Take 50 mcg by mouth daily.    ? LORazepam (ATIVAN) 1 MG tablet Take 1 mg by mouth 2 (two) times daily.    ? midodrine (PROAMATINE) 5 MG tablet TAKE 1 TABLET TWICE DAILY WITH A MEAL. 180 tablet 2  ?  Multiple Vitamin (MULTIVITAMIN) tablet Take 1 tablet by mouth daily.    ? nitroGLYCERIN (NITROSTAT) 0.4 MG SL tablet Place 1 tablet (0.4 mg total) under the tongue every 5 (five) minutes x 3 doses as needed for chest pain (if no relief after 2nd dose, proceed to ED or call 911). 25 tablet 3  ? OVER THE COUNTER MEDICATION Place 1 application rectally as needed (constipation). Suppository to help with BM with Occasional fleet enema    ? pantoprazole (PROTONIX) 40 MG tablet Take 1 tablet (40 mg total) by mouth every evening. 90 tablet 2  ? ranolazine (RANEXA) 500 MG 12 hr tablet TAKE 1 TABLET EVERY 12 HOURS. 60 tablet 3  ? rosuvastatin (CRESTOR) 5 MG tablet Take 5 mg by mouth daily.    ? triamcinolone cream (KENALOG) 0.1 % Apply 1 application topically daily as needed (rash).     ? ?No current facility-administered medications for this visit.  ? ?Allergies:  Patient has no known allergies.  ? ?Social History: The patient  reports that he quit smoking about 28 years ago. His smoking use included cigarettes. He has a 80.00 pack-year smoking history. He has never used smokeless tobacco. He reports that he does not drink alcohol and does not use drugs.  ? ?Family History: The patient's family history includes CAD in an other family member; Hypertension in an other family member.  ? ?ROS:  Please see the history of present illness. Otherwise, complete review of systems is positive for {NONE DEFAULTED:18576}.  All other systems are reviewed and negative.  ? ?Physical Exam: ?VS:  There were no vitals taken for this visit., BMI There is no height or weight on file to calculate BMI. ? ?Wt Readings from Last 3 Encounters:  ?07/12/21 141 lb (64 kg)  ?06/12/21 155 lb 9.6 oz (70.6 kg)  ?05/29/21 153 lb 9.6 oz (69.7 kg)  ?  ?General: Patient appears comfortable at rest. ?HEENT: Conjunctiva and lids normal, oropharynx clear with moist mucosa. ?Neck: Supple, no elevated JVP or carotid bruits, no thyromegaly. ?Lungs: Clear to  auscultation, nonlabored breathing at rest. ?Cardiac: Regular rate and rhythm, no S3 or significant systolic murmur, no pericardial rub. ?Abdomen: Soft, nontender, no hepatomegaly, bowel sounds present, no guarding or rebound. ?Extremities: No pitting edema, distal pulses 2+. ?Skin: Warm and dry. ?Musculoskeletal: No kyphosis. ?Neuropsychiatric: Alert and oriented x3, affect grossly appropriate. ? ?ECG:  An ECG dated 05/07/2021 was personally reviewed today and demonstrated:  Sinus rhythm with PVCs and LVH. ? ?Recent Labwork: ? ?January 2023: TSH 6.12 ?February 2023: Pro-BNP 20218 ?March 2023: BUN 41, creatinine 1.67, potassium 4.7, hemoglobin 9.5, platelets 298 ? ?Other Studies Reviewed Today: ? ?Echocardiogram 02/15/2021: ? 1. Left ventricular ejection fraction, by estimation, is 30 to 35%. The  ?left ventricle has moderately decreased function. The left ventricle  ?demonstrates regional wall motion abnormalities (see scoring  ?diagram/findings for description). Left ventricular  ? diastolic parameters are  indeterminate.  ? 2. Right ventricular systolic function is normal. The right ventricular  ?size is normal. There is normal pulmonary artery systolic pressure.  ? 3. Left atrial size was severely dilated.  ? 4. Right atrial size was mildly dilated.  ? 5. The mitral valve is normal in structure. Mild mitral valve  ?regurgitation. No evidence of mitral stenosis.  ? 6. The tricuspid valve is abnormal. Tricuspid valve regurgitation is mild  ?to moderate.  ? 7. The aortic valve is tricuspid. There is mild calcification of the  ?aortic valve. There is mild thickening of the aortic valve. Aortic valve  ?regurgitation is mild. No aortic stenosis is present.  ? 8. The inferior vena cava is normal in size with greater than 50%  ?respiratory variability, suggesting right atrial pressure of 3 mmHg.  ? 9. Probable small PFO with left to right shunt. ?  ?Carotid Dopplers 03/15/2021: ?Summary:  ?Right Carotid: Velocities in the  right ICA are consistent with a 40-59%  ?               stenosis. Stable.  ? ?Left Carotid: Velocities in the left ICA are consistent with a 40-59%  ?stenosis.  ?              Stable.  ? ?Vertebrals:  Bilater

## 2021-09-24 DEATH — deceased

## 2021-10-09 ENCOUNTER — Ambulatory Visit: Payer: Medicare Other | Admitting: Cardiology

## 2022-06-25 IMAGING — MR MR HEAD WO/W CM
15 of 17 series · 32 of 48 positions shown · IV contrast (7 ML Gadavist)
Comparison: 12/29/2015

CLINICAL DATA: Frontal lobe brain tumor follow-up.

EXAM:
MRI HEAD WITHOUT AND WITH CONTRAST
TECHNIQUE: Multiplanar, multiecho pulse sequences of the brain and surrounding
structures were obtained without and with intravenous contrast.
CONTRAST:  7mL GADAVIST GADOBUTROL 1 MMOL/ML IV SOLN

[Series 5: DWI · axial · 3.0mm · 0.77mm/px · z∈[-7,+140]mm · 2 of 50 slices shown (1 of 6)]
[im 1/50]
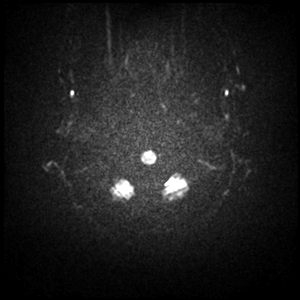
[im 50/50]
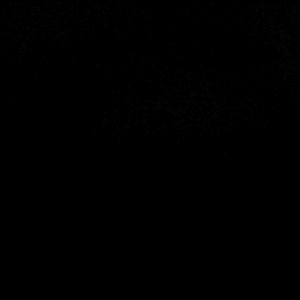

[Series 5: DWI · axial · 3.0mm · 0.77mm/px · z∈[-7,+140]mm · 2 of 50 slices shown (2 of 6)]
[im 1/50]
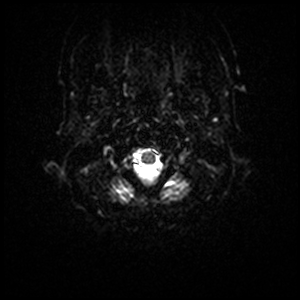
[im 50/50]
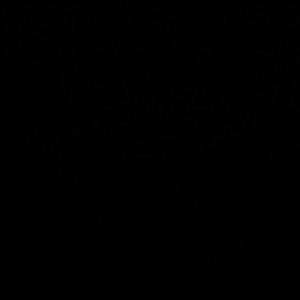

[Series 6: DWI · axial · 3.0mm · 0.77mm/px · z∈[-7,+137]mm · 3 of 49 slices shown (3 of 6)]
[im 1/49]
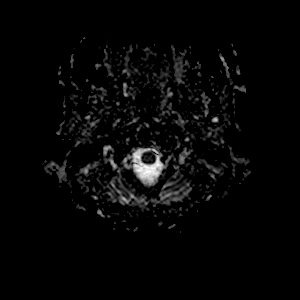
[im 25/49]
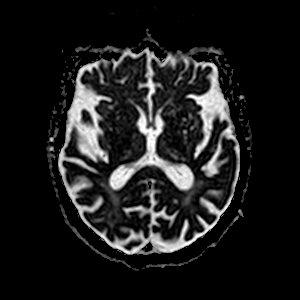
[im 49/49]
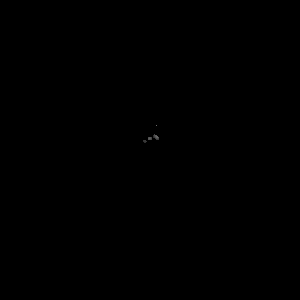

[Series 7: DWI · coronal · 5.0mm · 0.88mm/px · 2 of 27 slices shown (4 of 6)]
[im 1/27]
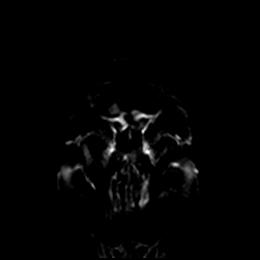
[im 27/27]
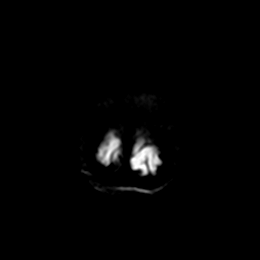

[Series 7: DWI · coronal · 5.0mm · 0.88mm/px · 2 of 28 slices shown (5 of 6)]
[im 1/28]
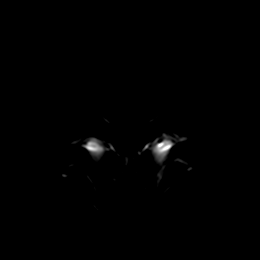
[im 28/28]
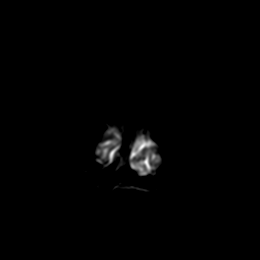

[Series 8: DWI · coronal · 5.0mm · 0.88mm/px · 2 of 28 slices shown (6 of 6)]
[im 1/28]
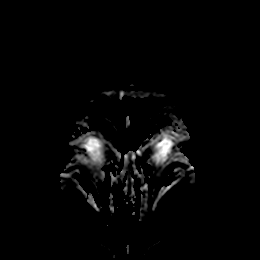
[im 28/28]
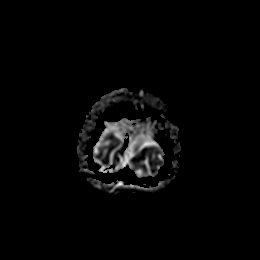

[Series 9: T1 · sagittal · 5.0mm · 0.75mm/px · 1 of 19 slices shown]
[im 1/19]
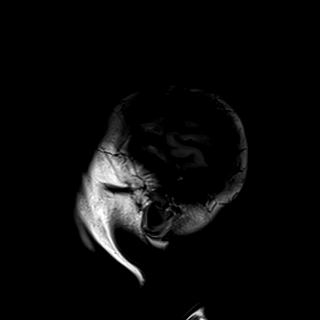

[Series 10: T2 · axial · 5.0mm · 0.72mm/px · 1 of 23 slices shown]
[im 1/23]
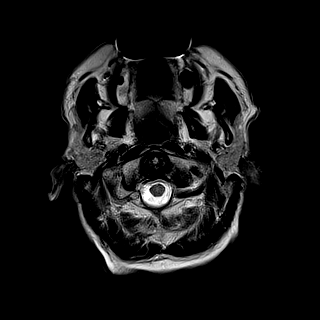

[Series 11: mag_images · axial · 3.0mm · 0.90mm/px · z∈[-15,+162]mm · 3 of 60 slices shown]
[im 1/60]
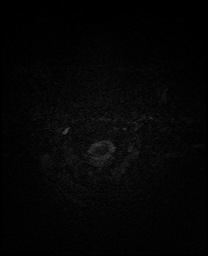
[im 30/60]
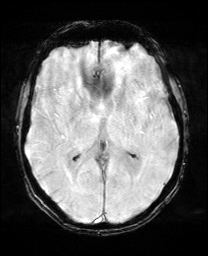
[im 60/60]
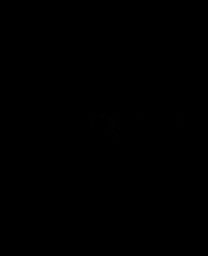

[Series 12: pha_images · axial · 3.0mm · 0.90mm/px · z∈[-15,+153]mm · 3 of 56 slices shown]
[im 1/56]
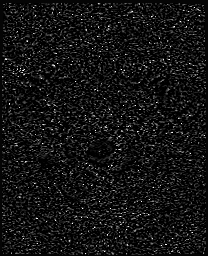
[im 28/56]
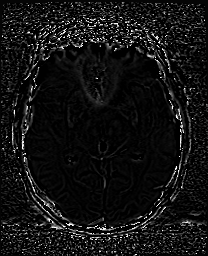
[im 56/56]
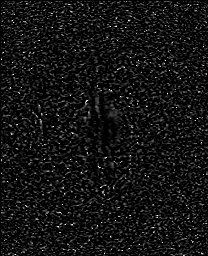

[Series 13: swi_images · axial · 3.0mm · 0.90mm/px · z∈[-15,+162]mm · 3 of 60 slices shown]
[im 1/60]
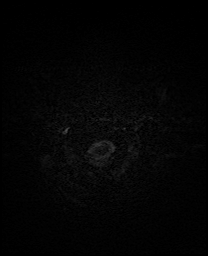
[im 30/60]
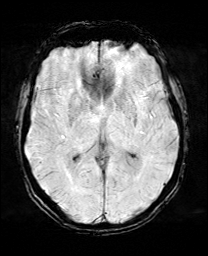
[im 60/60]
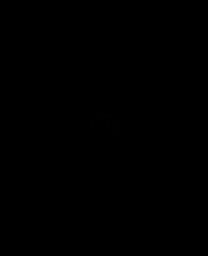

[Series 15: FLAIR · axial · 3.0mm · 0.45mm/px · z∈[+3,+144]mm · 3 of 48 slices shown]
[im 1/48]
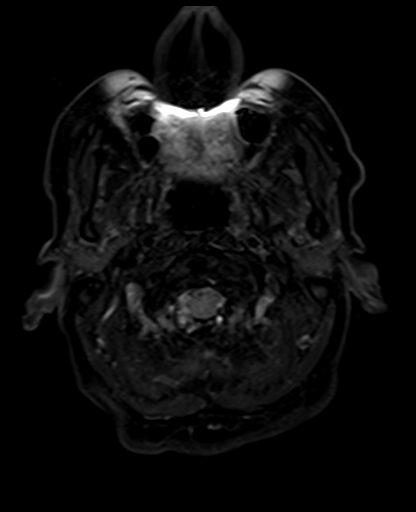
[im 24/48]
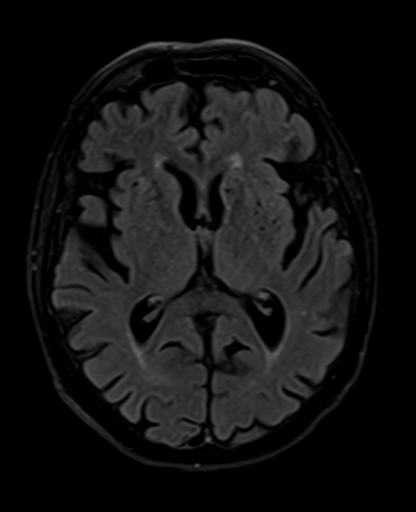
[im 48/48]
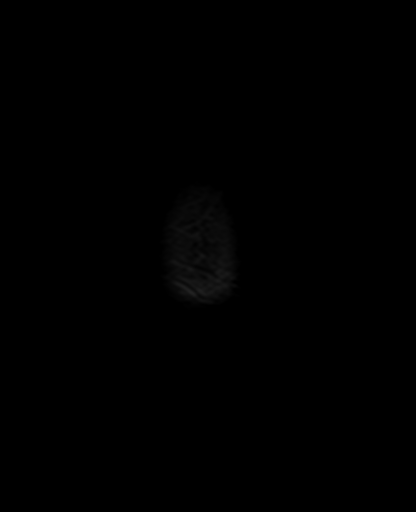

[Series 17: T2 post-contrast · coronal · 5.0mm · 0.72mm/px · 2 of 28 slices shown]
[im 1/28]
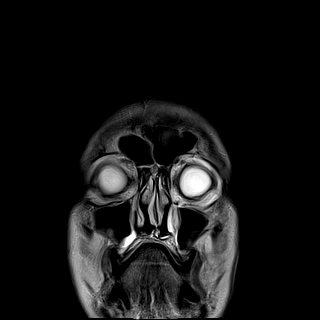
[im 28/28]
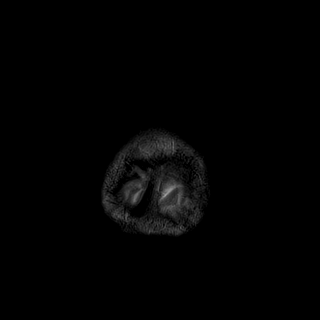

[Series 19: T1 post-contrast · coronal · 5.0mm · 0.34mm/px · 2 of 28 slices shown (1 of 2)]
[im 1/28]
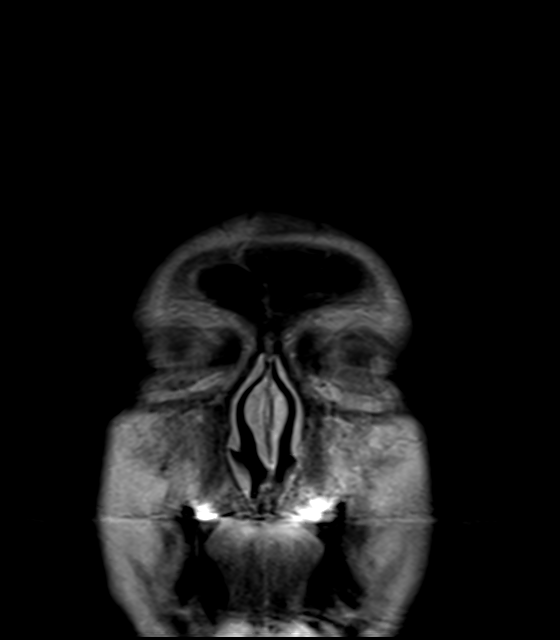
[im 28/28]
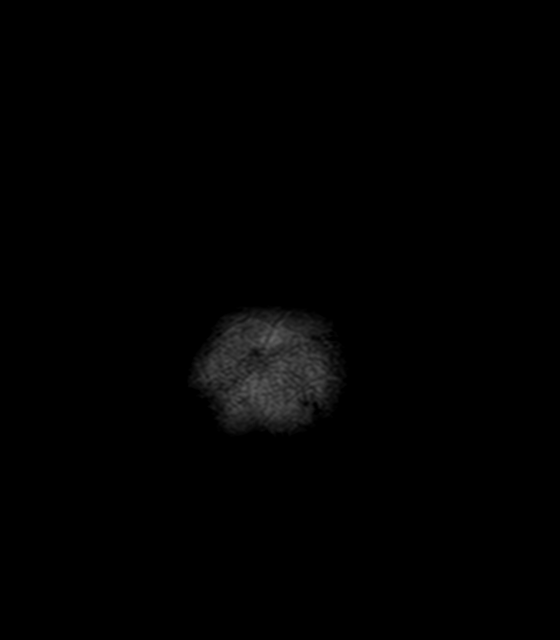

[Series 20: T1 post-contrast · sagittal · 5.0mm · 0.72mm/px · 1 of 19 slices shown (2 of 2)]
[im 1/19]
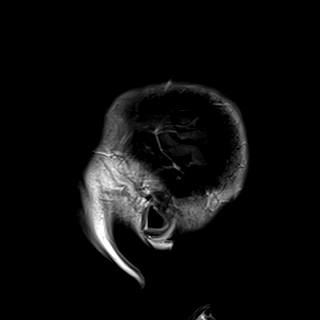

[32 of 48 positions shown; findings below may reference images not displayed]

FINDINGS: Brain: Dural based mass on the left anterior clinoid process which
measures 16 x 13 x 6 mm, not measurably changed from before. There
is a small adjacent cyst in the ventral brain. No brain edema. No
visible infiltration of the cavernous sinus or optic canal.

Mild for age chronic small vessel disease in the cerebral white
matter with a few remote microhemorrhages which do not appear
progressive from prior.

No acute or subacute infarct, acute hemorrhage, hydrocephalus, or
collection

Vascular: Absent right vertebral flow void, also seen on prior. The
left vertebral artery appears dominant.

Skull and upper cervical spine: No focal marrow lesion. Degenerative
facet spurring on both sides.

Sinuses/Orbits: Unremarkable
IMPRESSION: Size stable left anterior clinoid meningioma (16 x 13 x 6 mm) since
5344. A subcentimeter cyst has formed in the adjacent brain, but no
brain edema.
